# Patient Record
Sex: Male | Born: 1950 | Race: White | Hispanic: No | Marital: Married | Smoking: Never smoker
Health system: Southern US, Community
[De-identification: ages and names within clinical notes are randomized; demographics above are authoritative.]

## PROBLEM LIST (undated history)

## (undated) DIAGNOSIS — B029 Zoster without complications: Secondary | ICD-10-CM

## (undated) DIAGNOSIS — E785 Hyperlipidemia, unspecified: Secondary | ICD-10-CM

## (undated) DIAGNOSIS — Z8709 Personal history of other diseases of the respiratory system: Secondary | ICD-10-CM

## (undated) DIAGNOSIS — Z8619 Personal history of other infectious and parasitic diseases: Secondary | ICD-10-CM

## (undated) DIAGNOSIS — E7800 Pure hypercholesterolemia, unspecified: Secondary | ICD-10-CM

## (undated) DIAGNOSIS — R7303 Prediabetes: Secondary | ICD-10-CM

## (undated) DIAGNOSIS — G8929 Other chronic pain: Secondary | ICD-10-CM

## (undated) DIAGNOSIS — I10 Essential (primary) hypertension: Secondary | ICD-10-CM

## (undated) DIAGNOSIS — M549 Dorsalgia, unspecified: Secondary | ICD-10-CM

## (undated) DIAGNOSIS — M1990 Unspecified osteoarthritis, unspecified site: Secondary | ICD-10-CM

## (undated) DIAGNOSIS — Z87898 Personal history of other specified conditions: Secondary | ICD-10-CM

## (undated) HISTORY — DX: Zoster without complications: B02.9

## (undated) HISTORY — PX: KNEE ARTHROSCOPY: SUR90

## (undated) HISTORY — PX: EPIDURAL BLOCK INJECTION: SHX1516

## (undated) HISTORY — PX: PROSTATE BIOPSY: SHX241

## (undated) HISTORY — PX: ROTATOR CUFF REPAIR: SHX139

## (undated) HISTORY — DX: Personal history of other specified conditions: Z87.898

## (undated) HISTORY — DX: Pure hypercholesterolemia, unspecified: E78.00

## (undated) HISTORY — DX: Prediabetes: R73.03

## (undated) HISTORY — PX: COLONOSCOPY: SHX174

## (undated) HISTORY — DX: Morbid (severe) obesity due to excess calories: E66.01

---

## 1998-09-14 ENCOUNTER — Encounter: Admission: RE | Admit: 1998-09-14 | Discharge: 1998-09-14 | Payer: Self-pay | Admitting: Family Medicine

## 1999-09-27 ENCOUNTER — Encounter: Admission: RE | Admit: 1999-09-27 | Discharge: 1999-09-27 | Payer: Self-pay | Admitting: Family Medicine

## 2000-05-29 ENCOUNTER — Encounter: Admission: RE | Admit: 2000-05-29 | Discharge: 2000-05-29 | Payer: Self-pay | Admitting: Family Medicine

## 2000-05-29 ENCOUNTER — Ambulatory Visit (HOSPITAL_COMMUNITY): Admission: RE | Admit: 2000-05-29 | Discharge: 2000-05-29 | Payer: Self-pay | Admitting: Family Medicine

## 2000-05-31 ENCOUNTER — Ambulatory Visit (HOSPITAL_COMMUNITY): Admission: RE | Admit: 2000-05-31 | Discharge: 2000-05-31 | Payer: Self-pay | Admitting: Family Medicine

## 2000-07-05 ENCOUNTER — Ambulatory Visit (HOSPITAL_BASED_OUTPATIENT_CLINIC_OR_DEPARTMENT_OTHER): Admission: RE | Admit: 2000-07-05 | Discharge: 2000-07-06 | Payer: Self-pay | Admitting: Orthopedic Surgery

## 2002-04-26 ENCOUNTER — Encounter: Admission: RE | Admit: 2002-04-26 | Discharge: 2002-04-26 | Payer: Self-pay | Admitting: Family Medicine

## 2003-05-23 ENCOUNTER — Emergency Department (HOSPITAL_COMMUNITY): Admission: EM | Admit: 2003-05-23 | Discharge: 2003-05-23 | Payer: Self-pay | Admitting: Emergency Medicine

## 2003-05-26 ENCOUNTER — Encounter: Admission: RE | Admit: 2003-05-26 | Discharge: 2003-05-26 | Payer: Self-pay | Admitting: Family Medicine

## 2003-06-02 ENCOUNTER — Encounter: Admission: RE | Admit: 2003-06-02 | Discharge: 2003-06-02 | Payer: Self-pay | Admitting: Family Medicine

## 2003-09-19 ENCOUNTER — Encounter: Admission: RE | Admit: 2003-09-19 | Discharge: 2003-09-19 | Payer: Self-pay | Admitting: Family Medicine

## 2004-05-31 ENCOUNTER — Encounter: Admission: RE | Admit: 2004-05-31 | Discharge: 2004-05-31 | Payer: Self-pay | Admitting: Sports Medicine

## 2004-06-14 ENCOUNTER — Encounter: Admission: RE | Admit: 2004-06-14 | Discharge: 2004-06-14 | Payer: Self-pay | Admitting: Family Medicine

## 2004-06-16 ENCOUNTER — Encounter: Admission: RE | Admit: 2004-06-16 | Discharge: 2004-06-16 | Payer: Self-pay | Admitting: Family Medicine

## 2005-01-14 ENCOUNTER — Ambulatory Visit: Payer: Self-pay | Admitting: Family Medicine

## 2005-07-11 ENCOUNTER — Ambulatory Visit: Payer: Self-pay | Admitting: Family Medicine

## 2007-02-01 DIAGNOSIS — E7800 Pure hypercholesterolemia, unspecified: Secondary | ICD-10-CM | POA: Insufficient documentation

## 2007-02-01 DIAGNOSIS — IMO0002 Reserved for concepts with insufficient information to code with codable children: Secondary | ICD-10-CM | POA: Insufficient documentation

## 2007-02-01 DIAGNOSIS — J309 Allergic rhinitis, unspecified: Secondary | ICD-10-CM | POA: Insufficient documentation

## 2007-02-01 DIAGNOSIS — M1990 Unspecified osteoarthritis, unspecified site: Secondary | ICD-10-CM | POA: Insufficient documentation

## 2007-07-10 ENCOUNTER — Ambulatory Visit (HOSPITAL_COMMUNITY): Admission: RE | Admit: 2007-07-10 | Discharge: 2007-07-11 | Payer: Self-pay | Admitting: Orthopedic Surgery

## 2008-07-15 ENCOUNTER — Encounter: Payer: Self-pay | Admitting: Family Medicine

## 2009-07-20 ENCOUNTER — Telehealth (INDEPENDENT_AMBULATORY_CARE_PROVIDER_SITE_OTHER): Payer: Self-pay | Admitting: *Deleted

## 2011-04-19 NOTE — Op Note (Signed)
NAME:  LAYDEN, CATERINO NO.:  [id number]   MEDICAL RECORD NO.:  [id number]          PATIENT TYPE:  AMB   LOCATION:  SDS                          FACILITY:  [facility]   PHYSICIAN:  Burnard Bunting, M.D.    DATE OF BIRTH:  October 03, 1951   DATE OF PROCEDURE:  07/10/2007  DATE OF DISCHARGE:                               OPERATIVE REPORT   PREOPERATIVE DIAGNOSES:  1. Right shoulder rotator cuff tear.  2. Synovitis in retained biceps stump within the joint.  3. Bursitis.   POSTOPERATIVE DIAGNOSES:  1. Right shoulder degenerative labral tearing with retained biceps      tendon stump.  2. Synovitis.  3. Subacromial bursitis.  4. Tear of the supraspinatus and subscapularis.   PROCEDURE:  Right shoulder diagnostic intraoperative arthroscopy,  extensive debridement of intra-articular space.  Open subscapularis  tendon repair via deltopectoral approach.  Open supraspinatus repair of  the superior deltoid splitting approach.   SURGEON:  Burnard Bunting, M.D.   ASSISTANT:  Lanny Hurst, MD   ANESTHESIA:  General endotracheal.   ESTIMATED BLOOD LOSS:  75.   INDICATIONS:  Michael Hill is a 60 year old patient with right shoulder  supraspinatus and subscapular tear following trauma 3 months ago.  He  also has torn biceps tendon, who presents now for operative management  after explanation of risks and benefits.   OPERATIVE FINDINGS:  1. Examination under anesthesia range of motion external rotation 15      degrees abduction.  His external rotation 90, degrees of adduction      90, internal rotation 90 degrees, adduction is 70. Shoulder      stability is 1+ anterior, posterior with less than 1 cm, sulcus      none.  Operative findings.  2. Diagnostic intraoperative arthroscopy:  a) synovitis without frozen      shoulder appearance, within the intra-articular space with      significant labral fraying and degenerative tearing of the rotator      cuff, supraspinatus and  subscapular,  b)  subacromial bursitis, c)      tear of the subscapula and supraspinatus. The subscapular tear was      involved in the superior 2/3 of the tendon with about a centimeter      to 2 cm of retraction off of the lesser tuberosity, supraspinatus      tear was full thickness with about 2 cm of retraction but the      tendon was mobile.   DESCRIPTION OF PROCEDURE:  The patient was brought to the operating room  where general endotracheal anesthesia was induced.  Preoperative  antibiotics were administered.  The patient was placed in the beach  chair position with the head in neutral position.  The legs were well  padded.  Right shoulder, arm were prepped in DuraPrep solution, draped  in a sterile manner.  The operative anatomy was covered with Ioban.  Operative anatomy of the shoulder was then clamped using the  posterolateral nature margins of the acromion as well as coracoid  process.  Shoulder joint was insufflated with 20  mL of saline.  Subacromial space was insufflated with saline with epinephrine.  The  diagnostic arthroscope was performed through a posterior portal.  Extensive labral fraying, rotator cuff fraying and synovitis were noted.  These were debrided using a large radial shaver including biceps stump  in the frayed labrum and the frayed rotator cuff tear.  Articular  surfaces were intact. Subacromial space showed bursitis.  At this time a  deltopectoral approach was made following identification of the  subscapularis tear.  Skin and subcutaneous tissue were sharply divided.  Fat stripe was identified.  Deltopectoral interval was entered, cephalic  vein was mobilized laterally.  Self-retaining retractor was placed.  Care was taken to avoid retractor placement too distal within the  conjoined tendon. The rotator interval was opened.  The tranverse  humeral ligament was opened. Subscapularis was noted to be detached  along the superior 2/3 surface.  This was  repaired back to repaired bony  bed using two high 5 Corkscrew suture anchors.  Secure repair was  achieved.  Free edges were then secured into the bicipital groove with a  third cork screw anchor.  At this time the incision was thoroughly  irrigated.  Good rotational stability of the rotator cuff tear was  achieved using all 4 strands of both cork screw suture anchor sutures.  The deltopectoral interval was then closed using #1 Vicryl suture after  thorough irrigation.  The incision was closed using interrupted 2-0  Vicryl suture and a running 3-0 pull out  Prolene.  The supraspinatus  tear was next treated.  It was not accessible through the deltopectoral  approach nor was the subscapularis tear full accessible through a  superior approach.  Superior approach was then utilized.  Skin and  subcutaneous tissue were sharply divided.  Deltoid was split a measured  distance of  3.5 cm from the anterolateral margins of the acromion.  Stay suture was placed.  Self-retaining retractor was placed. The  supraspinatus tear was a tear within the tendon intrasubstance itself  with healthy portion of the tendon remaining attached to the greater  tuberosity foot print.  Two suture anchors were placed at the junction  of this tear and the tear was repaired in a water tight fashion using 4  suture strands. The knots were then tapped down using two 3.5 push offs  into the greater tuberosity.  The water tight repair was achieved and  the arm was taken through a range of motion and the tear was found to be  intact.  At this time the incision was thoroughly irrigated.  Deltoid  muscle was closed using #1 Vicryl suture.  The skin was closed using  interrupted inverted 2-0 Vicryl suture and running 3-0 pull out Prolene.  The posterior portal was closed using 3-0 nylon suture.  All incisions  were thoroughly irrigated.  Prior to closure, appropriate stressing was  placed.  Shoulder immobilizer was placed.  The  patient was extubated and  taken to the recovery room.  He tolerated the procedure well without  immediate complications.  It should be noted Dr. Zoila Shutter assistance  was required at all times during this case as a medical necessity  because of the complexity of the case with both rotator cuff tendon  tears as well as the necessity for retraction of important neurovascular  structures.      Burnard Bunting, M.D.  Electronically Signed     GSD/MEDQ  D:  07/10/2007  T:  07/11/2007  Job:  [id number] 

## 2011-04-22 NOTE — Op Note (Signed)
[facility]  Patient:    Michael Hill, Michael Hill                      MRN: [id number] Proc. Date: 07/05/00 Adm. Date:  [id number] Disc. Date: [id number] Attending:  Colbert Ewing                           Operative Report  PREOPERATIVE DIAGNOSIS: 1. Left knee degenerative medial meniscus tear with degenerative joint    disease. 2. Right knee degenerative joint disease with lateral meniscus tear.  POSTOPERATIVE DIAGNOSIS: 1. 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  Discoid lateral meniscus. 2. Right knee degenerative tearing, medial meniscus and lateral meniscus.    Grade 4 degenerative joint disease, lateral compartment.  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  OPERATION PERFORMED: 1. Left knee examination under anesthesia, arthroscopy, partial medial    meniscectomy with chondroplasty patella and medial femoral condyle. 2. Right knee examination under anesthesia, arthroscopy, chondroplasty patella    with partial medial and lateral meniscectomies.  SURGEON:  Loreta Ave, M.D.  ASSISTANT:  Arlys Deaunte D. Petrarca, P.A.-C.  ANESTHESIA:  General.  ESTIMATED BLOOD LOSS:  Minimal.  SPECIMENS:  None.  CULTURES:  None.  COMPLICATIONS:  None.  DRESSING:  Soft compressive.  DESCRIPTION OF PROCEDURE:  Patient was brought to the operating room and after adequate anesthesia had been obtained, both knees examined.  Full motion. Patellofemoral crepitus both sides.  Stable ligaments.  Tourniquet and leg holders were applied both sides.  Both prepped and draped in the usual sterile fashion.  Attention turned first to the left.  Three portals created, one superolateral, one each medial and lateral parapatellar.  Inflow catheter introduced, knee distended, arthroscope introduced and knee inspected in a serial manner.  Grade 3 chondromalacia patella and grade 3  changes medial femoral condyle debrided with a shaver to a stable surface.  ____________ articular cartilage throughout.  Extensive degenerative tearing posterior half of the medial meniscus saucerized out to the rim, tapered in at its remaining meniscus with baskets and shaver.  Lateral compartment had a discoid lateral meniscus without attrition or tears and it was left intact.  No degenerative changes.  Cruciate ligaments intact.  Patellofemoral tracking good.  Loose bodies of articular cartilage removed.  Instruments and fluid removed. Portals of the knee injected with Marcaine.  Portals closed with 4-0 nylon. When this was completed, attention was turned to the opposite right knee which also had been prepped and draped in the usual sterile fashion.  Three portals were created, one superolateral, one each medial and lateral parapatellar. Inflow catheter introduced.  Knee distended.  Arthroscope introduced and knee inspected.  Degenerative tearing medial meniscus, middle third saucerized out retaining a fair amount of meniscus retaining a fair amount of meniscus behind.  Only mild degenerative changes seen in the compartment but there was significant periarticular spurring.  Cruciate ligaments intact.  Lateral compartment had had extensive previous work with a rim of meniscus all the way around with extensive degenerative tearing throughout that rim.  Taken all the way out to the remaining rim removing all loose fragmented portions.  Grade 4 changes with exposed bone over the majority of the compartment from previous transgression and degenerative joint disease.  Loose fragments removed. Patellofemoral joint had grade 3 changes with chondromalacia and multiple chondral flaps  on the patella and troclea.  All of this tapered down smoothly, removing all chondral flaps, treated with chondroplasty, leaving a reasonable bed of articular cartilage throughout.  Again, cruciate ligaments intact.   All loose fragments removed.  Partially tethered loose bodies anteriorly also removed.  The entire knee examined, no other significant findings appreciated. Instruments and fluid removed.  Portals of the knee injected with Marcaine. Portals closed with 4-0 nylon.  Sterile compressive dressing applied to both legs.  Anesthesia reversed.  Brought to recovery room.  Tolerated surgery well.  No complications. DD:  07/05/00 TD:  07/07/00 Job: [id number]

## 2011-09-19 LAB — BASIC METABOLIC PANEL
CO2: 24
Calcium: 8.9
GFR calc Af Amer: >60
GFR calc non Af Amer: >60
Potassium: 4
Sodium: 138

## 2011-09-19 LAB — CBC
Hemoglobin: 13.9
RBC: 4.64

## 2012-01-23 ENCOUNTER — Other Ambulatory Visit: Payer: Self-pay | Admitting: Family Medicine

## 2012-01-23 DIAGNOSIS — M545 Low back pain, unspecified: Secondary | ICD-10-CM

## 2012-01-30 ENCOUNTER — Ambulatory Visit
Admission: RE | Admit: 2012-01-30 | Discharge: 2012-01-30 | Disposition: A | Discharge status: Home / Self Care | Payer: BC Managed Care – PPO | Source: Ambulatory Visit | Attending: Family Medicine | Admitting: Family Medicine

## 2012-01-30 DIAGNOSIS — M545 Low back pain, unspecified: Secondary | ICD-10-CM

## 2014-06-18 ENCOUNTER — Other Ambulatory Visit: Payer: Self-pay | Admitting: Neurosurgery

## 2014-06-19 ENCOUNTER — Other Ambulatory Visit: Payer: Self-pay

## 2014-06-19 ENCOUNTER — Telehealth: Payer: Self-pay | Admitting: Vascular Surgery

## 2014-06-19 NOTE — Telephone Encounter (Signed)
Message copied by Fredrich BirksMILLIKAN, DANA P on Thu Jun 19, 2014  2:59 PM ------      Message from: Phillips OdorPULLINS, CAROL S      Created: Thu Jun 19, 2014  1:19 PM      Regarding: needs new patient consult/ [facility]       Please schedule this pt. with [facility] for new pt. Consult, prior to ALIF on 08/05/14; please remind him to bring a copy of the LS spine film with him to the appt.  Thanks.  ------

## 2014-06-19 NOTE — Telephone Encounter (Signed)
Lm for pt to cb for appt information, dpm

## 2014-07-22 ENCOUNTER — Encounter: Payer: Self-pay | Admitting: Vascular Surgery

## 2014-07-23 ENCOUNTER — Encounter: Payer: Self-pay | Admitting: Vascular Surgery

## 2014-07-23 ENCOUNTER — Ambulatory Visit (INDEPENDENT_AMBULATORY_CARE_PROVIDER_SITE_OTHER): Payer: BC Managed Care – PPO | Admitting: Vascular Surgery

## 2014-07-23 VITALS — BP 138/86 | HR 69 | Ht 67.0 in | Wt 215.4 lb

## 2014-07-23 DIAGNOSIS — M5136 Other intervertebral disc degeneration, lumbar region: Secondary | ICD-10-CM | POA: Insufficient documentation

## 2014-07-23 DIAGNOSIS — M5137 Other intervertebral disc degeneration, lumbosacral region: Secondary | ICD-10-CM

## 2014-07-23 NOTE — Assessment & Plan Note (Signed)
The patient appears to be a good candidate for anterior retroperitoneal exposure of L5-S1. I have reviewed our role in exposure of the spine in order to allow anterior lumbar interbody fusion at the appropriate levels. We have discussed the potential complications of surgery, including but not limited to, arterial or venous injury, thrombosis, or bleeding. We have also discussed the potential risks of wound healing problems, the development of a hernia, nerve injury, leg swelling, or other unpredictable medical problems. I've also explained that for the L5-S1 level there is a small risk of retrograde ejaculation. All the patient's questions were answered and they are agreeable to proceed. The surgery is scheduled for 08/05/2014.

## 2014-07-23 NOTE — Progress Notes (Signed)
Patient ID: Michael Hill, male   DOB: [birth year], 63 y.o.   MRN: [id number]  Reason for Consult: Evaluate for ALIF. Referred by Dr. Maeola Harman   Referred by No ref. provider found  Subjective:     HPI:  Michael Hill is a 63 y.o. male who has a long history of back pain. He had multiple sports injuries when he was younger. Approximately 2 years ago he injured his back lifting something and has had significant back pain since that time. He has tried physical therapy injection therapy and medications without significant relief. I've been asked to provide anterior retroperitoneal exposure of L5-S1. Dr. Venetia Maxon plans on the dressing multiple levels from a different approach also.  Patient's risk factors for vascular disease and hypertension and hypercholesterolemia. In addition he has a family history of vascular disease. He does not smoke tobacco.  History reviewed. No pertinent past medical history. Family History  Problem Relation Age of Onset  . Heart disease Father     before age 2  . Hyperlipidemia Father   . Hypertension Father   . Hypertension Brother   . Heart disease Brother     before age 63  . Cancer Daughter    History reviewed. No pertinent past surgical history.  Short Social History:  History  Substance Use Topics  . Smoking status: Never Smoker   . Smokeless tobacco: Never Used  . Alcohol Use: No   No Known Allergies  Current Outpatient Prescriptions  Medication Sig Dispense Refill  . amLODipine-benazepril (LOTREL) 5-10 MG per capsule Take 1 capsule by mouth daily.      Marland Kitchen HYDROcodone-acetaminophen (NORCO/VICODIN) 5-325 MG per tablet Take 1 tablet by mouth every 8 (eight) hours.       No current facility-administered medications for this visit.   Review of Systems  Constitutional: Negative for chills and fever.  Eyes: Negative for loss of vision.  Respiratory: Negative for cough and wheezing.  Cardiovascular: Negative for chest pain, chest  tightness, claudication, dyspnea with exertion, orthopnea and palpitations.  GI: Negative for blood in stool and vomiting.  GU: Negative for dysuria and hematuria.  Musculoskeletal: Negative for leg pain, joint pain and myalgias.  Skin: Negative for rash and wound.  Neurological: Negative for dizziness and speech difficulty.  Hematologic: Negative for bruises/bleeds easily. Psychiatric: Negative for depressed mood.       Objective:  Objective  Filed Vitals:   07/23/14 0904  BP: 138/86  Pulse: 69  Height:  (1.702 m)  Weight: 215 lb 6.4 oz (97.705 kg)  SpO2: 97%   Body mass index is 33.73 kg/(m^2).  Physical Exam  Constitutional: He is oriented to person, place, and time. He appears well-developed and well-nourished.  HENT:  Head: Normocephalic and atraumatic.  Neck: Neck supple. No JVD present. No thyromegaly present.  Cardiovascular: Normal rate, regular rhythm and normal heart sounds.  Exam reveals no friction rub.   No murmur heard. Pulses:      Femoral pulses are 2+ on the right side, and 2+ on the left side.      Popliteal pulses are 2+ on the right side, and 2+ on the left side.  Pulmonary/Chest: Breath sounds normal. He has no wheezes. He has no rales.  Abdominal: Soft. Bowel sounds are normal. There is no tenderness.  I do not palpate an aneurysm.  Musculoskeletal: Normal range of motion. He exhibits no edema.  Lymphadenopathy:    He has no cervical adenopathy.  Neurological: He is  alert and oriented to person, place, and time. He has normal strength. No sensory deficit.  Skin: No lesion and no rash noted.  Psychiatric: He has a normal mood and affect.   Data: I reviewed his previous MRI. This was in 2013.     Assessment/Plan:    DDD (degenerative disc disease), lumbar The patient appears to be a good candidate for anterior retroperitoneal exposure of L5-S1. I have reviewed our role in exposure of the spine in order to allow anterior lumbar interbody  fusion at the appropriate levels. We have discussed the potential complications of surgery, including but not limited to, arterial or venous injury, thrombosis, or bleeding. We have also discussed the potential risks of wound healing problems, the development of a hernia, nerve injury, leg swelling, or other unpredictable medical problems. I've also explained that for the L5-S1 level there is a small risk of retrograde ejaculation. All the patient's questions were answered and they are agreeable to proceed. The surgery is scheduled for 08/05/2014.   Chuck Hint MD [facility]

## 2014-07-24 ENCOUNTER — Encounter (HOSPITAL_COMMUNITY): Payer: Self-pay | Admitting: Pharmacy Technician

## 2014-07-28 ENCOUNTER — Encounter (HOSPITAL_COMMUNITY)
Admission: RE | Admit: 2014-07-28 | Discharge: 2014-07-28 | Disposition: A | Discharge status: Home / Self Care | Payer: BC Managed Care – PPO | Source: Ambulatory Visit | Attending: Neurosurgery | Admitting: Neurosurgery

## 2014-07-28 ENCOUNTER — Other Ambulatory Visit (HOSPITAL_COMMUNITY): Payer: BC Managed Care – PPO

## 2014-07-28 ENCOUNTER — Ambulatory Visit (HOSPITAL_COMMUNITY)
Admission: RE | Admit: 2014-07-28 | Discharge: 2014-07-28 | Disposition: A | Discharge status: Home / Self Care | Payer: BC Managed Care – PPO | Source: Ambulatory Visit | Attending: Anesthesiology | Admitting: Anesthesiology

## 2014-07-28 ENCOUNTER — Encounter (HOSPITAL_COMMUNITY): Payer: Self-pay

## 2014-07-28 DIAGNOSIS — I10 Essential (primary) hypertension: Secondary | ICD-10-CM | POA: Insufficient documentation

## 2014-07-28 HISTORY — DX: Personal history of other infectious and parasitic diseases: Z86.19

## 2014-07-28 HISTORY — DX: Personal history of other diseases of the respiratory system: Z87.09

## 2014-07-28 HISTORY — DX: Essential (primary) hypertension: I10

## 2014-07-28 HISTORY — DX: Dorsalgia, unspecified: M54.9

## 2014-07-28 HISTORY — DX: Other chronic pain: G89.29

## 2014-07-28 HISTORY — DX: Hyperlipidemia, unspecified: E78.5

## 2014-07-28 LAB — SURGICAL PCR SCREEN
MRSA, PCR: NEGATIVE
Staphylococcus aureus: POSITIVE — AB

## 2014-07-28 LAB — BASIC METABOLIC PANEL
ANION GAP: 12 (ref 5–15)
BUN: 24 mg/dL — ABNORMAL HIGH (ref 6–23)
CHLORIDE: 106 meq/L (ref 96–112)
CO2: 24 mEq/L (ref 19–32)
Calcium: 9.3 mg/dL (ref 8.4–10.5)
Creatinine, Ser: 0.99 mg/dL (ref 0.50–1.35)
GFR calc Af Amer: >90 mL/min (ref >90)
GFR calc non Af Amer: 86 mL/min — ABNORMAL LOW (ref >90)
GLUCOSE: 113 mg/dL — AB (ref 70–99)
POTASSIUM: 4.7 meq/L (ref 3.7–5.3)
SODIUM: 142 meq/L (ref 137–147)

## 2014-07-28 LAB — CBC
HEMATOCRIT: 42.5 % (ref 39.0–52.0)
HEMOGLOBIN: 14.7 g/dL (ref 13.0–17.0)
MCH: 30.2 pg (ref 26.0–34.0)
MCHC: 34.6 g/dL (ref 30.0–36.0)
MCV: 87.4 fL (ref 78.0–100.0)
Platelets: 200 10*3/uL (ref 150–400)
RBC: 4.86 MIL/uL (ref 4.22–5.81)
RDW: 12.5 % (ref 11.5–15.5)
WBC: 4.8 10*3/uL (ref 4.0–10.5)

## 2014-07-28 LAB — TYPE AND SCREEN
ABO/RH(D): A POS
ANTIBODY SCREEN: NEGATIVE

## 2014-07-28 LAB — ABO/RH: ABO/RH(D): A POS

## 2014-07-28 MED ORDER — CHLORHEXIDINE GLUCONATE 4 % EX LIQD
60.0000 mL | Freq: Once | CUTANEOUS | Status: DC
Start: 2014-07-28 — End: 2014-07-29

## 2014-07-28 MED ORDER — CHLORHEXIDINE GLUCONATE 4 % EX LIQD: 60.0000 mL | Freq: Once | CUTANEOUS | Status: DC

## 2014-07-28 NOTE — Progress Notes (Signed)
Mupirocin ointment Rx called into [facility] in [place] for positive PCR of staph. Left message on pt's voicemail notifying him of positive staph.

## 2014-07-28 NOTE — Pre-Procedure Instructions (Signed)
Michael Hill  07/28/2014   Your procedure is scheduled on:  Tues, Sept 1 @ 7:30 AM  Report to [facility] Entrance A  at 5:30 AM.  Call this number if you have problems the morning of surgery: [phone]   Remember:   Do not eat food or drink liquids after midnight.   Take these medicines the morning of surgery with A SIP OF WATER: Pain Pill(if needed) and Amlodipine-Benazepril(Lotrel)              No Goody's,BC's,Aleve,Aspirin,Ibuprofen,FIsh Oil,or any Herbal Mediations   Do not wear jewelry  Do not wear lotions, powders, or colognes. You may wear deodorant.  Men may shave face and neck.  Do not bring valuables to the hospital.  [facility] is not responsible                  for any belongings or valuables.               Contacts, dentures or bridgework may not be worn into surgery.  Leave suitcase in the car. After surgery it may be brought to your room.  For patients admitted to the hospital, discharge time is determined by your                treatment team.             Special Instructions:  [facility] - Preparing for Surgery  Before surgery, you can play an important role.  Because skin is not sterile, your skin needs to be as free of germs as possible.  You can reduce the number of germs on you skin by washing with CHG (chlorahexidine gluconate) soap before surgery.  CHG is an antiseptic cleaner which kills germs and bonds with the skin to continue killing germs even after washing.  Please DO NOT use if you have an allergy to CHG or antibacterial soaps.  If your skin becomes reddened/irritated stop using the CHG and inform your nurse when you arrive at Short Stay.  Do not shave (including legs and underarms) for at least 48 hours prior to the first CHG shower.  You may shave your face.  Please follow these instructions carefully:   1.  Shower with CHG Soap the night before surgery and the                                morning of Surgery.  2.  If you choose to wash your  hair, wash your hair first as usual with your       normal shampoo.  3.  After you shampoo, rinse your hair and body thoroughly to remove the                      Shampoo.  4.  Use CHG as you would any other liquid soap.  You can apply chg directly       to the skin and wash gently with scrungie or a clean washcloth.  5.  Apply the CHG Soap to your body ONLY FROM THE NECK DOWN.        Do not use on open wounds or open sores.  Avoid contact with your eyes,       ears, mouth and genitals (private parts).  Wash genitals (private parts)       with your normal soap.  6.  Wash thoroughly, paying special attention to  the area where your surgery        will be performed.  7.  Thoroughly rinse your body with warm water from the neck down.  8.  DO NOT shower/wash with your normal soap after using and rinsing off       the CHG Soap.  9.  Pat yourself dry with a clean towel.            10.  Wear clean pajamas.            11.  Place clean sheets on your bed the night of your first shower and do not        sleep with pets.  Day of Surgery  Do not apply any lotions/deoderants the morning of surgery.  Please wear clean clothes to the hospital/surgery center.     Please read over the following fact sheets that you were given: Pain Booklet, Coughing and Deep Breathing, Blood Transfusion Information, MRSA Information and Surgical Site Infection Prevention

## 2014-07-28 NOTE — Progress Notes (Signed)
07/28/14 1433  OBSTRUCTIVE SLEEP APNEA  Have you ever been diagnosed with sleep apnea through a sleep study? No  Do you snore loudly (loud enough to be heard through closed doors)?  0  Do you often feel tired, fatigued, or sleepy during the daytime? 0  Has anyone observed you stop breathing during your sleep? 0  Do you have, or are you being treated for high blood pressure? 1  BMI more than 35 kg/m2? 0  Age over 63 years old? 1  Neck circumference greater than 40 cm/16 inches? 1  Gender: 1  Obstructive Sleep Apnea Score 4  Score 4 or greater  Results sent to PCP

## 2014-07-28 NOTE — Progress Notes (Signed)
Pt doesn't have a cardiologist  Denies ever having an echo/stress test/heart cath  Medical Md is Dr.David Azucena Cecil   Denies EKG or CXR in past yr

## 2014-07-29 NOTE — Progress Notes (Signed)
Anesthesia Chart Review:  Patient is a 63 year old male scheduled for L5-S1 ALIF on 08/05/14 by Dr. Venetia Maxon.  Anterior exposure by Dr. Edilia Bo.  History includes non-smoker, HTN, HLD, chronic back pain. BMI is consistent with obesity. OSA screening score is 4.  PCP is Dr. Tally Joe.   EKG on 07/28/14 showed: SR with PACs, inferior infarct, age undetermined (Q waves in III and aVF).  The interpreting cardiologist did not feel that his EKG was significantly changed when compared to prior preoperative tracing on 07/10/07 (copy printed and placed on chart).  He denied prior echo, stress, or cath.  Preoperative CXR and labs noted.   Patient has no reported MI/CHF, smoking, or DM history. His EKG has been stable since at least 2008.  No CV symptoms documented at his PAT visit.  If no acute changes or new CV symptoms then I would anticipate that he could proceed as planned.  Velna Ochs [facility] Short Stay Center/Anesthesiology Phone [phone] 07/29/2014 1:28 PM

## 2014-08-04 MED ORDER — CEFAZOLIN SODIUM-DEXTROSE 2-3 GM-% IV SOLR
2.0000 g | INTRAVENOUS | Status: AC
  Administered 2014-08-05 (×2): 2 g via INTRAVENOUS
  Filled 2014-08-04: qty 50

## 2014-08-05 ENCOUNTER — Inpatient Hospital Stay (HOSPITAL_COMMUNITY): Payer: BC Managed Care – PPO

## 2014-08-05 ENCOUNTER — Encounter (HOSPITAL_COMMUNITY): Payer: BC Managed Care – PPO | Admitting: Vascular Surgery

## 2014-08-05 ENCOUNTER — Encounter (HOSPITAL_COMMUNITY): Payer: Self-pay | Admitting: *Deleted

## 2014-08-05 ENCOUNTER — Inpatient Hospital Stay (HOSPITAL_COMMUNITY): Payer: BC Managed Care – PPO | Admitting: Anesthesiology

## 2014-08-05 ENCOUNTER — Encounter (HOSPITAL_COMMUNITY)
Admission: RE | Disposition: A | Discharge status: Home Health | Payer: BC Managed Care – PPO | Source: Ambulatory Visit | Attending: Neurosurgery

## 2014-08-05 ENCOUNTER — Inpatient Hospital Stay (HOSPITAL_COMMUNITY)
Admission: RE | Admit: 2014-08-05 | Discharge: 2014-08-11 | DRG: 460 | Disposition: A | Discharge status: Home Health | Payer: BC Managed Care – PPO | Source: Ambulatory Visit | Attending: Neurosurgery | Admitting: Neurosurgery

## 2014-08-05 DIAGNOSIS — M549 Dorsalgia, unspecified: Secondary | ICD-10-CM | POA: Diagnosis present

## 2014-08-05 DIAGNOSIS — M4120 Other idiopathic scoliosis, site unspecified: Secondary | ICD-10-CM | POA: Diagnosis present

## 2014-08-05 DIAGNOSIS — M5136 Other intervertebral disc degeneration, lumbar region: Secondary | ICD-10-CM

## 2014-08-05 DIAGNOSIS — M419 Scoliosis, unspecified: Secondary | ICD-10-CM | POA: Diagnosis present

## 2014-08-05 DIAGNOSIS — E7800 Pure hypercholesterolemia, unspecified: Secondary | ICD-10-CM | POA: Diagnosis present

## 2014-08-05 DIAGNOSIS — R030 Elevated blood-pressure reading, without diagnosis of hypertension: Secondary | ICD-10-CM | POA: Diagnosis present

## 2014-08-05 DIAGNOSIS — M5126 Other intervertebral disc displacement, lumbar region: Secondary | ICD-10-CM | POA: Diagnosis present

## 2014-08-05 DIAGNOSIS — R339 Retention of urine, unspecified: Secondary | ICD-10-CM | POA: Diagnosis present

## 2014-08-05 DIAGNOSIS — M47817 Spondylosis without myelopathy or radiculopathy, lumbosacral region: Principal | ICD-10-CM | POA: Diagnosis present

## 2014-08-05 DIAGNOSIS — M48061 Spinal stenosis, lumbar region without neurogenic claudication: Secondary | ICD-10-CM | POA: Diagnosis not present

## 2014-08-05 HISTORY — PX: ANTERIOR LAT LUMBAR FUSION: SHX1168

## 2014-08-05 HISTORY — PX: ABDOMINAL EXPOSURE: SHX5708

## 2014-08-05 HISTORY — PX: LUMBAR FUSION: SHX111

## 2014-08-05 HISTORY — DX: Unspecified osteoarthritis, unspecified site: M19.90

## 2014-08-05 HISTORY — PX: ANTERIOR LUMBAR FUSION: SHX1170

## 2014-08-05 SURGERY — ANTERIOR LUMBAR FUSION 1 LEVEL
Anesthesia: General | Site: Spine Lumbar | Laterality: Right

## 2014-08-05 MED ORDER — CEFAZOLIN SODIUM 1-5 GM-% IV SOLN
1.0000 g | Freq: Three times a day (TID) | INTRAVENOUS | Status: AC
  Administered 2014-08-05 – 2014-08-06 (×2): 1 g via INTRAVENOUS
  Filled 2014-08-05 (×2): qty 50

## 2014-08-05 MED ORDER — SODIUM CHLORIDE 0.9 % IV SOLN: 250.0000 mL | INTRAVENOUS | Status: DC

## 2014-08-05 MED ORDER — DIPHENHYDRAMINE HCL 50 MG/ML IJ SOLN: 12.5000 mg | Freq: Four times a day (QID) | INTRAMUSCULAR | Status: DC | PRN

## 2014-08-05 MED ORDER — SUFENTANIL CITRATE 50 MCG/ML IV SOLN
INTRAVENOUS | Status: AC
  Filled 2014-08-05: qty 1

## 2014-08-05 MED ORDER — ONDANSETRON HCL 4 MG/2ML IJ SOLN
INTRAMUSCULAR | Status: AC
  Filled 2014-08-05: qty 2

## 2014-08-05 MED ORDER — OXYCODONE-ACETAMINOPHEN 5-325 MG PO TABS
1.0000 | ORAL_TABLET | ORAL | Status: DC | PRN
  Administered 2014-08-07 – 2014-08-08 (×5): 2 via ORAL
  Administered 2014-08-08: 1 via ORAL
  Administered 2014-08-08: 2 via ORAL
  Administered 2014-08-08: 1 via ORAL
  Administered 2014-08-09 – 2014-08-11 (×11): 2 via ORAL
  Filled 2014-08-05 (×3): qty 2
  Filled 2014-08-05: qty 1
  Filled 2014-08-05 (×12): qty 2
  Filled 2014-08-05: qty 1
  Filled 2014-08-05 (×3): qty 2

## 2014-08-05 MED ORDER — ALUM & MAG HYDROXIDE-SIMETH 200-200-20 MG/5ML PO SUSP: 30.0000 mL | Freq: Four times a day (QID) | ORAL | Status: DC | PRN

## 2014-08-05 MED ORDER — LACTATED RINGERS IV SOLN
INTRAVENOUS | Status: DC | PRN
  Administered 2014-08-05 (×3): via INTRAVENOUS

## 2014-08-05 MED ORDER — ROCURONIUM BROMIDE 100 MG/10ML IV SOLN
INTRAVENOUS | Status: DC | PRN
  Administered 2014-08-05: 20 mg via INTRAVENOUS
  Administered 2014-08-05: 10 mg via INTRAVENOUS
  Administered 2014-08-05 (×2): 20 mg via INTRAVENOUS
  Administered 2014-08-05: 60 mg via INTRAVENOUS

## 2014-08-05 MED ORDER — LIDOCAINE HCL (CARDIAC) 20 MG/ML IV SOLN
INTRAVENOUS | Status: AC
  Filled 2014-08-05: qty 5

## 2014-08-05 MED ORDER — PHENYLEPHRINE HCL 10 MG/ML IJ SOLN
10.0000 mg | INTRAVENOUS | Status: DC | PRN
  Administered 2014-08-05: 20 ug/min via INTRAVENOUS

## 2014-08-05 MED ORDER — METHOCARBAMOL 500 MG PO TABS
500.0000 mg | ORAL_TABLET | Freq: Four times a day (QID) | ORAL | Status: DC | PRN
  Administered 2014-08-10 – 2014-08-11 (×2): 500 mg via ORAL
  Filled 2014-08-05 (×2): qty 1

## 2014-08-05 MED ORDER — ONDANSETRON HCL 4 MG/2ML IJ SOLN
4.0000 mg | INTRAMUSCULAR | Status: DC | PRN
  Administered 2014-08-05: 4 mg via INTRAVENOUS
  Filled 2014-08-05: qty 2

## 2014-08-05 MED ORDER — ONDANSETRON HCL 4 MG/2ML IJ SOLN: 4.0000 mg | Freq: Four times a day (QID) | INTRAMUSCULAR | Status: DC | PRN

## 2014-08-05 MED ORDER — THROMBIN 5000 UNITS EX SOLR
CUTANEOUS | Status: DC | PRN
  Administered 2014-08-05 (×2): 5000 [IU] via TOPICAL

## 2014-08-05 MED ORDER — NALOXONE HCL 0.4 MG/ML IJ SOLN: 0.4000 mg | INTRAMUSCULAR | Status: DC | PRN

## 2014-08-05 MED ORDER — ROCURONIUM BROMIDE 50 MG/5ML IV SOLN
INTRAVENOUS | Status: AC
  Filled 2014-08-05: qty 2

## 2014-08-05 MED ORDER — NEOSTIGMINE METHYLSULFATE 10 MG/10ML IV SOLN
INTRAVENOUS | Status: AC
  Filled 2014-08-05: qty 1

## 2014-08-05 MED ORDER — HYDROMORPHONE HCL PF 1 MG/ML IJ SOLN
0.2500 mg | INTRAMUSCULAR | Status: DC | PRN
  Administered 2014-08-05 (×4): 0.5 mg via INTRAVENOUS

## 2014-08-05 MED ORDER — HYDROMORPHONE 0.3 MG/ML IV SOLN
INTRAVENOUS | Status: DC
  Administered 2014-08-05: 21:00:00 via INTRAVENOUS
  Administered 2014-08-06 (×2): 1.8 mg via INTRAVENOUS
  Administered 2014-08-06: 1.5 mg via INTRAVENOUS
  Administered 2014-08-06: 3 mg via INTRAVENOUS
  Administered 2014-08-06: 0.3 mg via INTRAVENOUS
  Administered 2014-08-06: 2.4 mg via INTRAVENOUS
  Administered 2014-08-07: 3.3 mg via INTRAVENOUS
  Filled 2014-08-05 (×3): qty 25

## 2014-08-05 MED ORDER — DOCUSATE SODIUM 100 MG PO CAPS
100.0000 mg | ORAL_CAPSULE | Freq: Two times a day (BID) | ORAL | Status: DC
  Administered 2014-08-05 – 2014-08-11 (×11): 100 mg via ORAL
  Filled 2014-08-05 (×11): qty 1

## 2014-08-05 MED ORDER — PHENOL 1.4 % MT LIQD: 1.0000 | OROMUCOSAL | Status: DC | PRN

## 2014-08-05 MED ORDER — ROCURONIUM BROMIDE 50 MG/5ML IV SOLN
INTRAVENOUS | Status: AC
  Filled 2014-08-05: qty 1

## 2014-08-05 MED ORDER — PROMETHAZINE HCL 25 MG/ML IJ SOLN: 6.2500 mg | INTRAMUSCULAR | Status: DC | PRN

## 2014-08-05 MED ORDER — DIPHENHYDRAMINE HCL 12.5 MG/5ML PO ELIX: 12.5000 mg | ORAL_SOLUTION | Freq: Four times a day (QID) | ORAL | Status: DC | PRN

## 2014-08-05 MED ORDER — GLYCOPYRROLATE 0.2 MG/ML IJ SOLN
INTRAMUSCULAR | Status: DC | PRN
  Administered 2014-08-05: 0.4 mg via INTRAVENOUS
  Administered 2014-08-05: 0.2 mg via INTRAVENOUS

## 2014-08-05 MED ORDER — ONDANSETRON HCL 4 MG/2ML IJ SOLN
INTRAMUSCULAR | Status: DC | PRN
  Administered 2014-08-05: 4 mg via INTRAVENOUS

## 2014-08-05 MED ORDER — PANTOPRAZOLE SODIUM 40 MG IV SOLR
40.0000 mg | Freq: Every day | INTRAVENOUS | Status: DC
  Administered 2014-08-05 – 2014-08-07 (×3): 40 mg via INTRAVENOUS
  Filled 2014-08-05 (×3): qty 40

## 2014-08-05 MED ORDER — KCL IN DEXTROSE-NACL 20-5-0.45 MEQ/L-%-% IV SOLN
INTRAVENOUS | Status: DC
  Administered 2014-08-06: 1000 mL via INTRAVENOUS
  Administered 2014-08-06: 10:00:00 via INTRAVENOUS
  Filled 2014-08-05 (×3): qty 1000

## 2014-08-05 MED ORDER — SENNA 8.6 MG PO TABS
1.0000 | ORAL_TABLET | Freq: Two times a day (BID) | ORAL | Status: DC
  Administered 2014-08-05 – 2014-08-11 (×11): 8.6 mg via ORAL
  Filled 2014-08-05 (×11): qty 1

## 2014-08-05 MED ORDER — HEMOSTATIC AGENTS (NO CHARGE) OPTIME
TOPICAL | Status: DC | PRN
  Administered 2014-08-05: 1 via TOPICAL

## 2014-08-05 MED ORDER — PROPOFOL 10 MG/ML IV BOLUS
INTRAVENOUS | Status: AC
  Filled 2014-08-05: qty 20

## 2014-08-05 MED ORDER — SODIUM CHLORIDE 0.9 % IJ SOLN: 3.0000 mL | INTRAMUSCULAR | Status: DC | PRN

## 2014-08-05 MED ORDER — LIDOCAINE HCL (CARDIAC) 20 MG/ML IV SOLN
INTRAVENOUS | Status: DC | PRN
  Administered 2014-08-05: 100 mg via INTRAVENOUS

## 2014-08-05 MED ORDER — LACTATED RINGERS IV SOLN
INTRAVENOUS | Status: DC | PRN
  Administered 2014-08-05: 07:00:00 via INTRAVENOUS

## 2014-08-05 MED ORDER — EPHEDRINE SULFATE 50 MG/ML IJ SOLN
INTRAMUSCULAR | Status: DC | PRN
  Administered 2014-08-05: 10 mg via INTRAVENOUS
  Administered 2014-08-05: 5 mg via INTRAVENOUS
  Administered 2014-08-05: 10 mg via INTRAVENOUS

## 2014-08-05 MED ORDER — FENTANYL CITRATE 0.05 MG/ML IJ SOLN
INTRAMUSCULAR | Status: AC
  Filled 2014-08-05: qty 5

## 2014-08-05 MED ORDER — MIDAZOLAM HCL 2 MG/2ML IJ SOLN
INTRAMUSCULAR | Status: AC
  Filled 2014-08-05: qty 2

## 2014-08-05 MED ORDER — PROPOFOL INFUSION 10 MG/ML OPTIME
INTRAVENOUS | Status: DC | PRN
  Administered 2014-08-05: 50 ug/kg/min via INTRAVENOUS

## 2014-08-05 MED ORDER — ATORVASTATIN CALCIUM 10 MG PO TABS
10.0000 mg | ORAL_TABLET | Freq: Every day | ORAL | Status: DC
  Administered 2014-08-06 – 2014-08-10 (×5): 10 mg via ORAL
  Filled 2014-08-05 (×5): qty 1

## 2014-08-05 MED ORDER — MENTHOL 3 MG MT LOZG: 1.0000 | LOZENGE | OROMUCOSAL | Status: DC | PRN

## 2014-08-05 MED ORDER — SODIUM CHLORIDE 0.9 % IJ SOLN
3.0000 mL | Freq: Two times a day (BID) | INTRAMUSCULAR | Status: DC
  Administered 2014-08-06 – 2014-08-10 (×7): 3 mL via INTRAVENOUS

## 2014-08-05 MED ORDER — HYDROCODONE-ACETAMINOPHEN 5-325 MG PO TABS
1.0000 | ORAL_TABLET | Freq: Three times a day (TID) | ORAL | Status: DC
Start: 2014-08-05 — End: 2014-08-08
  Administered 2014-08-05 – 2014-08-08 (×9): 1 via ORAL
  Filled 2014-08-05 (×8): qty 1

## 2014-08-05 MED ORDER — MIDAZOLAM HCL 5 MG/5ML IJ SOLN
INTRAMUSCULAR | Status: DC | PRN
  Administered 2014-08-05: 2 mg via INTRAVENOUS

## 2014-08-05 MED ORDER — ARTIFICIAL TEARS OP OINT
TOPICAL_OINTMENT | OPHTHALMIC | Status: AC
  Filled 2014-08-05: qty 3.5

## 2014-08-05 MED ORDER — NEOSTIGMINE METHYLSULFATE 10 MG/10ML IV SOLN
INTRAVENOUS | Status: DC | PRN
  Administered 2014-08-05: 3 mg via INTRAVENOUS

## 2014-08-05 MED ORDER — POLYETHYLENE GLYCOL 3350 17 G PO PACK
17.0000 g | PACK | Freq: Every day | ORAL | Status: DC | PRN
  Administered 2014-08-10: 17 g via ORAL
  Filled 2014-08-05: qty 1

## 2014-08-05 MED ORDER — HYDROCODONE-ACETAMINOPHEN 5-325 MG PO TABS
1.0000 | ORAL_TABLET | ORAL | Status: DC | PRN
Start: 2014-08-05 — End: 2014-08-11
  Administered 2014-08-09 – 2014-08-10 (×2): 2 via ORAL
  Filled 2014-08-05 (×3): qty 2

## 2014-08-05 MED ORDER — FLEET ENEMA 7-19 GM/118ML RE ENEM: 1.0000 | ENEMA | Freq: Once | RECTAL | Status: AC | PRN

## 2014-08-05 MED ORDER — ACETAMINOPHEN 650 MG RE SUPP: 650.0000 mg | RECTAL | Status: DC | PRN

## 2014-08-05 MED ORDER — HYDROCODONE-ACETAMINOPHEN 5-325 MG PO TABS
ORAL_TABLET | ORAL | Status: AC
  Filled 2014-08-05: qty 1

## 2014-08-05 MED ORDER — GLYCOPYRROLATE 0.2 MG/ML IJ SOLN
INTRAMUSCULAR | Status: AC
  Filled 2014-08-05: qty 2

## 2014-08-05 MED ORDER — HYDROMORPHONE HCL PF 1 MG/ML IJ SOLN
INTRAMUSCULAR | Status: AC
  Filled 2014-08-05: qty 2

## 2014-08-05 MED ORDER — BENAZEPRIL HCL 10 MG PO TABS
10.0000 mg | ORAL_TABLET | Freq: Every day | ORAL | Status: DC
  Administered 2014-08-08 – 2014-08-11 (×2): 10 mg via ORAL
  Filled 2014-08-05 (×5): qty 1

## 2014-08-05 MED ORDER — OXYCODONE HCL 5 MG/5ML PO SOLN: 5.0000 mg | Freq: Once | ORAL | Status: DC | PRN

## 2014-08-05 MED ORDER — EPHEDRINE SULFATE 50 MG/ML IJ SOLN
INTRAMUSCULAR | Status: AC
  Filled 2014-08-05: qty 1

## 2014-08-05 MED ORDER — OXYCODONE HCL 5 MG PO TABS: 5.0000 mg | ORAL_TABLET | Freq: Once | ORAL | Status: DC | PRN

## 2014-08-05 MED ORDER — PROPOFOL 10 MG/ML IV BOLUS
INTRAVENOUS | Status: DC | PRN
  Administered 2014-08-05: 190 mg via INTRAVENOUS

## 2014-08-05 MED ORDER — MORPHINE SULFATE 2 MG/ML IJ SOLN
1.0000 mg | INTRAMUSCULAR | Status: DC | PRN
  Administered 2014-08-05 (×2): 2 mg via INTRAVENOUS
  Filled 2014-08-05 (×2): qty 1

## 2014-08-05 MED ORDER — ACETAMINOPHEN 325 MG PO TABS: 650.0000 mg | ORAL_TABLET | ORAL | Status: DC | PRN

## 2014-08-05 MED ORDER — BISACODYL 10 MG RE SUPP: 10.0000 mg | Freq: Every day | RECTAL | Status: DC | PRN

## 2014-08-05 MED ORDER — SODIUM CHLORIDE 0.9 % IJ SOLN
9.0000 mL | INTRAMUSCULAR | Status: DC | PRN
Start: 2014-08-05 — End: 2014-08-08

## 2014-08-05 MED ORDER — GLYCOPYRROLATE 0.2 MG/ML IJ SOLN
INTRAMUSCULAR | Status: AC
  Filled 2014-08-05: qty 1

## 2014-08-05 MED ORDER — AMLODIPINE BESYLATE 5 MG PO TABS
5.0000 mg | ORAL_TABLET | Freq: Every day | ORAL | Status: DC
  Administered 2014-08-05 – 2014-08-11 (×4): 5 mg via ORAL
  Filled 2014-08-05 (×5): qty 1

## 2014-08-05 MED ORDER — 0.9 % SODIUM CHLORIDE (POUR BTL) OPTIME
TOPICAL | Status: DC | PRN
  Administered 2014-08-05: 1000 mL

## 2014-08-05 MED ORDER — SUFENTANIL CITRATE 50 MCG/ML IV SOLN
INTRAVENOUS | Status: DC | PRN
  Administered 2014-08-05: 25 ug via INTRAVENOUS
  Administered 2014-08-05 (×3): 10 ug via INTRAVENOUS
  Administered 2014-08-05: 15 ug via INTRAVENOUS
  Administered 2014-08-05 (×4): 10 ug via INTRAVENOUS

## 2014-08-05 SURGICAL SUPPLY — 117 items
ADH SKN CLS APL DERMABOND .7 (GAUZE/BANDAGES/DRESSINGS)
ADH SKN CLS LQ APL DERMABOND (GAUZE/BANDAGES/DRESSINGS) ×8
APPLIER CLIP 11 MED OPEN (CLIP) ×3
APR CLP MED 11 20 MLT OPN (CLIP) ×2
BAG DECANTER FOR FLEXI CONT (MISCELLANEOUS) IMPLANT
BLADE SURG ROTATE 9660 (MISCELLANEOUS) ×3 IMPLANT
BONE MATRIX OSTEOCEL PRO MED (Bone Implant) ×12 IMPLANT
BUR BARREL STRAIGHT FLUTE 4.0 (BURR) IMPLANT
CAGE BRIGADE 12X38X28-12 (Cage) ×3 IMPLANT
CANISTER SUCT 3000ML (MISCELLANEOUS) ×6 IMPLANT
CLIP APPLIE 11 MED OPEN (CLIP) ×2 IMPLANT
CONT SPEC 4OZ CLIKSEAL STRL BL (MISCELLANEOUS) ×3 IMPLANT
CORENT WIDE 10X22X55 (Orthopedic Implant) ×3 IMPLANT
COROENT WIDE 10X22X55 (Orthopedic Implant) ×2 IMPLANT
COROENT XL-W 10X22X50 (Orthopedic Implant) ×6 IMPLANT
COVER BACK TABLE 24X17X13 BIG (DRAPES) IMPLANT
COVER TABLE BACK 60X90 (DRAPES) ×6 IMPLANT
DERMABOND ADHESIVE PROPEN (GAUZE/BANDAGES/DRESSINGS) ×4
DERMABOND ADVANCED (GAUZE/BANDAGES/DRESSINGS)
DERMABOND ADVANCED .7 DNX12 (GAUZE/BANDAGES/DRESSINGS) IMPLANT
DERMABOND ADVANCED .7 DNX6 (GAUZE/BANDAGES/DRESSINGS) ×8 IMPLANT
DRAPE C-ARM 42X72 X-RAY (DRAPES) ×9 IMPLANT
DRAPE C-ARMOR (DRAPES) ×3 IMPLANT
DRAPE INCISE IOBAN 66X45 STRL (DRAPES) IMPLANT
DRAPE LAPAROTOMY 100X72X124 (DRAPES) ×6 IMPLANT
DRAPE POUCH INSTRU U-SHP 10X18 (DRAPES) ×6 IMPLANT
DRSG OPSITE POSTOP 4X6 (GAUZE/BANDAGES/DRESSINGS) ×9 IMPLANT
DRSG TELFA 3X8 NADH (GAUZE/BANDAGES/DRESSINGS) IMPLANT
DURAPREP 26ML APPLICATOR (WOUND CARE) ×6 IMPLANT
ELECT BLADE 4.0 EZ CLEAN MEGAD (MISCELLANEOUS) ×3
ELECT REM PT RETURN 9FT ADLT (ELECTROSURGICAL) ×6
ELECTRODE BLDE 4.0 EZ CLN MEGD (MISCELLANEOUS) ×2 IMPLANT
ELECTRODE REM PT RTRN 9FT ADLT (ELECTROSURGICAL) ×4 IMPLANT
FELT TEFLON 6X6 (MISCELLANEOUS) IMPLANT
GAUZE SPONGE 4X4 12PLY STRL (GAUZE/BANDAGES/DRESSINGS) IMPLANT
GAUZE SPONGE 4X4 16PLY XRAY LF (GAUZE/BANDAGES/DRESSINGS) IMPLANT
GLOVE BIO SURGEON STRL SZ 6.5 (GLOVE) ×3 IMPLANT
GLOVE BIO SURGEON STRL SZ7 (GLOVE) ×3 IMPLANT
GLOVE BIO SURGEON STRL SZ8 (GLOVE) ×9 IMPLANT
GLOVE BIOGEL PI IND STRL 7.0 (GLOVE) ×8 IMPLANT
GLOVE BIOGEL PI IND STRL 7.5 (GLOVE) ×2 IMPLANT
GLOVE BIOGEL PI IND STRL 8 (GLOVE) ×6 IMPLANT
GLOVE BIOGEL PI IND STRL 8.5 (GLOVE) ×8 IMPLANT
GLOVE BIOGEL PI INDICATOR 7.0 (GLOVE) ×4
GLOVE BIOGEL PI INDICATOR 7.5 (GLOVE) ×1
GLOVE BIOGEL PI INDICATOR 8 (GLOVE) ×3
GLOVE BIOGEL PI INDICATOR 8.5 (GLOVE) ×4
GLOVE ECLIPSE 7.5 STRL STRAW (GLOVE) IMPLANT
GLOVE ECLIPSE 8.0 STRL XLNG CF (GLOVE) ×9 IMPLANT
GLOVE ECLIPSE 8.5 STRL (GLOVE) ×6 IMPLANT
GLOVE EXAM NITRILE LRG STRL (GLOVE) IMPLANT
GLOVE EXAM NITRILE MD LF STRL (GLOVE) IMPLANT
GLOVE EXAM NITRILE XL STR (GLOVE) IMPLANT
GLOVE EXAM NITRILE XS STR PU (GLOVE) IMPLANT
GLOVE SURG SS PI 7.0 STRL IVOR (GLOVE) ×9 IMPLANT
GOWN STRL NON-REIN LRG LVL3 (GOWN DISPOSABLE) IMPLANT
GOWN STRL REUS W/ TWL LRG LVL3 (GOWN DISPOSABLE) ×4 IMPLANT
GOWN STRL REUS W/ TWL XL LVL3 (GOWN DISPOSABLE) ×6 IMPLANT
GOWN STRL REUS W/TWL 2XL LVL3 (GOWN DISPOSABLE) ×12 IMPLANT
GOWN STRL REUS W/TWL LRG LVL3 (GOWN DISPOSABLE) ×6
GOWN STRL REUS W/TWL XL LVL3 (GOWN DISPOSABLE) ×9
INSERT FOGARTY 61MM (MISCELLANEOUS) IMPLANT
INSERT FOGARTY SM (MISCELLANEOUS) IMPLANT
KIT BASIN OR (CUSTOM PROCEDURE TRAY) ×3 IMPLANT
KIT DILATOR XLIF 5 (KITS) ×2 IMPLANT
KIT MAXCESS (KITS) ×3 IMPLANT
KIT NEEDLE NVM5 EMG ELECT (KITS) ×2 IMPLANT
KIT NEEDLE NVM5 EMG ELECTRODE (KITS) ×1
KIT ROOM TURNOVER OR (KITS) ×3 IMPLANT
KIT XLIF (KITS) ×1
LOOP VESSEL MAXI BLUE (MISCELLANEOUS) IMPLANT
LOOP VESSEL MINI RED (MISCELLANEOUS) IMPLANT
NEEDLE HYPO 25X1 1.5 SAFETY (NEEDLE) ×3 IMPLANT
NEEDLE SPNL 18GX3.5 QUINCKE PK (NEEDLE) ×3 IMPLANT
NS IRRIG 1000ML POUR BTL (IV SOLUTION) ×3 IMPLANT
PACK LAMINECTOMY NEURO (CUSTOM PROCEDURE TRAY) ×6 IMPLANT
PAD ARMBOARD 7.5X6 YLW CONV (MISCELLANEOUS) ×18 IMPLANT
SCREW BONE 4.5X22MM (Screw) ×9 IMPLANT
SPONGE INTESTINAL PEANUT (DISPOSABLE) ×9 IMPLANT
SPONGE LAP 18X18 X RAY DECT (DISPOSABLE) ×6 IMPLANT
SPONGE LAP 4X18 X RAY DECT (DISPOSABLE) IMPLANT
SPONGE SURGIFOAM ABS GEL SZ50 (HEMOSTASIS) ×3 IMPLANT
STAPLER SKIN PROX WIDE 3.9 (STAPLE) ×3 IMPLANT
STAPLER VISISTAT 35W (STAPLE) IMPLANT
SUT PROLENE 4 0 RB 1 (SUTURE)
SUT PROLENE 4-0 RB1 .5 CRCL 36 (SUTURE) IMPLANT
SUT PROLENE 5 0 CC1 (SUTURE) IMPLANT
SUT PROLENE 6 0 C 1 30 (SUTURE) IMPLANT
SUT PROLENE 6 0 CC (SUTURE) IMPLANT
SUT SILK 0 TIES 10X30 (SUTURE) ×3 IMPLANT
SUT SILK 2 0 TIES 10X30 (SUTURE) IMPLANT
SUT SILK 2 0 TIES 17X18 (SUTURE)
SUT SILK 2 0SH CR/8 30 (SUTURE) IMPLANT
SUT SILK 2-0 18XBRD TIE BLK (SUTURE) IMPLANT
SUT SILK 3 0 TIES 10X30 (SUTURE) IMPLANT
SUT SILK 3 0SH CR/8 30 (SUTURE) IMPLANT
SUT VIC AB 0 CT1 27 (SUTURE) ×6
SUT VIC AB 0 CT1 27XBRD ANBCTR (SUTURE) ×4 IMPLANT
SUT VIC AB 1 CT1 18XBRD ANBCTR (SUTURE) ×2 IMPLANT
SUT VIC AB 1 CT1 8-18 (SUTURE) ×3
SUT VIC AB 2-0 CT1 18 (SUTURE) ×6 IMPLANT
SUT VIC AB 2-0 CT1 27 (SUTURE) ×3
SUT VIC AB 2-0 CT1 27XBRD (SUTURE) IMPLANT
SUT VIC AB 2-0 CT1 TAPERPNT 27 (SUTURE) ×2 IMPLANT
SUT VIC AB 2-0 CTB1 (SUTURE) IMPLANT
SUT VIC AB 3-0 SH 27 (SUTURE)
SUT VIC AB 3-0 SH 27X BRD (SUTURE) IMPLANT
SUT VIC AB 3-0 SH 8-18 (SUTURE) ×12 IMPLANT
SUT VICRYL 4-0 PS2 18IN ABS (SUTURE) IMPLANT
SYR 20ML ECCENTRIC (SYRINGE) ×3 IMPLANT
TAPE CLOTH 3X10 TAN LF (GAUZE/BANDAGES/DRESSINGS) ×9 IMPLANT
TOWEL OR 17X24 6PK STRL BLUE (TOWEL DISPOSABLE) ×3 IMPLANT
TOWEL OR 17X26 10 PK STRL BLUE (TOWEL DISPOSABLE) ×6 IMPLANT
TRAP SPECIMEN MUCOUS 40CC (MISCELLANEOUS) IMPLANT
TRAY FOLEY CATH 14FRSI W/METER (CATHETERS) IMPLANT
TRAY FOLEY CATH 16FRSI W/METER (SET/KITS/TRAYS/PACK) ×3 IMPLANT
WATER STERILE IRR 1000ML POUR (IV SOLUTION) ×3 IMPLANT

## 2014-08-05 NOTE — Interval H&P Note (Signed)
History and Physical Interval Note:  08/05/2014 7:24 AM  Michael Hill  has presented today for surgery, with the diagnosis of Scoliosis, Stenosis, Spondylosis, Lumbago  The various methods of treatment have been discussed with the patient and family. After consideration of risks, benefits and other options for treatment, the patient has consented to  Procedure(s) with comments: L5-S1 Anterior lumbar interbody fusion with Dr. Edilia Bo for approach; Right L2-3 L3-4 L4-5 Anterior lateral lumbar interbody fusion (N/A) - L5-S1 Anterior lumbar interbody fusion with Dr. Edilia Bo for approach Right L2-3 L3-4 L4-5 Anterior lateral lumbar interbody fusion (Right) - Right L2-3 L3-4 L4-5 Anterior lateral lumbar interbody fusion ABDOMINAL EXPOSURE (N/A) as a surgical intervention .  The patient's history has been reviewed, patient examined, no change in status, stable for surgery.  I have reviewed the patient's chart and labs.  Questions were answered to the patient's satisfaction.     [name]

## 2014-08-05 NOTE — H&P (Signed)
> [address] [place], [place] [address] Phone: [phone]   Patient ID:   [id number] Patient: Michael Hill  Date of Birth: 23-Nov-1951 Visit Type: Office Visit   Date: 06/16/2014 11:30 AM Provider: Danae Orleans. Venetia Maxon MD  This 63 year old  patient was referred by Lavada Mesi.  This 63 year old male presents for Pain.  History of Present Illness: 1.  Pain  Michael Hill visits to discuss planned surgery & be fitted for LSO      Medical/Surgical/Interim History Reviewed, no change.  Last detailed document date:04/22/2014.   Family History: Reviewed, no changes.  Last detailed document: 04/22/2014.   Social History: Reviewed, no changes. Last detailed document date: 04/22/2014.      MEDICATIONS(added, continued or stopped this visit):   Started Medication Directions Instruction Stopped   amlodipine 10 mg tablet take 1 tablet by oral route  every day    05/28/2014 hydrocodone 5 mg-acetaminophen 325 mg tablet take 1 tablet by oral route  every 8 hours as needed for pain    05/05/2014 Valium 10 mg tablet take 1 tablet by oral route  1 hour before MRI      ALLERGIES:  Ingredient Reaction Medication Name Comment  NO KNOWN ALLERGIES     No known allergies.   Vitals Date Temp F BP Pulse Ht In Wt Lb BMI BSA Pain Score  06/16/2014  132/80 74       06/16/2014  130/82 74       06/16/2014  160/84 80 67 218.6 34.24  3/10        IMPRESSION Michael Hill and his wife visit for perioperative teaching and brace fitting. Both anxiously comment on his elevated blood pressure reading by automated device.  They are relieved to find acceptable readings by manual evaluation both arms.  Completed Orders (this encounter) Order Details Reason Side Interpretation Result Initial Treatment Date Region  Dietary management education, guidance, and counseling Eat a well rounded diet high in fruit and vegetables        Hypertension education Follow-up with primary  physician         Assessment/Plan # Detail Type Description   1. Assessment BMI 34.0-34.9,ADULT (V85.34).   Plan Orders Today's instructions / counseling include(s) Dietary management education, guidance, and counseling.       2. Assessment Hypertension, Unspecified (401.9).       3. Assessment Lumbar spinal stenosis (724.02).         Pain Assessment/Treatment Pain Scale: 3/10. Method: Numeric Pain Intensity Scale. Location: back. Onset: 12/05/2012. Duration: varies. Quality: dull, aching, sharp. Pain Assessment/Treatment follow-up plan of care: Patient is to use pain medication as directed.  Fall Risk Plan The patient has not fallen in the last year.  Using models and radiographic studies, the planned procedure was discussed with pt & his wife.  The possibility of a staged procedure was addressed, with ALIF L5-S1 and Right side access Antero-Lateral (XLIF) interbody fusion planned for September 1st.  If appropriate based on duration of those procedures, posterior pedicle screws and rods may be performed under same anesthesia; or may be performed September 3rd in a separate surgery. The ALIF portion will be performed with vascular surgery's assistance.  (Their availability may require adjustment of planned Sept 1st surgery date.) XL LSO fitted & given to pt to bring on surgery date. Pt & wife express disappointment with Sept OR date, but understand scheduling situation & the need to coordinate services. Both verbalize understanding of the  planned procedure, pre-&post-op expectations, and discharge instructions.   Orders: Instruction(s)/Education: Assessment Instruction  401.9 Hypertension education  V85.34 Dietary management education, guidance, and counseling             Provider:  Danae Orleans. Venetia Maxon MD  06/17/2014 11:48 AM Dictation edited by: Oris Drone. Poteat    CC Providers: [facility] [address] [place],  [place]   [address]-   [facility] [address] [place], [place] [address]- ----------------------------------------------------------------------------------------------------------------------------------------------------------------------         Electronically signed by Danae Orleans Venetia Maxon MD on 06/24/2014 04:31 PM  > [address] [place], [place] [address] Phone: [phone]   Patient ID:   [id number] Patient: Michael Hill  Date of Birth: June 24, 1951 Visit Type: Office Visit   Date: 05/28/2014 08:30 AM Provider: Danae Orleans. Venetia Maxon MD   This 63 year old male presents for Follow Up of back pain.  History of Present Illness: 1.  Follow Up of back pain  Patient returns with his wife to review his imaging studies.  These show significant medial convex lumbar scoliosis L2-3, L3 L4, L4-L5, L5-S1 levels.  The patient remains flexed forward.  I have not had the EMG to calculate sagittal balance but I believe that he is out of balance.  We discussed specifics of scoliosis surgery and I went over models with the patient.  I reviewed his MRI and plain radiographs.  I believe he will need to undergo excellent approach from the right at the L2-3, L3 L4, L4-L5 levels with possibly a lift at L5-S1 and posterior pedicle screw fixation for management of his principally lumbar scoliosis.  I told the patient I would make further conditions after studying his films making calculations.  Assuming this is a case we will talk about staging the surgery and we'll plan on doing so in the relatively near future.  The patient will be fitted for LSO brace and return to see [facility] for teaching prior to surgery.  She is miserable and is done everything he can from a nonsurgical perspective.  I gave him a prescription for 90 hydrocodone.      Medical/Surgical/Interim History Reviewed, no change.  Last detailed document date:04/22/2014.   PAST MEDICAL HISTORY, SURGICAL  HISTORY, FAMILY HISTORY, SOCIAL HISTORY AND REVIEW OF SYSTEMS I have reviewed the patient's past medical, surgical, family and social history as well as the comprehensive review of systems as included on the [facility] history form dated 04/22/2014, which I have signed.  Family History: Reviewed, no changes.  Last detailed document: 04/22/2014.  Patient reports there is no relevant family history.   Social History: Tobacco use reviewed. Reviewed, no changes. Last detailed document date: 04/22/2014.      MEDICATIONS(added, continued or stopped this visit):   Started Medication Directions Instruction Stopped   amlodipine 10 mg tablet take 1 tablet by oral route  every day     hydrocodone 5 mg-acetaminophen 325 mg tablet take 1 tablet by oral route  every 6 hours as needed for pain  05/28/2014  05/28/2014 hydrocodone 5 mg-acetaminophen 325 mg tablet take 1 tablet by oral route  every 8 hours as needed for pain    05/05/2014 Valium 10 mg tablet take 1 tablet by oral route  1 hour before MRI      ALLERGIES:  Ingredient Reaction Medication Name Comment  NO KNOWN ALLERGIES     No known allergies.  Vitals Date Temp F BP Pulse Ht In Wt Lb BMI BSA Pain Score  05/28/2014  168/80 72 67 218 34.14  2/10      DIAGNOSTIC RESULTS Diagnostic report text  CLINICAL DATA: Back injury. Back and bilateral leg pain.  EXAM: MRI LUMBAR SPINE WITHOUT CONTRAST  TECHNIQUE: Multiplanar, multisequence Michael imaging of the lumbar spine was performed. No intravenous contrast was administered.  COMPARISON: Lumbar spine MRI 01/30/2012  FINDINGS: Degenerative lumbar spondylosis with multilevel disc disease and facet disease. Findings are progressive when compared to the prior MRI. The alignment is stable. Stable scoliosis. The vertebral bodies demonstrate endplate reactive changes at multiple levels. No acute fracture.  Advanced multilevel facet disease but no  definite pars defects. The conus medullaris terminates at the top of L1.  No significant paraspinal or retroperitoneal findings.  T12-L1: Advanced degenerative disc disease with endplate reactive changes. There is a bulging degenerated annulus and osteophytic ridging with mild mass effect on the ventral thecal sac. No significant foraminal stenosis.  L1-2: Mild bulging annulus and osteophytic ridging with mild bilateral lateral recess stenosis, right greater than left. Mild foraminal encroachment bilaterally also, left greater than right.  L2-3: Mild annular bulge and moderate facet disease but no significant spinal stenosis. There is mild bilateral lateral recess encroachment, right greater than left. No significant foraminal stenosis.  L3-4: Advanced degenerative disc disease with a bulging degenerated annulus, osteophytic ridging and facet disease contributing to mild spinal and moderate bilateral lateral recess stenosis. There is also moderate right foraminal stenosis.  L4-5: Advanced degenerative disc disease and facet disease. There is a small central disc protrusion and diffuse bulging annulus. No significant spinal stenosis. There is mild to moderate bilateral lateral recess stenosis and left greater than right foraminal stenosis.  L5-S1: Focal central disc protrusion and bulging degenerated annulus. There is also moderate facet disease. No significant spinal stenosis. Mild foraminal stenosis bilaterally.  IMPRESSION: Degenerative lumbar spondylosis with multilevel disc disease and facet disease. Findings are slightly progressive when compared to the prior examination. There is mild multilevel spinal, lateral recess and foraminal stenosis as discussed above at the individual levels.   Electronically Signed By: Loralie Champagne M.D. On: 05/08/2014 16:07    IMPRESSION Lumbar scoliosis with intractable low back pain and bilateral lower extremity radicular  pain  Completed Orders (this encounter) Order Details Reason Side Interpretation Result Initial Treatment Date Region  Lifestyle education regarding diet Encouraged to eat a well balanced diet and follow up with primary care physician.        Hypertension education Continue to monitor blood pressure. If remains elevated, contact primary care physician.         Assessment/Plan # Detail Type Description   1. Assessment Lumbar scoliosis (737.30).       2. Assessment Lumbar spinal stenosis (724.02).       3. Assessment Lumbar spondylosis (721.3).       4. Assessment Lumbago (724.2).       5. Assessment BMI 34.0-34.9,ADULT (V85.34).   Plan Orders Today's instructions / counseling include(s) Lifestyle education regarding diet.       6. Assessment Abnormal findings, elevated BP w/o HTN (796.2).         Pain Assessment/Treatment Pain Scale: 2/10. Method: Numeric Pain Intensity Scale. Location: back. Onset: 12/05/2012. Duration: varies. Quality: burning, aching, sharp. Pain Assessment/Treatment follow-up plan of care: Patient currently taking pain medication as prescribed..  Reconstructive surgery in the near future  Orders: Diagnostic Procedures: Assessment Procedure  737.30 Return to Clinic  with PA  737.30 XLIF - L2-L3 - L3-L4 - L4-L5 - right, ALIF l 5 S 1  Instruction(s)/Education: Assessment Instruction  796.2 Hypertension education  V85.34 Lifestyle education regarding diet    MEDICATIONS PRESCRIBED TODAY    Rx Quantity Refills  HYDROCODONE-ACETAMINOPHEN 5 mg-325 mg  90 0            Provider:  Danae Orleans. Venetia Maxon MD  05/28/2014 10:30 AM Dictation edited by: Danae Orleans. Venetia Maxon    CC Providers: [facility] [address] [place],  [place]  [address]-   [facility] [address] [place], [place]  [address]- ----------------------------------------------------------------------------------------------------------------------------------------------------------------------         Electronically signed by Danae Orleans Venetia Maxon MD on 05/28/2014 10:31 AM

## 2014-08-05 NOTE — Anesthesia Preprocedure Evaluation (Signed)
Anesthesia Evaluation  Patient identified by MRN, date of birth, ID band Patient awake    Reviewed: Allergy & Precautions, H&P , NPO status , Patient's Chart, lab work & pertinent test results  History of Anesthesia Complications Negative for: history of anesthetic complications  Airway       Dental   Pulmonary neg pulmonary ROS,          Cardiovascular hypertension,     Neuro/Psych negative neurological ROS     GI/Hepatic negative GI ROS, Neg liver ROS,   Endo/Other  negative endocrine ROS  Renal/GU negative Renal ROS     Musculoskeletal  (+) Arthritis -,   Abdominal   Peds  Hematology   Anesthesia Other Findings   Reproductive/Obstetrics                           Anesthesia Physical Anesthesia Plan  ASA: II  Anesthesia Plan: General   Post-op Pain Management:    Induction: Intravenous  Airway Management Planned: Oral ETT  Additional Equipment: Arterial line  Intra-op Plan:   Post-operative Plan: Extubation in OR  Informed Consent: I have reviewed the patients History and Physical, chart, labs and discussed the procedure including the risks, benefits and alternatives for the proposed anesthesia with the patient or authorized representative who has indicated his/her understanding and acceptance.   Dental advisory given  Plan Discussed with: CRNA and Surgeon  Anesthesia Plan Comments:         Anesthesia Quick Evaluation

## 2014-08-05 NOTE — Op Note (Signed)
    NAME: Michael Hill   MRN: [id number] DOB: 05/01/1951    DATE OF OPERATION: 08/05/2014  PREOP DIAGNOSIS: Degenerative disc disease  POSTOP DIAGNOSIS: Same  PROCEDURE: Anterior retroperitoneal exposure of L5-S1  EXPOSURE SURGEON: Di Kindle. Edilia Bo, MD  SPINE SURGEON: Maeola Harman M.D.  ASSIST: Glori Bickers, NP  ANESTHESIA: general   EBL: minimal  INDICATIONS: GAVYN YBARRA is a 63 y.o. male with significant degenerative disc disease. I was asked to provide anterior retroperitoneal exposure of L5-S1  FINDINGS: degenerative disc disease L5-S1  TECHNIQUE: The patient was taken to the operating room and received a general anesthetic. Pulse oximeter was placed on the left foot. The abdomen was prepped and draped in the usual sterile fashion. I had marked the level of the L5-S1 disc under fluoroscopy. A transverse incision was made to the left of the midline at the marked level. The dissection was carried down to the anterior rectus fascia. This was opened transversely. A the anterior rectus fascia was mobilized superiorly and inferiorly. The rectus abdominis muscle was then mobilized circumferentially. The muscle was retracted to the patient's right. The retroperitoneal space was entered and dissection carried down to the psoas muscle. Dissection was then carried medial to the common iliac artery and the sacral promontory was identified. The middle sacral vessels were cauterized and using blunt dissection the L5-S1 disc space was exposed. The Thompson retractor was placed. Using the reverse lip retractors the L5-S1 disc space was marked and then fluoroscopy was used to confirm the correct position. The remainder of the procedure is as dictated by Dr. Venetia Maxon.   Waverly Ferrari, MD, FACS [facility]  DATE OF DICTATION:   08/05/2014

## 2014-08-05 NOTE — Progress Notes (Signed)
Awake, alert, conversant.  MAEW with good power.  Doing well.  

## 2014-08-05 NOTE — Interval H&P Note (Signed)
History and Physical Interval Note:  08/05/2014 7:19 AM  Michael Hill  has presented today for surgery, with the diagnosis of Scoliosis, Stenosis, Spondylosis, Lumbago  The various methods of treatment have been discussed with the patient and family. After consideration of risks, benefits and other options for treatment, the patient has consented to  Procedure(s) with comments: L5-S1 Anterior lumbar interbody fusion with Dr. Edilia Bo for approach; Right L2-3 L3-4 L4-5 Anterior lateral lumbar interbody fusion (N/A) - L5-S1 Anterior lumbar interbody fusion with Dr. Edilia Bo for approach Right L2-3 L3-4 L4-5 Anterior lateral lumbar interbody fusion (Right) - Right L2-3 L3-4 L4-5 Anterior lateral lumbar interbody fusion ABDOMINAL EXPOSURE (N/A) as a surgical intervention .  The patient's history has been reviewed, patient examined, no change in status, stable for surgery.  I have reviewed the patient's chart and labs.  Questions were answered to the patient's satisfaction.     [name]

## 2014-08-05 NOTE — Progress Notes (Signed)
Pt still continued to have severe pain from back and surgical site. Gave max dose of MSO4 IV with no relief and pt showing restlessness. Called on call MD- Dr Danielle Dess, and new orders received for pain management. Spouse and patient aware of med changes. Spouse at bedside and supportive.

## 2014-08-05 NOTE — Op Note (Signed)
08/05/2014  12:54 PM  PATIENT:  Michael Hill  63 y.o. male  PRE-OPERATIVE DIAGNOSIS:  Scoliosis, Stenosis, Spondylosis, Lumbago L 23, L 34, L 45, L 5 S1 levels  POST-OPERATIVE DIAGNOSIS:  Scoliosis, Stenosis, Spondylosis, Lumbago L 23, L 34, L 45, L 5 S1 levels  PROCEDURE:  Procedure(s) with comments: Lumbar five-Sacral-One Anterior lumbar interbody fusion with Dr. Edilia Bo for approach; Right Lumbar two-three,Lumbar three-four,Lumbar four-five Anterior lateral lumbar interbody fusion (N/A) Right Lumbar two-three,Lumbar three-four,Lumbar four-five Anterior lateral lumbar interbody fusion (Right) - right  ABDOMINAL EXPOSURE (N/A)  SURGEON:  Surgeon(s) and Role: Panel 1:    * Maeola Harman, MD - Primary    * Barnett Abu, MD - Assisting  Panel 2:    * Chuck Hint, MD - Primary  PHYSICIAN ASSISTANT:   ASSISTANTS: Poteat, RN  ANESTHESIA:   general  EBL:  Total I/O In: 2000 [I.V.:2000] Out: 565 [Urine:415; Blood:150]  BLOOD ADMINISTERED:none  DRAINS: none   LOCAL MEDICATIONS USED:  LIDOCAINE   SPECIMEN:  No Specimen  DISPOSITION OF SPECIMEN:  N/A  COUNTS:  YES  TOURNIQUET:  * No tourniquets in log *  DICTATION: INDICATIONS:  Pateint is 63 year old male with chronic and intractable back and bilateral lower extremity pain with scoliosis, stenosis, disc degeneration and spondylosis at the L 23, L 34, L 45, L 5 S 1 level.  It was elected to take him to surgery for anterior lumbar decompression and fusion at the L 5 S1 level, anterolateral decompression and fusion L 23, L 34, L 45 levels as Stage I and planned L 2 - S 1 percutaneous pedicle screw fixation to be performed as a planned second stage.  PROCEDURE:  Doctor Durwin Nora performed exposure and his portion of the procedure will be dictated separately.  Upon exposing the L 5 S1 level, a localizing X ray was obtained with the C arm.  I then incised the anterior annulus and performed a thorough discectomy.  The  endplates were cleared of disc and cartilagenous material and a thorough discectomy was performed with decompression of the ventral annulus and disc material.  After trial, a 12 degree lordotic x 38 x 34 mm Nuvasive PEEK spacer was selected, packed with Osteocel plus.  The implant was tamped into position and positioning was confirmed with C arm.  22.5 x 6 mm screws were then placed through the implant into the L 5 and S 1 vertebrae.  Locking mechanisms were engaged, soft tissues were inspected and found to be in good repair.  Retractors were removed.  Fascia was closed with 0 vicryl running stitch, skin edges closed with 2-0 and 3-0 vicryl sutures.  Wound was dressed with a sterile occlusive dressing.  Patient was then  placed in a left lateral decubitus position on the operative table and using orthogonally projected C-arm fluoroscopy the patient was placed so that the L2-3 L3-4 and L4-5 levels were visualized in AP and lateral plane. The patient was then taped into position. The table was flexed so as to expose the L4-5 level as the patient has a high iliac crest. Skin was marked along with a posterior finger dissection incision. His flank was then prepped and draped in usual sterile fashion and incisions were made sequentially at L4-5 L3-4 and L2-3 levels. Posterior finger dissection was made to enter the retroperitoneal space and then subsequently the probe was inserted into the psoas muscle from the left side initially at the L4-5 level. After mapping the neural elements  were able to dock the probe per the midpoint of this vertebral level and without indications electrically of too close proximity to the neural tissues. Subsequently the self-retaining tractor was.after sequential dilators were utilized the shim was employed and the interspace was cleared of psoas muscle and then incised. A thorough discectomy was performed. Angled instruments were used to clear the interspace of disc material. After thorough  discectomy was performed and this was performed using AP and lateral fluoroscopy a 10 lordotic by 55 x 22 mm implant was packed with Osteocel plus. This was tamped into position using the slides and its position was confirmed on AP and lateral fluoroscopy. Subsequently exposure was performed at the L3-4 level and similar dissection was performed with locking of the self-retaining retractor. At this level were able to place a 10 lordotic by 22 x 50 mm implant packed in a similar fashion. At the L2-3 level were able to place an 10 lordotic by 50 x 22 mm implant packed in a similar fashion. Hemostasis was assured the wounds were irrigated interrupted Vicryl sutures.  Sterile occlusive dressing was placed with Dermabond. The patient was then extubated in the operating room and taken to recovery in stable and satisfactory condition having tolerated his operation well. Counts were correct at the end of the case.   PLAN OF CARE: Admit to inpatient   PATIENT DISPOSITION:  PACU - hemodynamically stable.   Delay start of Pharmacological VTE agent (>24hrs) due to surgical blood loss or risk of bleeding: yes

## 2014-08-05 NOTE — Transfer of Care (Signed)
Immediate Anesthesia Transfer of Care Note  Patient: Michael Hill  Procedure(s) Performed: Procedure(s) with comments: Lumbar five-Sacral-One Anterior lumbar interbody fusion with Dr. Edilia Bo for approach; Right Lumbar two-three,Lumbar three-four,Lumbar four-five Anterior lateral lumbar interbody fusion (N/A) Right Lumbar two-three,Lumbar three-four,Lumbar four-five Anterior lateral lumbar interbody fusion (Right) - right  ABDOMINAL EXPOSURE (N/A)  Patient Location: PACU  Anesthesia Type:General  Level of Consciousness: awake, patient cooperative and confused  Airway & Oxygen Therapy: Patient Spontanous Breathing and Patient connected to face mask oxygen  Post-op Assessment: Report given to PACU RN, Post -op Vital signs reviewed and stable and Patient moving all extremities  Post vital signs: Reviewed and stable  Complications: No apparent anesthesia complications

## 2014-08-05 NOTE — Anesthesia Procedure Notes (Signed)
Procedure Name: Intubation Date/Time: 08/05/2014 7:39 AM Performed by: Coralee Rud Pre-anesthesia Checklist: Patient identified, Emergency Drugs available, Suction available and Patient being monitored Patient Re-evaluated:Patient Re-evaluated prior to inductionOxygen Delivery Method: Circle system utilized Preoxygenation: Pre-oxygenation with 100% oxygen Intubation Type: IV induction Ventilation: Mask ventilation without difficulty Laryngoscope Size: Miller and 3 Grade View: Grade I Tube type: Oral Tube size: 8.0 mm Number of attempts: 1 Placement Confirmation: ETT inserted through vocal cords under direct vision,  positive ETCO2 and breath sounds checked- equal and bilateral Secured at: 22 cm Tube secured with: Tape Dental Injury: Teeth and Oropharynx as per pre-operative assessment

## 2014-08-05 NOTE — Brief Op Note (Signed)
08/05/2014  12:54 PM  PATIENT:  Michael Hill  63 y.o. male  PRE-OPERATIVE DIAGNOSIS:  Scoliosis, Stenosis, Spondylosis, Lumbago L 23, L 34, L 45, L 5 S1 levels  POST-OPERATIVE DIAGNOSIS:  Scoliosis, Stenosis, Spondylosis, Lumbago L 23, L 34, L 45, L 5 S1 levels  PROCEDURE:  Procedure(s) with comments: Lumbar five-Sacral-One Anterior lumbar interbody fusion with Dr. Edilia Bo for approach; Right Lumbar two-three,Lumbar three-four,Lumbar four-five Anterior lateral lumbar interbody fusion (N/A) Right Lumbar two-three,Lumbar three-four,Lumbar four-five Anterior lateral lumbar interbody fusion (Right) - right  ABDOMINAL EXPOSURE (N/A)  SURGEON:  Surgeon(s) and Role: Panel 1:    * Maeola Harman, MD - Primary    * Barnett Abu, MD - Assisting  Panel 2:    * Chuck Hint, MD - Primary  PHYSICIAN ASSISTANT:   ASSISTANTS: Poteat, RN  ANESTHESIA:   general  EBL:  Total I/O In: 2000 [I.V.:2000] Out: 565 [Urine:415; Blood:150]  BLOOD ADMINISTERED:none  DRAINS: none   LOCAL MEDICATIONS USED:  LIDOCAINE   SPECIMEN:  No Specimen  DISPOSITION OF SPECIMEN:  N/A  COUNTS:  YES  TOURNIQUET:  * No tourniquets in log *  DICTATION: INDICATIONS:  Pateint is 63 year old male with chronic and intractable back and bilateral lower extremity pain with scoliosis, stenosis, disc degeneration and spondylosis at the L 23, L 34, L 45, L 5 S 1 level.  It was elected to take him to surgery for anterior lumbar decompression and fusion at the L 5 S1 level, anterolateral decompression and fusion L 23, L 34, L 45 levels as Stage I and planned L 2 - S 1 percutaneous pedicle screw fixation to be performed as a planned second stage.  PROCEDURE:  Doctor Durwin Nora performed exposure and his portion of the procedure will be dictated separately.  Upon exposing the L 5 S1 level, a localizing X ray was obtained with the C arm.  I then incised the anterior annulus and performed a thorough discectomy.  The  endplates were cleared of disc and cartilagenous material and a thorough discectomy was performed with decompression of the ventral annulus and disc material.  After trial, a 12 degree lordotic x 38 x 34 mm Nuvasive PEEK spacer was selected, packed with Osteocel plus.  The implant was tamped into position and positioning was confirmed with C arm.  22.5 x 6 mm screws were then placed through the implant into the L 5 and S 1 vertebrae.  Locking mechanisms were engaged, soft tissues were inspected and found to be in good repair.  Retractors were removed.  Fascia was closed with 0 vicryl running stitch, skin edges closed with 2-0 and 3-0 vicryl sutures.  Wound was dressed with a sterile occlusive dressing.  Patient was then  placed in a left lateral decubitus position on the operative table and using orthogonally projected C-arm fluoroscopy the patient was placed so that the L2-3 L3-4 and L4-5 levels were visualized in AP and lateral plane. The patient was then taped into position. The table was flexed so as to expose the L4-5 level as the patient has a high iliac crest. Skin was marked along with a posterior finger dissection incision. His flank was then prepped and draped in usual sterile fashion and incisions were made sequentially at L4-5 L3-4 and L2-3 levels. Posterior finger dissection was made to enter the retroperitoneal space and then subsequently the probe was inserted into the psoas muscle from the left side initially at the L4-5 level. After mapping the neural elements  were able to dock the probe per the midpoint of this vertebral level and without indications electrically of too close proximity to the neural tissues. Subsequently the self-retaining tractor was.after sequential dilators were utilized the shim was employed and the interspace was cleared of psoas muscle and then incised. A thorough discectomy was performed. Angled instruments were used to clear the interspace of disc material. After thorough  discectomy was performed and this was performed using AP and lateral fluoroscopy a 10 lordotic by 55 x 22 mm implant was packed with Osteocel plus. This was tamped into position using the slides and its position was confirmed on AP and lateral fluoroscopy. Subsequently exposure was performed at the L3-4 level and similar dissection was performed with locking of the self-retaining retractor. At this level were able to place a 10 lordotic by 22 x 50 mm implant packed in a similar fashion. At the L2-3 level were able to place an 10 lordotic by 50 x 22 mm implant packed in a similar fashion. Hemostasis was assured the wounds were irrigated interrupted Vicryl sutures.  Sterile occlusive dressing was placed with Dermabond. The patient was then extubated in the operating room and taken to recovery in stable and satisfactory condition having tolerated his operation well. Counts were correct at the end of the case.   PLAN OF CARE: Admit to inpatient   PATIENT DISPOSITION:  PACU - hemodynamically stable.   Delay start of Pharmacological VTE agent (>24hrs) due to surgical blood loss or risk of bleeding: yes  

## 2014-08-05 NOTE — H&P (View-Only) (Signed)
Patient ID: Michael Hill, male   DOB: [birth year], 63 y.o.   MRN: [id number]  Reason for Consult: Evaluate for ALIF. Referred by Dr. Maeola Harman   Referred by No ref. provider found  Subjective:     HPI:  Michael Hill is a 63 y.o. male who has a long history of back pain. He had multiple sports injuries when he was younger. Approximately 2 years ago he injured his back lifting something and has had significant back pain since that time. He has tried physical therapy injection therapy and medications without significant relief. I've been asked to provide anterior retroperitoneal exposure of L5-S1. Dr. Venetia Maxon plans on the dressing multiple levels from a different approach also.  Patient's risk factors for vascular disease and hypertension and hypercholesterolemia. In addition he has a family history of vascular disease. He does not smoke tobacco.  History reviewed. No pertinent past medical history. Family History  Problem Relation Age of Onset  . Heart disease Father     before age 2  . Hyperlipidemia Father   . Hypertension Father   . Hypertension Brother   . Heart disease Brother     before age 63  . Cancer Daughter    History reviewed. No pertinent past surgical history.  Short Social History:  History  Substance Use Topics  . Smoking status: Never Smoker   . Smokeless tobacco: Never Used  . Alcohol Use: No   No Known Allergies  Current Outpatient Prescriptions  Medication Sig Dispense Refill  . amLODipine-benazepril (LOTREL) 5-10 MG per capsule Take 1 capsule by mouth daily.      Marland Kitchen HYDROcodone-acetaminophen (NORCO/VICODIN) 5-325 MG per tablet Take 1 tablet by mouth every 8 (eight) hours.       No current facility-administered medications for this visit.   Review of Systems  Constitutional: Negative for chills and fever.  Eyes: Negative for loss of vision.  Respiratory: Negative for cough and wheezing.  Cardiovascular: Negative for chest pain, chest  tightness, claudication, dyspnea with exertion, orthopnea and palpitations.  GI: Negative for blood in stool and vomiting.  GU: Negative for dysuria and hematuria.  Musculoskeletal: Negative for leg pain, joint pain and myalgias.  Skin: Negative for rash and wound.  Neurological: Negative for dizziness and speech difficulty.  Hematologic: Negative for bruises/bleeds easily. Psychiatric: Negative for depressed mood.       Objective:  Objective  Filed Vitals:   07/23/14 0904  BP: 138/86  Pulse: 69  Height:  (1.702 m)  Weight: 215 lb 6.4 oz (97.705 kg)  SpO2: 97%   Body mass index is 33.73 kg/(m^2).  Physical Exam  Constitutional: He is oriented to person, place, and time. He appears well-developed and well-nourished.  HENT:  Head: Normocephalic and atraumatic.  Neck: Neck supple. No JVD present. No thyromegaly present.  Cardiovascular: Normal rate, regular rhythm and normal heart sounds.  Exam reveals no friction rub.   No murmur heard. Pulses:      Femoral pulses are 2+ on the right side, and 2+ on the left side.      Popliteal pulses are 2+ on the right side, and 2+ on the left side.  Pulmonary/Chest: Breath sounds normal. He has no wheezes. He has no rales.  Abdominal: Soft. Bowel sounds are normal. There is no tenderness.  I do not palpate an aneurysm.  Musculoskeletal: Normal range of motion. He exhibits no edema.  Lymphadenopathy:    He has no cervical adenopathy.  Neurological: He is  alert and oriented to person, place, and time. He has normal strength. No sensory deficit.  Skin: No lesion and no rash noted.  Psychiatric: He has a normal mood and affect.   Data: I reviewed his previous MRI. This was in 2013.     Assessment/Plan:    DDD (degenerative disc disease), lumbar The patient appears to be a good candidate for anterior retroperitoneal exposure of L5-S1. I have reviewed our role in exposure of the spine in order to allow anterior lumbar interbody  fusion at the appropriate levels. We have discussed the potential complications of surgery, including but not limited to, arterial or venous injury, thrombosis, or bleeding. We have also discussed the potential risks of wound healing problems, the development of a hernia, nerve injury, leg swelling, or other unpredictable medical problems. I've also explained that for the L5-S1 level there is a small risk of retrograde ejaculation. All the patient's questions were answered and they are agreeable to proceed. The surgery is scheduled for 08/05/2014.   Chuck Hint MD [facility]

## 2014-08-05 NOTE — Anesthesia Postprocedure Evaluation (Signed)
  Anesthesia Post-op Note  Patient: Michael Hill  Procedure(s) Performed: Procedure(s) with comments: Lumbar five-Sacral-One Anterior lumbar interbody fusion with Dr. Edilia Bo for approach; Right Lumbar two-three,Lumbar three-four,Lumbar four-five Anterior lateral lumbar interbody fusion (N/A) Right Lumbar two-three,Lumbar three-four,Lumbar four-five Anterior lateral lumbar interbody fusion (Right) - right  ABDOMINAL EXPOSURE (N/A)  Patient Location: PACU  Anesthesia Type:General  Level of Consciousness: awake and alert   Airway and Oxygen Therapy: Patient Spontanous Breathing  Post-op Pain: mild  Post-op Assessment: Post-op Vital signs reviewed  Post-op Vital Signs: stable  Last Vitals:  Filed Vitals:   08/05/14 1400  BP: 183/87  Pulse: 96  Temp:   Resp: 14    Complications: No apparent anesthesia complications

## 2014-08-06 ENCOUNTER — Encounter (HOSPITAL_COMMUNITY): Payer: Self-pay | Admitting: General Practice

## 2014-08-06 ENCOUNTER — Inpatient Hospital Stay (HOSPITAL_COMMUNITY): Payer: BC Managed Care – PPO

## 2014-08-06 MED ORDER — CEFAZOLIN SODIUM-DEXTROSE 2-3 GM-% IV SOLR
2.0000 g | INTRAVENOUS | Status: AC
  Administered 2014-08-07: 2 g via INTRAVENOUS
  Filled 2014-08-06: qty 50

## 2014-08-06 MED FILL — Sodium Chloride IV Soln 0.9%: INTRAVENOUS | Qty: 1000 | Status: AC

## 2014-08-06 MED FILL — Heparin Sodium (Porcine) Inj 1000 Unit/ML: INTRAMUSCULAR | Qty: 30 | Status: AC

## 2014-08-06 NOTE — Evaluation (Signed)
Occupational Therapy Evaluation Patient Details Name: Michael Hill MRN: [id number] DOB: 31-Jan-1951 Today's Date: 08/06/2014    History of Present Illness 63 y.o. male s/p Dr Rubye Oaks anterior PLIF L5-S1. Dr Venetia Maxon PLIF L2-3 L3-4 L4-5. Planned second stage 08/07/14. LSO brace provided to patient pta. PMH: HTN , obese, HLD, chronic back pain   Clinical Impression   Patient is s/p PLIF L5-2 and PLIF L2-5 by Dr Rush Farmer surgery resulting in functional limitations due to the deficits listed below (see OT problem list). Pt with second part of surgery scheduled for 08/07/14. PTA pt was independent with no DME. Patient will benefit from skilled OT acutely to increase independence and safety with ADLS to allow discharge home without OT needs. Ot to follow acutely for LB adls and bed mobility after second surgery.     Follow Up Recommendations  No OT follow up    Equipment Recommendations  Other (comment) (TBA after second surg 9/3)    Recommendations for Other Services       Precautions / Restrictions Precautions Precautions: Back Precaution Comments: provided back precautions handout Required Braces or Orthoses: Spinal Brace Spinal Brace: Lumbar corset;Applied in sitting position      Mobility Bed Mobility Overal bed mobility: Needs Assistance Bed Mobility: Rolling;Sidelying to Sit;Supine to Sit Rolling: Min guard Sidelying to sit: Min assist Supine to sit: Min assist     General bed mobility comments: HOb 6 degrees, Educated on back precautions with bed mobiility  Transfers Overall transfer level: Needs assistance Equipment used: Rolling walker (2 wheeled) Transfers: Sit to/from Stand Sit to Stand: Min guard         General transfer comment: educated on proper use of RW with hand placement, educated on backing up to chair and sliding forward in chair to complete transfer    Balance Overall balance assessment: Needs assistance Sitting-balance support: Feet supported;No  upper extremity supported Sitting balance-Leahy Scale: Good                                      ADL Overall ADL's : Needs assistance/impaired Eating/Feeding: Set up;Sitting   Grooming: Oral care;Min guard;Standing Grooming Details (indicate cue type and reason): educated on back precautions with adls         Upper Body Dressing : Min guard;Sitting Upper Body Dressing Details (indicate cue type and reason): don doff brace   Lower Body Dressing Details (indicate cue type and reason): to be addressed after second surg Toilet Transfer: Min guard;Ambulation;Regular Toilet;Grab bars;RW Statistician Details (indicate cue type and reason): requires use of grab bar          Functional mobility during ADLs: Min guard;Rolling walker General ADL Comments: Pt educated on back precautions and handout provided. Pt s wife with extensive questinos and wanting information repeated twice during session. wife very anxious with patient mobilizing and following patient with all progression in session. Pt reports fatigue after PT and sink level OT task. pt positioned in chair to eat jello. OT called back to room to return to bed after 10 minutes in chair.      Vision                     Perception     Praxis      Pertinent Vitals/Pain Pain Assessment: 0-10 Pain Score: 3  Pain Location: back Pain Descriptors / Indicators: Constant Pain Intervention(s): Repositioned;PCA encouraged  Hand Dominance Right   Extremity/Trunk Assessment Upper Extremity Assessment Upper Extremity Assessment: Overall WFL for tasks assessed   Lower Extremity Assessment Lower Extremity Assessment: Defer to PT evaluation   Cervical / Trunk Assessment Cervical / Trunk Assessment: Normal   Communication Communication Communication: No difficulties   Cognition Arousal/Alertness: Awake/alert Behavior During Therapy: WFL for tasks assessed/performed Overall Cognitive Status: Within  Functional Limits for tasks assessed                     General Comments       Exercises       Shoulder Instructions      Home Living Family/patient expects to be discharged to:: Private residence Living Arrangements: Spouse/significant other Available Help at Discharge: Family;Available 24 hours/day Type of Home: House Home Access: Stairs to enter [employer] of Steps: 5 Entrance Stairs-Rails: Can reach both Home Layout: One level     Bathroom Shower/Tub: Tub/shower unit (do have a walk in shower)   Bathroom Toilet: Standard     Home Equipment: Environmental consultant - 2 wheels;Grab bars - tub/shower   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      Prior Functioning/Environment Level of Independence: Independent             OT Diagnosis: Generalized weakness;Acute pain   OT Problem List: Decreased strength;Impaired balance (sitting and/or standing);Decreased activity tolerance;Decreased safety awareness;Decreased knowledge of use of DME or AE;Decreased knowledge of precautions;Pain   OT Treatment/Interventions: Self-care/ADL training;Therapeutic exercise;DME and/or AE instruction;Therapeutic activities;Patient/family education;Balance training    OT Goals(Current goals can be found in the care plan section) Acute Rehab OT Goals Patient Stated Goal: to be independent OT Goal Formulation: With patient/family Time For Goal Achievement: 08/20/14 Potential to Achieve Goals: Good  OT Frequency: Min 2X/week   Barriers to D/C:            Co-evaluation              End of Session Equipment Utilized During Treatment: Gait belt;Rolling walker;Back brace Nurse Communication: Mobility status  Activity Tolerance: Patient tolerated treatment well Patient left: with call bell/phone within reach;in bed;with family/visitor present   Time: 1140-1203 (10-19-1129 first visit) OT Time Calculation (min): 23 min Charges:  OT General Charges $OT  Visit: 1 Procedure OT Evaluation $Initial OT Evaluation Tier I: 1 Procedure OT Treatments $Self Care/Home Management : 23-37 mins G-Codes:    Harolyn Rutherford 08/30/14, 2:35 PM Pager: [phone]

## 2014-08-06 NOTE — Progress Notes (Signed)
UR complete.  Saket Hellstrom RN, MSN 

## 2014-08-06 NOTE — Evaluation (Signed)
Physical Therapy Evaluation Patient Details Name: Michael Hill MRN: [id number] DOB: 1950/12/06 Today's Date: 08/06/2014   History of Present Illness  63 y.o. male s/p Dr Rubye Oaks anterior PLIF L5-S1. Dr Venetia Maxon PLIF L2-3 L3-4 L4-5. Planned second stage 08/07/14. LSO brace provided to patient pta. PMH: HTN , obese, HLD, chronic back pain  Clinical Impression  Pt admitted with/for lumbar fusion surgery.  Pt currently limited functionally due to the problems listed below.  (see problems list.)  Pt will benefit from PT to maximize function and safety to be able to get home safely with available assist of family.     Follow Up Recommendations No PT follow up;Supervision for mobility/OOB    Equipment Recommendations  3in1 (PT)    Recommendations for Other Services       Precautions / Restrictions Precautions Precautions: Back Precaution Comments: provided back precautions handout Required Braces or Orthoses: Spinal Brace Spinal Brace: Lumbar corset;Applied in sitting position      Mobility  Bed Mobility Overal bed mobility: Needs Assistance Bed Mobility: Rolling;Sidelying to Sit;Supine to Sit Rolling: Min guard Sidelying to sit: Min assist Supine to sit: Min assist     General bed mobility comments: HOb 6 degrees, Educated on back precautions with bed mobiility  Transfers Overall transfer level: Needs assistance Equipment used: Rolling walker (2 wheeled) Transfers: Sit to/from Stand Sit to Stand: Min guard         General transfer comment: educated on proper use of RW with hand placement  Ambulation/Gait Ambulation/Gait assistance: Min guard Ambulation Distance (Feet): 200 Feet Assistive device: Rolling walker (2 wheeled) Gait Pattern/deviations: Step-through pattern Gait velocity: slower   General Gait Details: steady, but slow and guarded  Stairs            Wheelchair Mobility    Modified Rankin (Stroke Patients Only)       Balance Overall balance  assessment: No apparent balance deficits (not formally assessed) Sitting-balance support: Feet supported;No upper extremity supported Sitting balance-Leahy Scale: Good                                       Pertinent Vitals/Pain Pain Assessment: 0-10 Pain Score: 3  Pain Location: back Pain Descriptors / Indicators: Constant Pain Intervention(s): Repositioned;PCA encouraged    Home Living Family/patient expects to be discharged to:: Private residence Living Arrangements: Spouse/significant other Available Help at Discharge: Family;Available 24 hours/day Type of Home: House Home Access: Stairs to enter Entrance Stairs-Rails: Can reach both Entrance Stairs-Number of Steps: 5 Home Layout: One level Home Equipment: Environmental consultant - 2 wheels;Grab bars - tub/shower 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): educated pt and wife on back  care/prec., logroll, bracing issues, lifting restrictions, and progression of activity.  Fielded pt/wife's questions.    Exercises        Assessment/Plan    PT Assessment Patient needs continued PT services  PT Diagnosis Difficulty walking;Acute pain   PT Problem List Decreased activity tolerance;Decreased mobility;Decreased knowledge of use of DME;Decreased knowledge of precautions;Pain  PT Treatment  Interventions Gait training;DME instruction;Stair training;Functional mobility training;Therapeutic activities;Patient/family education   PT Goals (Current goals can be found in the Care Plan section) Acute Rehab PT Goals Patient Stated Goal: independent PT Goal Formulation: With patient Time For Goal Achievement: 08/13/14 Potential to Achieve Goals: Good    Frequency Min 5X/week   Barriers to discharge        Co-evaluation               End of Session Equipment Utilized During Treatment: Back brace Activity Tolerance: Patient tolerated treatment well Patient left: in chair;with call bell/phone within reach;with family/visitor present Nurse Communication: Mobility status         Time: 1110-1145 PT Time Calculation (min): 35 min   Charges:   PT Evaluation $Initial PT Evaluation Tier I: 1 Procedure PT Treatments $Gait Training: 8-22 mins   PT G Codes:          Tailor Westfall, Eliseo Gum 08/06/2014, 12:56 PM 08/06/2014  [place] Bing, PT [phone]  (pager)

## 2014-08-06 NOTE — Progress Notes (Signed)
OT Cancellation Note  Patient Details Name: Michael Hill MRN: [id number] DOB: [birth year]   Cancelled Treatment:    Reason Eval/Treat Not Completed: Patient at procedure or test/ unavailable (exiting room for xray) Ot spoke with patient and let patient know therapy will return later today  Harolyn Rutherford Pager: [phone]  08/06/2014, 9:13 AM

## 2014-08-06 NOTE — Progress Notes (Signed)
   VASCULAR SURGERY ASSESSMENT & PLAN:  * 1 Day Post-Op s/p: Anterior RP exposure of L5-S1.  *  Doing well. Vascular will be available as needed.   SUBJECTIVE: Pain under good control.   PHYSICAL EXAM: Filed Vitals:   08/06/14 0131 08/06/14 0333 08/06/14 0334 08/06/14 0519  BP: 110/56   119/72  Pulse: 92   87  Temp: 97.9 F (36.6 C)   98 F (36.7 C)  TempSrc: Oral   Oral  Resp: Weight:      SpO2: 99% 97% 96% 97%   Palpable left PT pulse. No L LE swelling.   LABS: Lab Results  Component Value Date   WBC 4.8 07/28/2014   HGB 14.7 07/28/2014   HCT 42.5 07/28/2014   MCV 87.4 07/28/2014   PLT 200 07/28/2014   Lab Results  Component Value Date   CREATININE 0.99 07/28/2014   Active Problems:   Lumbar spine scoliosis  Cari Caraway Beeper: [phone] 08/06/2014

## 2014-08-06 NOTE — Progress Notes (Signed)
Subjective: Patient reports incisional pain overnight, now doing better  Objective: Vital signs in last 24 hours: Temp:  [97.5 F (36.4 C)-98 F (36.7 C)] 98 F (36.7 C) (09/02 0519) Pulse Rate:  [86-106] 87 (09/02 0519) Resp:  [7-20] 14 (09/02 0519) BP: (102-187)/(56-89) 119/72 mmHg (09/02 0519) SpO2:  [92 %-100 %] 97 % (09/02 0519) FiO2 (%):  [96 %-98 %] 96 % (09/02 0334)  Intake/Output from previous day: 09/01 0701 - 09/02 0700 In: 3000 [I.V.:3000] Out: 965 [Urine:815; Blood:150] Intake/Output this shift:    Physical Exam: Strength full, belly soft, dressings CDI.  Lab Results: No results found for this basename: WBC, HGB, HCT, PLT,  in the last 72 hours BMET No results found for this basename: NA, K, CL, CO2, GLUCOSE, BUN, CREATININE, CALCIUM,  in the last 72 hours  Studies/Results: Dg Lumbar Spine 2-3 Views  08/05/2014   CLINICAL DATA:  Multi-level anterior lumbar interbody fusion.  EXAM: LUMBAR SPINE - 2-3 VIEW; DG C-ARM 61-120 MIN  COMPARISON:  MRI lumbar spine 07/08/2014  FINDINGS: Multiple fluoroscopic images of the lower lumbar spine demonstrate intervertebral spacers. The visualized vertebral bodies are grossly unremarkable.  IMPRESSION: Postoperative change anterior lumbar interbody fusion.   Electronically Signed   By: Annia Belt M.D.   On: 08/05/2014 12:50   Dg C-arm 61-120 Min  08/05/2014   CLINICAL DATA:  Multi-level anterior lumbar interbody fusion.  EXAM: LUMBAR SPINE - 2-3 VIEW; DG C-ARM 61-120 MIN  COMPARISON:  MRI lumbar spine 07/08/2014  FINDINGS: Multiple fluoroscopic images of the lower lumbar spine demonstrate intervertebral spacers. The visualized vertebral bodies are grossly unremarkable.  IMPRESSION: Postoperative change anterior lumbar interbody fusion.   Electronically Signed   By: Annia Belt M.D.   On: 08/05/2014 12:50   Dg Or Local Abdomen  08/05/2014   CLINICAL DATA:  L5-S1 a left.  For final instrument count.  EXAM: OR LOCAL ABDOMEN  COMPARISON:   None.  FINDINGS: Frontal radiograph of the lower lumbar spine and pelvis shows a cage and associated screws for anterior lumbar interbody fusion. No unexpected foreign body. Positioning of the graft is unremarkable in the frontal projection.  There is mild lumbar levoscoliosis with accelerated degenerative disc disease, as delineated on lumbar spine MRI 05/08/2014.  These results were called by telephone at the time of interpretation on 08/05/2014 at 10:05 am to OR staff, who verbally acknowledged these results.  IMPRESSION: Hardware related to L5-S1 ALIF.  No unexpected foreign body.   Electronically Signed   By: Tiburcio Pea M.D.   On: 08/05/2014 10:06    Assessment/Plan: Mobilize in brace today.  Upright weight-bearing X rays.  Second stage tomorrow.    LOS: 1 day    Raj Landress D, MD 08/06/2014, 8:00 AM

## 2014-08-07 ENCOUNTER — Encounter (HOSPITAL_COMMUNITY): Payer: Self-pay | Admitting: Certified Registered Nurse Anesthetist

## 2014-08-07 ENCOUNTER — Encounter (HOSPITAL_COMMUNITY)
Admission: RE | Disposition: A | Discharge status: Home Health | Payer: Self-pay | Source: Ambulatory Visit | Attending: Neurosurgery

## 2014-08-07 ENCOUNTER — Inpatient Hospital Stay (HOSPITAL_COMMUNITY): Admission: RE | Admit: 2014-08-07 | Payer: BC Managed Care – PPO | Source: Ambulatory Visit | Admitting: Neurosurgery

## 2014-08-07 ENCOUNTER — Inpatient Hospital Stay (HOSPITAL_COMMUNITY): Payer: BC Managed Care – PPO

## 2014-08-07 ENCOUNTER — Inpatient Hospital Stay (HOSPITAL_COMMUNITY): Payer: BC Managed Care – PPO | Admitting: Certified Registered Nurse Anesthetist

## 2014-08-07 ENCOUNTER — Encounter (HOSPITAL_COMMUNITY): Payer: BC Managed Care – PPO | Admitting: Certified Registered Nurse Anesthetist

## 2014-08-07 HISTORY — PX: LUMBAR PERCUTANEOUS PEDICLE SCREW 3 LEVEL: SHX5562

## 2014-08-07 SURGERY — LUMBAR PERCUTANEOUS PEDICLE SCREW 3 LEVEL
Anesthesia: General | Site: Back

## 2014-08-07 MED ORDER — NEOSTIGMINE METHYLSULFATE 10 MG/10ML IV SOLN
INTRAVENOUS | Status: DC | PRN
  Administered 2014-08-07: 4 mg via INTRAVENOUS

## 2014-08-07 MED ORDER — PHENOL 1.4 % MT LIQD: 1.0000 | OROMUCOSAL | Status: DC | PRN

## 2014-08-07 MED ORDER — LIDOCAINE HCL (CARDIAC) 20 MG/ML IV SOLN
INTRAVENOUS | Status: AC
  Filled 2014-08-07: qty 5

## 2014-08-07 MED ORDER — SUCCINYLCHOLINE CHLORIDE 20 MG/ML IJ SOLN
INTRAMUSCULAR | Status: AC
  Filled 2014-08-07: qty 1

## 2014-08-07 MED ORDER — ACETAMINOPHEN 325 MG PO TABS: 650.0000 mg | ORAL_TABLET | ORAL | Status: DC | PRN

## 2014-08-07 MED ORDER — GLYCOPYRROLATE 0.2 MG/ML IJ SOLN
INTRAMUSCULAR | Status: AC
  Filled 2014-08-07: qty 3

## 2014-08-07 MED ORDER — NALOXONE HCL 0.4 MG/ML IJ SOLN: 0.4000 mg | INTRAMUSCULAR | Status: DC | PRN

## 2014-08-07 MED ORDER — OXYCODONE-ACETAMINOPHEN 5-325 MG PO TABS: 1.0000 | ORAL_TABLET | ORAL | Status: DC | PRN

## 2014-08-07 MED ORDER — FENTANYL CITRATE 0.05 MG/ML IJ SOLN
INTRAMUSCULAR | Status: AC
  Filled 2014-08-07: qty 5

## 2014-08-07 MED ORDER — CEFAZOLIN SODIUM 1-5 GM-% IV SOLN
1.0000 g | Freq: Three times a day (TID) | INTRAVENOUS | Status: AC
  Administered 2014-08-07 (×2): 1 g via INTRAVENOUS
  Filled 2014-08-07 (×2): qty 50

## 2014-08-07 MED ORDER — ROCURONIUM BROMIDE 100 MG/10ML IV SOLN
INTRAVENOUS | Status: DC | PRN
  Administered 2014-08-07: 50 mg via INTRAVENOUS

## 2014-08-07 MED ORDER — MORPHINE SULFATE (PF) 1 MG/ML IV SOLN: INTRAVENOUS | Status: DC

## 2014-08-07 MED ORDER — ROCURONIUM BROMIDE 50 MG/5ML IV SOLN
INTRAVENOUS | Status: AC
  Filled 2014-08-07: qty 1

## 2014-08-07 MED ORDER — LIDOCAINE HCL (CARDIAC) 20 MG/ML IV SOLN
INTRAVENOUS | Status: DC | PRN
  Administered 2014-08-07: 80 mg via INTRAVENOUS

## 2014-08-07 MED ORDER — SODIUM CHLORIDE 0.9 % IV SOLN
250.0000 mL | INTRAVENOUS | Status: DC
  Administered 2014-08-07: 250 mL via INTRAVENOUS

## 2014-08-07 MED ORDER — DOCUSATE SODIUM 100 MG PO CAPS: 100.0000 mg | ORAL_CAPSULE | Freq: Two times a day (BID) | ORAL | Status: DC

## 2014-08-07 MED ORDER — HYDROMORPHONE HCL PF 1 MG/ML IJ SOLN
INTRAMUSCULAR | Status: AC
  Filled 2014-08-07: qty 1

## 2014-08-07 MED ORDER — MORPHINE SULFATE 2 MG/ML IJ SOLN
INTRAMUSCULAR | Status: AC
  Filled 2014-08-07: qty 1

## 2014-08-07 MED ORDER — EPHEDRINE SULFATE 50 MG/ML IJ SOLN
INTRAMUSCULAR | Status: AC
  Filled 2014-08-07: qty 1

## 2014-08-07 MED ORDER — FLEET ENEMA 7-19 GM/118ML RE ENEM: 1.0000 | ENEMA | Freq: Once | RECTAL | Status: AC | PRN

## 2014-08-07 MED ORDER — EPHEDRINE SULFATE 50 MG/ML IJ SOLN
INTRAMUSCULAR | Status: DC | PRN
  Administered 2014-08-07 (×2): 10 mg via INTRAVENOUS
  Administered 2014-08-07: 5 mg via INTRAVENOUS

## 2014-08-07 MED ORDER — ONDANSETRON HCL 4 MG/2ML IJ SOLN
INTRAMUSCULAR | Status: DC | PRN
  Administered 2014-08-07: 4 mg via INTRAVENOUS

## 2014-08-07 MED ORDER — BISACODYL 10 MG RE SUPP: 10.0000 mg | Freq: Every day | RECTAL | Status: DC | PRN

## 2014-08-07 MED ORDER — ONDANSETRON HCL 4 MG/2ML IJ SOLN: 4.0000 mg | Freq: Four times a day (QID) | INTRAMUSCULAR | Status: DC | PRN

## 2014-08-07 MED ORDER — CEFAZOLIN SODIUM-DEXTROSE 2-3 GM-% IV SOLR
INTRAVENOUS | Status: DC | PRN
  Administered 2014-08-07: 2 g via INTRAVENOUS

## 2014-08-07 MED ORDER — DIPHENHYDRAMINE HCL 50 MG/ML IJ SOLN: 12.5000 mg | Freq: Four times a day (QID) | INTRAMUSCULAR | Status: DC | PRN

## 2014-08-07 MED ORDER — DIAZEPAM 5 MG PO TABS
ORAL_TABLET | ORAL | Status: AC
  Filled 2014-08-07: qty 1

## 2014-08-07 MED ORDER — FENTANYL CITRATE 0.05 MG/ML IJ SOLN
INTRAMUSCULAR | Status: DC | PRN
  Administered 2014-08-07: 150 ug via INTRAVENOUS

## 2014-08-07 MED ORDER — SENNA 8.6 MG PO TABS: 1.0000 | ORAL_TABLET | Freq: Two times a day (BID) | ORAL | Status: DC

## 2014-08-07 MED ORDER — MIDAZOLAM HCL 2 MG/2ML IJ SOLN
INTRAMUSCULAR | Status: AC
  Filled 2014-08-07: qty 2

## 2014-08-07 MED ORDER — ARTIFICIAL TEARS OP OINT
TOPICAL_OINTMENT | OPHTHALMIC | Status: AC
  Filled 2014-08-07: qty 3.5

## 2014-08-07 MED ORDER — DIPHENHYDRAMINE HCL 12.5 MG/5ML PO ELIX: 12.5000 mg | ORAL_SOLUTION | Freq: Four times a day (QID) | ORAL | Status: DC | PRN

## 2014-08-07 MED ORDER — MENTHOL 3 MG MT LOZG
1.0000 | LOZENGE | OROMUCOSAL | Status: DC | PRN
Start: 2014-08-07 — End: 2014-08-07

## 2014-08-07 MED ORDER — 0.9 % SODIUM CHLORIDE (POUR BTL) OPTIME
TOPICAL | Status: DC | PRN
  Administered 2014-08-07: 1000 mL

## 2014-08-07 MED ORDER — MIDAZOLAM HCL 5 MG/5ML IJ SOLN
INTRAMUSCULAR | Status: DC | PRN
  Administered 2014-08-07: 2 mg via INTRAVENOUS

## 2014-08-07 MED ORDER — KCL IN DEXTROSE-NACL 20-5-0.45 MEQ/L-%-% IV SOLN
INTRAVENOUS | Status: DC
  Administered 2014-08-07: 15:00:00 via INTRAVENOUS
  Filled 2014-08-07: qty 1000

## 2014-08-07 MED ORDER — BUPIVACAINE LIPOSOME 1.3 % IJ SUSP
INTRAMUSCULAR | Status: DC | PRN
  Administered 2014-08-07: 20 mL

## 2014-08-07 MED ORDER — GLYCOPYRROLATE 0.2 MG/ML IJ SOLN
INTRAMUSCULAR | Status: DC | PRN
  Administered 2014-08-07: 0.6 mg via INTRAVENOUS

## 2014-08-07 MED ORDER — POLYETHYLENE GLYCOL 3350 17 G PO PACK: 17.0000 g | PACK | Freq: Every day | ORAL | Status: DC | PRN

## 2014-08-07 MED ORDER — DEXAMETHASONE SODIUM PHOSPHATE 10 MG/ML IJ SOLN
INTRAMUSCULAR | Status: AC
  Filled 2014-08-07: qty 1

## 2014-08-07 MED ORDER — ONDANSETRON HCL 4 MG/2ML IJ SOLN
INTRAMUSCULAR | Status: AC
  Filled 2014-08-07: qty 2

## 2014-08-07 MED ORDER — SODIUM CHLORIDE 0.9 % IJ SOLN
INTRAMUSCULAR | Status: AC
  Filled 2014-08-07: qty 10

## 2014-08-07 MED ORDER — ACETAMINOPHEN 650 MG RE SUPP: 650.0000 mg | RECTAL | Status: DC | PRN

## 2014-08-07 MED ORDER — ARTIFICIAL TEARS OP OINT
TOPICAL_OINTMENT | OPHTHALMIC | Status: DC | PRN
  Administered 2014-08-07: 1 via OPHTHALMIC

## 2014-08-07 MED ORDER — ZOLPIDEM TARTRATE 5 MG PO TABS: 5.0000 mg | ORAL_TABLET | Freq: Every evening | ORAL | Status: DC | PRN

## 2014-08-07 MED ORDER — PROPOFOL 10 MG/ML IV BOLUS
INTRAVENOUS | Status: AC
  Filled 2014-08-07: qty 20

## 2014-08-07 MED ORDER — SODIUM CHLORIDE 0.9 % IJ SOLN
3.0000 mL | Freq: Two times a day (BID) | INTRAMUSCULAR | Status: DC
  Administered 2014-08-08 – 2014-08-10 (×5): 3 mL via INTRAVENOUS

## 2014-08-07 MED ORDER — DEXAMETHASONE SODIUM PHOSPHATE 10 MG/ML IJ SOLN
INTRAMUSCULAR | Status: DC | PRN
  Administered 2014-08-07: 10 mg via INTRAVENOUS

## 2014-08-07 MED ORDER — PROPOFOL 10 MG/ML IV BOLUS
INTRAVENOUS | Status: DC | PRN
  Administered 2014-08-07: 180 mg via INTRAVENOUS

## 2014-08-07 MED ORDER — BUPIVACAINE LIPOSOME 1.3 % IJ SUSP
20.0000 mL | INTRAMUSCULAR | Status: AC
  Filled 2014-08-07: qty 20

## 2014-08-07 MED ORDER — SODIUM CHLORIDE 0.9 % IJ SOLN: 9.0000 mL | INTRAMUSCULAR | Status: DC | PRN

## 2014-08-07 MED ORDER — NEOSTIGMINE METHYLSULFATE 10 MG/10ML IV SOLN
INTRAVENOUS | Status: AC
  Filled 2014-08-07: qty 1

## 2014-08-07 MED ORDER — SODIUM CHLORIDE 0.9 % IJ SOLN
3.0000 mL | INTRAMUSCULAR | Status: DC | PRN
  Administered 2014-08-08: 3 mL via INTRAVENOUS

## 2014-08-07 MED ORDER — PHENYLEPHRINE HCL 10 MG/ML IJ SOLN
INTRAMUSCULAR | Status: DC | PRN
  Administered 2014-08-07 (×3): 80 ug via INTRAVENOUS
  Administered 2014-08-07: 40 ug via INTRAVENOUS
  Administered 2014-08-07: 120 ug via INTRAVENOUS

## 2014-08-07 MED ORDER — MORPHINE SULFATE 2 MG/ML IJ SOLN
1.0000 mg | INTRAMUSCULAR | Status: DC | PRN
  Administered 2014-08-07: 4 mg via INTRAVENOUS
  Administered 2014-08-07 – 2014-08-08 (×3): 2 mg via INTRAVENOUS
  Filled 2014-08-07: qty 2
  Filled 2014-08-07 (×2): qty 1

## 2014-08-07 MED ORDER — ALBUMIN HUMAN 5 % IV SOLN
INTRAVENOUS | Status: DC | PRN
  Administered 2014-08-07: 08:00:00 via INTRAVENOUS

## 2014-08-07 MED ORDER — BUPIVACAINE HCL (PF) 0.5 % IJ SOLN
INTRAMUSCULAR | Status: DC | PRN
  Administered 2014-08-07: 11 mL

## 2014-08-07 MED ORDER — LACTATED RINGERS IV SOLN
INTRAVENOUS | Status: DC | PRN
  Administered 2014-08-07 (×3): via INTRAVENOUS

## 2014-08-07 MED ORDER — ONDANSETRON HCL 4 MG/2ML IJ SOLN: 4.0000 mg | INTRAMUSCULAR | Status: DC | PRN

## 2014-08-07 MED ORDER — LIDOCAINE-EPINEPHRINE 1 %-1:100000 IJ SOLN
INTRAMUSCULAR | Status: DC | PRN
  Administered 2014-08-07: 11 mL

## 2014-08-07 MED ORDER — OXYCODONE-ACETAMINOPHEN 5-325 MG PO TABS
ORAL_TABLET | ORAL | Status: AC
  Filled 2014-08-07: qty 2

## 2014-08-07 MED ORDER — HYDROCODONE-ACETAMINOPHEN 5-325 MG PO TABS: 1.0000 | ORAL_TABLET | ORAL | Status: DC | PRN

## 2014-08-07 MED ORDER — ALUM & MAG HYDROXIDE-SIMETH 200-200-20 MG/5ML PO SUSP: 30.0000 mL | Freq: Four times a day (QID) | ORAL | Status: DC | PRN

## 2014-08-07 MED ORDER — DIAZEPAM 5 MG PO TABS
5.0000 mg | ORAL_TABLET | Freq: Four times a day (QID) | ORAL | Status: DC | PRN
  Administered 2014-08-07 – 2014-08-10 (×9): 5 mg via ORAL
  Filled 2014-08-07 (×9): qty 1

## 2014-08-07 MED ORDER — CEFAZOLIN SODIUM-DEXTROSE 2-3 GM-% IV SOLR
INTRAVENOUS | Status: AC
  Filled 2014-08-07: qty 50

## 2014-08-07 MED ORDER — HYDROMORPHONE HCL PF 1 MG/ML IJ SOLN
0.2500 mg | INTRAMUSCULAR | Status: DC | PRN
  Administered 2014-08-07 (×4): 0.5 mg via INTRAVENOUS

## 2014-08-07 SURGICAL SUPPLY — 71 items
APL SKNCLS STERI-STRIP NONHPOA (GAUZE/BANDAGES/DRESSINGS) ×1
BAG DECANTER FOR FLEXI CONT (MISCELLANEOUS) ×2 IMPLANT
BENZOIN TINCTURE PRP APPL 2/3 (GAUZE/BANDAGES/DRESSINGS) ×2 IMPLANT
CONT SPEC 4OZ CLIKSEAL STRL BL (MISCELLANEOUS) ×4 IMPLANT
COVER BACK TABLE 24X17X13 BIG (DRAPES) IMPLANT
COVER TABLE BACK 60X90 (DRAPES) ×2 IMPLANT
DERMABOND ADHESIVE PROPEN (GAUZE/BANDAGES/DRESSINGS) ×1
DERMABOND ADVANCED .7 DNX6 (GAUZE/BANDAGES/DRESSINGS) ×1 IMPLANT
DIGITIZER BENDINI (MISCELLANEOUS) ×2 IMPLANT
DRAPE C-ARM 42X72 X-RAY (DRAPES) ×2 IMPLANT
DRAPE C-ARMOR (DRAPES) ×2 IMPLANT
DRAPE LAPAROTOMY 100X72X124 (DRAPES) ×2 IMPLANT
DRAPE POUCH INSTRU U-SHP 10X18 (DRAPES) ×2 IMPLANT
DRAPE SURG 17X23 STRL (DRAPES) ×2 IMPLANT
DRSG OPSITE POSTOP 4X10 (GAUZE/BANDAGES/DRESSINGS) ×4 IMPLANT
DRSG TELFA 3X8 NADH (GAUZE/BANDAGES/DRESSINGS) ×2 IMPLANT
DURAPREP 26ML APPLICATOR (WOUND CARE) ×2 IMPLANT
ELECT REM PT RETURN 9FT ADLT (ELECTROSURGICAL) ×2
ELECTRODE REM PT RTRN 9FT ADLT (ELECTROSURGICAL) ×1 IMPLANT
GAUZE SPONGE 4X4 12PLY STRL (GAUZE/BANDAGES/DRESSINGS) ×2 IMPLANT
GAUZE SPONGE 4X4 16PLY XRAY LF (GAUZE/BANDAGES/DRESSINGS) IMPLANT
GLOVE BIO SURGEON STRL SZ8 (GLOVE) ×4 IMPLANT
GLOVE BIOGEL PI IND STRL 7.0 (GLOVE) ×1 IMPLANT
GLOVE BIOGEL PI IND STRL 8 (GLOVE) ×1 IMPLANT
GLOVE BIOGEL PI IND STRL 8.5 (GLOVE) ×2 IMPLANT
GLOVE BIOGEL PI INDICATOR 7.0 (GLOVE) ×1
GLOVE BIOGEL PI INDICATOR 8 (GLOVE) ×1
GLOVE BIOGEL PI INDICATOR 8.5 (GLOVE) ×2
GLOVE ECLIPSE 8.0 STRL XLNG CF (GLOVE) ×2 IMPLANT
GLOVE EXAM NITRILE LRG STRL (GLOVE) IMPLANT
GLOVE EXAM NITRILE MD LF STRL (GLOVE) IMPLANT
GLOVE EXAM NITRILE XL STR (GLOVE) IMPLANT
GLOVE EXAM NITRILE XS STR PU (GLOVE) IMPLANT
GLOVE OPTIFIT SS 6.5 STRL BRWN (GLOVE) ×6 IMPLANT
GOWN STRL REUS W/ TWL LRG LVL3 (GOWN DISPOSABLE) ×1 IMPLANT
GOWN STRL REUS W/ TWL XL LVL3 (GOWN DISPOSABLE) ×1 IMPLANT
GOWN STRL REUS W/TWL 2XL LVL3 (GOWN DISPOSABLE) ×2 IMPLANT
GOWN STRL REUS W/TWL LRG LVL3 (GOWN DISPOSABLE) ×2
GOWN STRL REUS W/TWL XL LVL3 (GOWN DISPOSABLE) ×2
GUIDEWIRE NITINOL BEVEL TIP (WIRE) ×20 IMPLANT
KIT BASIN OR (CUSTOM PROCEDURE TRAY) ×2 IMPLANT
KIT POSITION SURG JACKSON T1 (MISCELLANEOUS) ×2 IMPLANT
KIT ROOM TURNOVER OR (KITS) ×2 IMPLANT
MARKER SKIN DUAL TIP RULER LAB (MISCELLANEOUS) ×2 IMPLANT
NEEDLE HYPO 25X1 1.5 SAFETY (NEEDLE) ×2 IMPLANT
NEEDLE I PASS (NEEDLE) ×4 IMPLANT
NS IRRIG 1000ML POUR BTL (IV SOLUTION) ×2 IMPLANT
PACK LAMINECTOMY NEURO (CUSTOM PROCEDURE TRAY) ×2 IMPLANT
PAD ARMBOARD 7.5X6 YLW CONV (MISCELLANEOUS) ×6 IMPLANT
PATTIES SURGICAL .5 X.5 (GAUZE/BANDAGES/DRESSINGS) IMPLANT
PATTIES SURGICAL .5 X1 (DISPOSABLE) IMPLANT
PATTIES SURGICAL 1X1 (DISPOSABLE) IMPLANT
ROD RELINE LORDOTIC 5.5X140 (Rod) ×2 IMPLANT
ROD RELINE MAS ST 5.5X300MM (Rod) ×2 IMPLANT
SCREW LOCK RELINE 5.5 TULIP (Screw) ×20 IMPLANT
SCREW MAS RELINE 6.5X45 POLY (Screw) ×14 IMPLANT
SCREW MAS RELINE 6.5X50 POLY (Screw) ×4 IMPLANT
SCREW MAS RELINE POLY 6.5X40 (Screw) ×2 IMPLANT
SPONGE LAP 4X18 X RAY DECT (DISPOSABLE) IMPLANT
STAPLER SKIN PROX WIDE 3.9 (STAPLE) IMPLANT
STRIP CLOSURE SKIN 1/2X4 (GAUZE/BANDAGES/DRESSINGS) ×2 IMPLANT
SUT VIC AB 1 CT1 18XBRD ANBCTR (SUTURE) ×2 IMPLANT
SUT VIC AB 1 CT1 8-18 (SUTURE) ×4
SUT VIC AB 2-0 CT1 18 (SUTURE) ×4 IMPLANT
SUT VIC AB 3-0 SH 8-18 (SUTURE) ×6 IMPLANT
SYR 20ML ECCENTRIC (SYRINGE) ×2 IMPLANT
SYR INSULIN 1ML 31GX6 SAFETY (SYRINGE) IMPLANT
TOWEL OR 17X24 6PK STRL BLUE (TOWEL DISPOSABLE) ×2 IMPLANT
TOWEL OR 17X26 10 PK STRL BLUE (TOWEL DISPOSABLE) ×2 IMPLANT
TRAY FOLEY CATH 14FRSI W/METER (CATHETERS) ×2 IMPLANT
WATER STERILE IRR 1000ML POUR (IV SOLUTION) ×2 IMPLANT

## 2014-08-07 NOTE — Anesthesia Preprocedure Evaluation (Addendum)
Anesthesia Evaluation  Patient identified by MRN, date of birth, ID band Patient awake    Reviewed: Allergy & Precautions, H&P , NPO status , Patient's Chart, lab work & pertinent test results  History of Anesthesia Complications Negative for: history of anesthetic complications  Airway Mallampati: II TM Distance: >3 FB Neck ROM: Full    Dental  (+) Teeth Intact, Dental Advisory Given   Pulmonary neg pulmonary ROS,    Pulmonary exam normal + decreased breath sounds      Cardiovascular hypertension, Rhythm:Regular Rate:Normal     Neuro/Psych negative neurological ROS     GI/Hepatic negative GI ROS, Neg liver ROS,   Endo/Other  negative endocrine ROS  Renal/GU negative Renal ROS     Musculoskeletal  (+) Arthritis -,   Abdominal   Peds  Hematology   Anesthesia Other Findings   Reproductive/Obstetrics                          Anesthesia Physical Anesthesia Plan  ASA: III  Anesthesia Plan: General   Post-op Pain Management:    Induction: Intravenous  Airway Management Planned: Oral ETT  Additional Equipment:   Intra-op Plan:   Post-operative Plan: Possible Post-op intubation/ventilation  Informed Consent: I have reviewed the patients History and Physical, chart, labs and discussed the procedure including the risks, benefits and alternatives for the proposed anesthesia with the patient or authorized representative who has indicated his/her understanding and acceptance.   Dental advisory given  Plan Discussed with: CRNA and Anesthesiologist  Anesthesia Plan Comments:         Anesthesia Quick Evaluation

## 2014-08-07 NOTE — Op Note (Signed)
08/05/2014 - 08/07/2014  10:35 AM  PATIENT:  Michael Hill  63 y.o. male  PRE-OPERATIVE DIAGNOSIS:  Scoliosis, Stenosis, Spondylosis, s/p stage I scoliosis surgery  POST-OPERATIVE DIAGNOSIS:   Scoliosis, Stenosis, Spondylosis, s/p stage I scoliosis surgery  PROCEDURE:  Procedure(s) with comments: Stage II Lumbar percutaneous pedicle screw placement L2-S1 (N/A) - Stage II Lumbar percutaneous pedicle screw placement L2-S1  SURGEON:  Surgeon(s) and Role:    * Maeola Harman, MD - Primary    * Tia Alert, MD - Assisting  PHYSICIAN ASSISTANT:   ASSISTANTS: Poteat, RN   ANESTHESIA:   general  EBL:  Total I/O In: 2250 [I.V.:2000; IV Piggyback:250] Out: 250 [Urine:250]  BLOOD ADMINISTERED:none  DRAINS: none   LOCAL MEDICATIONS USED:  MARCAINE     SPECIMEN:  No Specimen  DISPOSITION OF SPECIMEN:  N/A  COUNTS:  YES  TOURNIQUET:  * No tourniquets in log *  DICTATION: After the smooth and uncomplicated induction of anesthesia, the patient was then turned into a prone position on the [place] table on chest rolls and using AP and lateral fluoroscopy throughout this portion of the procedure, pedicle screws were placed using Nuvasive cannulated percutaneous screws. After placing guide wires at each level, Reline towers were docked on L 2, L 3, L 4, L 5, S 1 levels bilaterally. 2 screws were placed at L 2 (6.5 x 45), 2 at L 3 6.5 x 45), 2 at L 4 (6.5 x 50), 2 at L 5 (6.5 x 45) and 2 at S1 (6.5 x 40 right and 6.5 x 45 left).140 mm rods were then bent using Bendini rod contouring system and affixed to the screw heads through a separate stab incision and locked down on the screws. All connections were then torqued and the Towers were disassembled. The wounds were irrigated and then closed with 1, 2-0 and 3-0 Vicryl stitches. Sterile occlusive dressing was placed with Dermabond. The patient was then extubated in the operating room and taken to recovery in stable and satisfactory condition having  tolerated his operation well. Counts were correct at the end of the case.  PLAN OF CARE: Admit to inpatient   PATIENT DISPOSITION:  PACU - hemodynamically stable.   Delay start of Pharmacological VTE agent (>24hrs) due to surgical blood loss or risk of bleeding: yes

## 2014-08-07 NOTE — Anesthesia Postprocedure Evaluation (Signed)
  Anesthesia Post-op Note  Patient: Michael Hill  Procedure(s) Performed: Procedure(s) with comments: Stage II Lumbar percutaneous pedicle screw placement L2-S1 (N/A) - Stage II Lumbar percutaneous pedicle screw placement L2-S1  Patient Location: PACU  Anesthesia Type:General  Level of Consciousness: awake  Airway and Oxygen Therapy: Patient Spontanous Breathing  Post-op Pain: mild  Post-op Assessment: Post-op Vital signs reviewed  Post-op Vital Signs: Reviewed  Last Vitals:  Filed Vitals:   08/07/14 1314  BP: 134/77  Pulse: 92  Temp: 37 C  Resp: 16    Complications: No apparent anesthesia complications

## 2014-08-07 NOTE — Progress Notes (Signed)
Awake, alert, conversant.  Good strength both legs.  Doing well.

## 2014-08-07 NOTE — Transfer of Care (Signed)
Immediate Anesthesia Transfer of Care Note  Patient: Michael Hill  Procedure(s) Performed: Procedure(s) with comments: Stage II Lumbar percutaneous pedicle screw placement L2-S1 (N/A) - Stage II Lumbar percutaneous pedicle screw placement L2-S1  Patient Location: PACU  Anesthesia Type:General  Level of Consciousness: awake, oriented, patient cooperative and lethargic  Airway & Oxygen Therapy: Patient Spontanous Breathing and Patient connected to nasal cannula oxygen  Post-op Assessment: Report given to PACU RN, Post -op Vital signs reviewed and stable and Patient moving all extremities X 4  Post vital signs: Reviewed and stable  Complications: No apparent anesthesia complications

## 2014-08-07 NOTE — H&P (View-Only) (Signed)
Subjective: Patient reports incisional pain overnight, now doing better  Objective: Vital signs in last 24 hours: Temp:  [97.5 F (36.4 C)-98 F (36.7 C)] 98 F (36.7 C) (09/02 0519) Pulse Rate:  [86-106] 87 (09/02 0519) Resp:  [7-20] 14 (09/02 0519) BP: (102-187)/(56-89) 119/72 mmHg (09/02 0519) SpO2:  [92 %-100 %] 97 % (09/02 0519) FiO2 (%):  [96 %-98 %] 96 % (09/02 0334)  Intake/Output from previous day: 09/01 0701 - 09/02 0700 In: 3000 [I.V.:3000] Out: 965 [Urine:815; Blood:150] Intake/Output this shift:    Physical Exam: Strength full, belly soft, dressings CDI.  Lab Results: No results found for this basename: WBC, HGB, HCT, PLT,  in the last 72 hours BMET No results found for this basename: NA, K, CL, CO2, GLUCOSE, BUN, CREATININE, CALCIUM,  in the last 72 hours  Studies/Results: Dg Lumbar Spine 2-3 Views  08/05/2014   CLINICAL DATA:  Multi-level anterior lumbar interbody fusion.  EXAM: LUMBAR SPINE - 2-3 VIEW; DG C-ARM 61-120 MIN  COMPARISON:  MRI lumbar spine 07/08/2014  FINDINGS: Multiple fluoroscopic images of the lower lumbar spine demonstrate intervertebral spacers. The visualized vertebral bodies are grossly unremarkable.  IMPRESSION: Postoperative change anterior lumbar interbody fusion.   Electronically Signed   By: Drew  Davis M.D.   On: 08/05/2014 12:50   Dg C-arm 61-120 Min  08/05/2014   CLINICAL DATA:  Multi-level anterior lumbar interbody fusion.  EXAM: LUMBAR SPINE - 2-3 VIEW; DG C-ARM 61-120 MIN  COMPARISON:  MRI lumbar spine 07/08/2014  FINDINGS: Multiple fluoroscopic images of the lower lumbar spine demonstrate intervertebral spacers. The visualized vertebral bodies are grossly unremarkable.  IMPRESSION: Postoperative change anterior lumbar interbody fusion.   Electronically Signed   By: Drew  Davis M.D.   On: 08/05/2014 12:50   Dg Or Local Abdomen  08/05/2014   CLINICAL DATA:  L5-S1 a left.  For final instrument count.  EXAM: OR LOCAL ABDOMEN  COMPARISON:   None.  FINDINGS: Frontal radiograph of the lower lumbar spine and pelvis shows a cage and associated screws for anterior lumbar interbody fusion. No unexpected foreign body. Positioning of the graft is unremarkable in the frontal projection.  There is mild lumbar levoscoliosis with accelerated degenerative disc disease, as delineated on lumbar spine MRI 05/08/2014.  These results were called by telephone at the time of interpretation on 08/05/2014 at 10:05 am to OR staff, who verbally acknowledged these results.  IMPRESSION: Hardware related to L5-S1 ALIF.  No unexpected foreign body.   Electronically Signed   By: Jonathan  Watts M.D.   On: 08/05/2014 10:06    Assessment/Plan: Mobilize in brace today.  Upright weight-bearing X rays.  Second stage tomorrow.    LOS: 1 day    Naina Sleeper D, MD 08/06/2014, 8:00 AM 

## 2014-08-07 NOTE — Brief Op Note (Signed)
08/05/2014 - 08/07/2014  10:35 AM  PATIENT:  Michael Hill  62 y.o. male  PRE-OPERATIVE DIAGNOSIS:  Scoliosis, Stenosis, Spondylosis, s/p stage I scoliosis surgery  POST-OPERATIVE DIAGNOSIS:   Scoliosis, Stenosis, Spondylosis, s/p stage I scoliosis surgery  PROCEDURE:  Procedure(s) with comments: Stage II Lumbar percutaneous pedicle screw placement L2-S1 (N/A) - Stage II Lumbar percutaneous pedicle screw placement L2-S1  SURGEON:  Surgeon(s) and Role:    * Batool Majid, MD - Primary    * David S Jones, MD - Assisting  PHYSICIAN ASSISTANT:   ASSISTANTS: Poteat, RN   ANESTHESIA:   general  EBL:  Total I/O In: 2250 [I.V.:2000; IV Piggyback:250] Out: 250 [Urine:250]  BLOOD ADMINISTERED:none  DRAINS: none   LOCAL MEDICATIONS USED:  MARCAINE     SPECIMEN:  No Specimen  DISPOSITION OF SPECIMEN:  N/A  COUNTS:  YES  TOURNIQUET:  * No tourniquets in log *  DICTATION: After the smooth and uncomplicated induction of anesthesia, the patient was then turned into a prone position on the [place] table on chest rolls and using AP and lateral fluoroscopy throughout this portion of the procedure, pedicle screws were placed using Nuvasive cannulated percutaneous screws. After placing guide wires at each level, Reline towers were docked on L 2, L 3, L 4, L 5, S 1 levels bilaterally. 2 screws were placed at L 2 (6.5 x 45), 2 at L 3 6.5 x 45), 2 at L 4 (6.5 x 50), 2 at L 5 (6.5 x 45) and 2 at S1 (6.5 x 40 right and 6.5 x 45 left).140 mm rods were then bent using Bendini rod contouring system and affixed to the screw heads through a separate stab incision and locked down on the screws. All connections were then torqued and the Towers were disassembled. The wounds were irrigated and then closed with 1, 2-0 and 3-0 Vicryl stitches. Sterile occlusive dressing was placed with Dermabond. The patient was then extubated in the operating room and taken to recovery in stable and satisfactory condition having  tolerated his operation well. Counts were correct at the end of the case.  PLAN OF CARE: Admit to inpatient   PATIENT DISPOSITION:  PACU - hemodynamically stable.   Delay start of Pharmacological VTE agent (>24hrs) due to surgical blood loss or risk of bleeding: yes  

## 2014-08-07 NOTE — Progress Notes (Signed)
Michael Hill Cancellation Note  Patient Details Name: Michael Hill MRN: [id number] DOB: Apr 07, 1951   Cancelled Treatment:    Reason Eval/Treat Not Completed: Medical issues which prohibited therapy.  Recently back from OR s/p more back surgery.  Will see 9/4.   08/07/2014  Michael Hill, Michael Hill [phone]  (pager) Nakai Pollio, Eliseo Gum 08/07/2014, 2:42 PM

## 2014-08-07 NOTE — Anesthesia Procedure Notes (Signed)
Procedure Name: Intubation Date/Time: 08/07/2014 7:42 AM Performed by: Angelica Pou Pre-anesthesia Checklist: Patient identified, Timeout performed, Emergency Drugs available, Suction available and Patient being monitored Patient Re-evaluated:Patient Re-evaluated prior to inductionOxygen Delivery Method: Circle system utilized Preoxygenation: Pre-oxygenation with 100% oxygen Intubation Type: IV induction Ventilation: Mask ventilation without difficulty and Oral airway inserted - appropriate to patient size Laryngoscope Size: Mac and 4 Grade View: Grade I Tube type: Oral Tube size: 7.5 mm Number of attempts: 1 Airway Equipment and Method: Stylet and Oral airway Placement Confirmation: ETT inserted through vocal cords under direct vision,  breath sounds checked- equal and bilateral and positive ETCO2 Secured at: 23 cm Tube secured with: Tape Dental Injury: Teeth and Oropharynx as per pre-operative assessment

## 2014-08-07 NOTE — Progress Notes (Signed)
Pt arrived to room from OR with two separate PCA orders.  Per Dr Venetia Maxon do not administer either PCA continue to use PRN pain medication in [facility].  Will continue to monitor. Sondra Come, RN 08/07/2014 1:00 PM

## 2014-08-07 NOTE — Interval H&P Note (Signed)
History and Physical Interval Note:  08/07/2014 7:22 AM  Michael Hill  has presented today for surgery, with the diagnosis of Scoliosis, Stenosis, Spondylosis  The various methods of treatment have been discussed with the patient and family. After consideration of risks, benefits and other options for treatment, the patient has consented to  Procedure(s) with comments: Stage II Lumbar percutaneous pedicle screw placement L2-L5 (N/A) - Stage II Lumbar percutaneous pedicle screw placement L2-L5 as a surgical intervention .  The patient's history has been reviewed, patient examined, no change in status, stable for surgery.  I have reviewed the patient's chart and labs.  Questions were answered to the patient's satisfaction.     Teka Chanda D

## 2014-08-08 MED ORDER — PANTOPRAZOLE SODIUM 40 MG PO TBEC
40.0000 mg | DELAYED_RELEASE_TABLET | Freq: Every day | ORAL | Status: DC
  Administered 2014-08-08 – 2014-08-11 (×4): 40 mg via ORAL
  Filled 2014-08-08 (×4): qty 1

## 2014-08-08 NOTE — Progress Notes (Signed)
Occupational Therapy Treatment Patient Details Name: Michael Hill MRN: [id number] DOB: Sep 25, 1951 Today's Date: 08/08/2014    History of present illness 63 y.o. male s/p Dr Rubye Oaks anterior PLIF L5-S1. Dr Venetia Maxon PLIF L2-3 L3-4 L4-5. Planned second stage 08/07/14. LSO brace provided to patient pta. PMH: HTN , obese, HLD, chronic back pain.  Now s/p second stage  Lumbar percutaneous pedicle screw placement L2-S1.   OT comments  Pt progressing toward goals slowly. Pt fatigues quickly with activity and requires frequent rest breaks. Ot to continue to follow acutely.   Follow Up Recommendations  No OT follow up    Equipment Recommendations  Other (comment)    Recommendations for Other Services      Precautions / Restrictions Precautions Precautions: Back Required Braces or Orthoses: Spinal Brace Spinal Brace: Applied in sitting position;Lumbar corset       Mobility Bed Mobility   Bed Mobility: Sit to Supine Rolling: Supervision Sidelying to sit: Supervision   Sit to supine: Supervision   General bed mobility comments: pt able to complete task at adequate level for d/c  Transfers   Equipment used: Rolling walker (2 wheeled) Transfers: Sit to/from Stand Sit to Stand: Supervision         General transfer comment: for safety    Balance   Sitting-balance support: No upper extremity supported Sitting balance-Leahy Scale: Good                         High Level Balance Comments: pt able to manage bathroom without deficits with RW   ADL       Grooming: Oral care;Min guard;Standing Grooming Details (indicate cue type and reason): pt reports fatigued after ambulation with PT Cyndi and static standing for adl                             Functional mobility during ADLs: Min guard;Rolling walker General ADL Comments: Pt reports decr pain compared to 08/07/14 and remains at evaluation level of (A). pt s wife educated on shower transfer with 3n1 and  sitting for initial showers. Pt requesting return to supine after sink level task due to fatigue. Pt encouraged to sit up for meal and declined and reports "ill get up when it is here". Pt doff brace MOD I      Vision                     Perception     Praxis      Cognition   Behavior During Therapy: WFL for tasks assessed/performed Overall Cognitive Status: Within Functional Limits for tasks assessed                       Extremity/Trunk Assessment               Exercises     Shoulder Instructions       General Comments      Pertinent Vitals/ Pain       Pain Score: 4  Pain Intervention(s): Monitored during session  Home Living                                          Prior Functioning/Environment              Frequency Min 2X/week  Progress Toward Goals  OT Goals(current goals can now be found in the care plan section)  Progress towards OT goals: Progressing toward goals  Acute Rehab OT Goals Patient Stated Goal: to be independent OT Goal Formulation: With patient/family Time For Goal Achievement: 08/20/14 Potential to Achieve Goals: Good ADL Goals Pt Will Perform Lower Body Bathing: with supervision;with adaptive equipment;sit to/from stand Pt Will Perform Lower Body Dressing: with supervision;with adaptive equipment;sit to/from stand Pt Will Perform Tub/Shower Transfer: with supervision;Tub transfer;ambulating;rolling walker Additional ADL Goal #1: Pt will complete bed mobility supervision level without HOB elevated and no bed rails  Plan Discharge plan remains appropriate    Co-evaluation                 End of Session Equipment Utilized During Treatment: Gait belt;Rolling walker;Back brace   Activity Tolerance Patient limited by fatigue   Patient Left in bed;with call bell/phone within reach;with family/visitor present   Nurse Communication Mobility status;Precautions        Time:  1610-9604 OT Time Calculation (min): 10 min  Charges: OT General Charges $OT Visit: 1 Procedure OT Treatments $Self Care/Home Management : 8-22 mins  Boone Master B 08/08/2014, 1:19 PM Pager: [phone]

## 2014-08-08 NOTE — Progress Notes (Signed)
Physical Therapy Treatment Patient Details Name: Michael Hill MRN: [id number] DOB: 1951/03/09 Today's Date: 08/08/2014    History of Present Illness 63 y.o. male s/p Dr Rubye Oaks anterior PLIF L5-S1. Dr Venetia Maxon PLIF L2-3 L3-4 L4-5. Planned second stage 08/07/14. LSO brace provided to patient pta. PMH: HTN , obese, HLD, chronic back pain.  Now s/p second stage  Lumbar percutaneous pedicle screw placement L2-S1.    PT Comments    Patient progressing with mobility practicing stairs this session.  Agree with no follow up PT at this time.  May need outpatient for strengthening when cleared by MD.  Follow Up Recommendations  No PT follow up;Supervision for mobility/OOB     Equipment Recommendations  3in1 (PT)    Recommendations for Other Services       Precautions / Restrictions Precautions Precautions: Back Required Braces or Orthoses: Spinal Brace Spinal Brace: Applied in sitting position;Lumbar corset    Mobility  Bed Mobility     Rolling: Supervision Sidelying to sit: Supervision          Transfers   Equipment used: Rolling walker (2 wheeled) Transfers: Sit to/from Stand Sit to Stand: Supervision         General transfer comment: for safety  Ambulation/Gait Ambulation/Gait assistance: Min guard;Supervision Ambulation Distance (Feet): 200 Feet Assistive device: Rolling walker (2 wheeled) Gait Pattern/deviations: Step-through pattern     General Gait Details: cues for posture   Stairs Stairs: Yes Stairs assistance: Supervision Stair Management: Step to pattern;Sideways;Forwards;One rail Left;Two rails Number of Stairs: 2 (practiced x 4 trials) General stair comments: cues for sequence, pt practiced leading with different feet during trials to see which worked best.  Educated on sideways technique due to needing to use one rail at home  Wheelchair Mobility    Modified Rankin (Stroke Patients Only)       Balance   Sitting-balance support: No upper  extremity supported Sitting balance-Leahy Scale: Good                              Cognition Arousal/Alertness: Awake/alert Behavior During Therapy: WFL for tasks assessed/performed Overall Cognitive Status: Within Functional Limits for tasks assessed                      Exercises      General Comments General comments (skin integrity, edema, etc.): reviewed back precautions, discussed with wife car transfers and not to get onto floor until cleared by MD.      Pertinent Vitals/Pain Pain Score: 4  Pain Intervention(s): Monitored during session    Home Living                      Prior Function            PT Goals (current goals can now be found in the care plan section) Progress towards PT goals: Progressing toward goals    Frequency  Min 5X/week    PT Plan Current plan remains appropriate    Co-evaluation             End of Session Equipment Utilized During Treatment: Back brace Activity Tolerance: Patient tolerated treatment well Patient left: with call bell/phone within reach;in bed     Time: 1115-1140 PT Time Calculation (min): 25 min  Charges:  $Gait Training: 8-22 mins $Self Care/Home Management: 8-22  G Codes:      Michael Hill,Michael Hill 08/08/2014, 12:05 PM Sheran Lawless, PT [phone] 08/08/2014

## 2014-08-08 NOTE — Progress Notes (Signed)
Subjective: Patient reports "I've had a lot of pain..It's better this morning"  Objective: Vital signs in last 24 hours: Temp:  [98 F (36.7 C)-99.4 F (37.4 C)] 98.7 F (37.1 C) (09/04 0525) Pulse Rate:  [81-103] 81 (09/04 0525) Resp:  [13-23] 14 (09/04 0525) BP: (107-160)/(54-85) 113/54 mmHg (09/04 0525) SpO2:  [93 %-99 %] 96 % (09/04 0525) Weight:  [101.6 kg (223 lb 15.8 oz)] 101.6 kg (223 lb 15.8 oz) (09/04 0405)  Intake/Output from previous day: 09/03 0701 - 09/04 0700 In: 2453 [I.V.:2203; IV Piggyback:250] Out: 1625 [Urine:1525; Blood:100] Intake/Output this shift:    Ambulating from bathroom. Incisional pain this morning;  Sacral/buttock pain with sitting >91minutes. No leg pain reported. Good strength BLE. Incisions with Dermabond & honeycomb drsgs. No erythema, swelling, or drainage. Diet advancing today. No BM yet, but denies discomfort.  Lab Results: No results found for this basename: WBC, HGB, HCT, PLT,  in the last 72 hours BMET No results found for this basename: NA, K, CL, CO2, GLUCOSE, BUN, CREATININE, CALCIUM,  in the last 72 hours  Studies/Results: Dg Thoracolumbar Erect  08/06/2014   CLINICAL DATA:  Scoliosis correction.  EXAM: THORACOLUMBAR SCOLIOSIS STUDY - STANDING VIEWS  COMPARISON:  04/22/2014 and 08/05/2014  FINDINGS: Exam demonstrates very subtle curvature of the lumbar spine convex to the left unchanged. There is mild to moderate spondylosis throughout the spine. There is no compression fracture or subluxation. Fusion hardware is present over the anterior aspect of the L5-S1 level with 2 screws over the L5 vertebral body and a single left-sided screw over the S1 body. Intervertebral cages are present from the L2-3 level to the L5-S1 level. There is mild disc space narrowing at T12-L1 level and L1-2 levels. Facet arthropathy is present.  IMPRESSION: Mild to moderate spondylosis to include moderate facet arthropathy. Anterior fusion hardware intact at the L5-S1  level. Intervertebral cages from the L2-3 level to the L5-S1 level.  Subtle curvature convex left unchanged.   Electronically Signed   By: Elberta Fortis M.D.   On: 08/06/2014 09:58   Dg Lumbar Spine Complete  08/07/2014   CLINICAL DATA:  Postop pedicle screw placement  EXAM: DG C-ARM 61-120 MIN; LUMBAR SPINE - COMPLETE 4+ VIEW  TECHNIQUE: Four intraoperative views of the lumbar spine submitted.  CONTRAST:  None  FLUOROSCOPY TIME:  2 min 30 seconds  COMPARISON:  09/01/ 15  FINDINGS: Four views of the lumbar spine submitted. Postsurgical changes are noted with posterior metallic fusion with rods and screws from L2-S1 level. There is anatomic alignment. Interbody fusion also noted.  IMPRESSION: Status post posterior metallic fusion L2-S1 level with metallic rods and screws. There is anatomic alignment.   Electronically Signed   By: Natasha Mead M.D.   On: 08/07/2014 10:36   Dg C-arm 61-120 Min  08/07/2014   CLINICAL DATA:  Postop pedicle screw placement  EXAM: DG C-ARM 61-120 MIN; LUMBAR SPINE - COMPLETE 4+ VIEW  TECHNIQUE: Four intraoperative views of the lumbar spine submitted.  CONTRAST:  None  FLUOROSCOPY TIME:  2 min 30 seconds  COMPARISON:  09/01/ 15  FINDINGS: Four views of the lumbar spine submitted. Postsurgical changes are noted with posterior metallic fusion with rods and screws from L2-S1 level. There is anatomic alignment. Interbody fusion also noted.  IMPRESSION: Status post posterior metallic fusion L2-S1 level with metallic rods and screws. There is anatomic alignment.   Electronically Signed   By: Natasha Mead M.D.   On: 08/07/2014 10:36  Assessment/Plan: Improving   LOS: 3 days  Mobilize in LSO with PT. Advancing diet, monitoring bowels. PCA orders & TID Norco orders d/c'ed per DrStern's order. Pt will ask for Percocet &/or muscle relaxer PRN.    Georgiann Cocker 08/08/2014, 7:57 AM

## 2014-08-08 NOTE — Progress Notes (Signed)
Patient foley removed at 6:45 am. Patient unable to void x2 sufficiently only able to dribble a little bit at a time.  In and out cath performed X2 with sterile technique. At 1900 patients wife called RN because patient felt urge to void but unable to. Foley catheter placed by Shanda Bumps, RN per protocol and dark yellow urine with a few tiny clots was retrieved from bladder. Patient expresses instant relief. Will pass on for night shift to monitor.  Michael Hill, Michael Hill 08/08/2014 8:16 PM

## 2014-08-09 MED ORDER — TAMSULOSIN HCL 0.4 MG PO CAPS
0.4000 mg | ORAL_CAPSULE | Freq: Every day | ORAL | Status: DC
  Administered 2014-08-09 – 2014-08-11 (×3): 0.4 mg via ORAL
  Filled 2014-08-09 (×3): qty 1

## 2014-08-09 NOTE — Progress Notes (Signed)
Subjective: Patient reports still quite sore and uncomfortable due to urinary retention.  Objective: Vital signs in last 24 hours: Temp:  [98.8 F (37.1 C)-100.1 F (37.8 C)] 99.5 F (37.5 C) (09/05 0549) Pulse Rate:  [78-88] 85 (09/05 0549) Resp:  [18-20] 18 (09/05 0549) BP: (97-126)/(44-65) 118/64 mmHg (09/05 0549) SpO2:  [95 %-98 %] 98 % (09/05 0549)  Intake/Output from previous day: 09/04 0701 - 09/05 0700 In: -  Out: 2950 [Urine:2950] Intake/Output this shift: Total I/O In: -  Out: 1325 [Urine:1325]  Physical Exam: Strength full.   Dressings CDI.  Lab Results: No results found for this basename: WBC, HGB, HCT, PLT,  in the last 72 hours BMET No results found for this basename: NA, K, CL, CO2, GLUCOSE, BUN, CREATININE, CALCIUM,  in the last 72 hours  Studies/Results: Dg Lumbar Spine Complete  08/07/2014   CLINICAL DATA:  Postop pedicle screw placement  EXAM: DG C-ARM 61-120 MIN; LUMBAR SPINE - COMPLETE 4+ VIEW  TECHNIQUE: Four intraoperative views of the lumbar spine submitted.  CONTRAST:  None  FLUOROSCOPY TIME:  2 min 30 seconds  COMPARISON:  09/01/ 15  FINDINGS: Four views of the lumbar spine submitted. Postsurgical changes are noted with posterior metallic fusion with rods and screws from L2-S1 level. There is anatomic alignment. Interbody fusion also noted.  IMPRESSION: Status post posterior metallic fusion L2-S1 level with metallic rods and screws. There is anatomic alignment.   Electronically Signed   By: Natasha Mead M.D.   On: 08/07/2014 10:36   Dg C-arm 61-120 Min  08/07/2014   CLINICAL DATA:  Postop pedicle screw placement  EXAM: DG C-ARM 61-120 MIN; LUMBAR SPINE - COMPLETE 4+ VIEW  TECHNIQUE: Four intraoperative views of the lumbar spine submitted.  CONTRAST:  None  FLUOROSCOPY TIME:  2 min 30 seconds  COMPARISON:  09/01/ 15  FINDINGS: Four views of the lumbar spine submitted. Postsurgical changes are noted with posterior metallic fusion with rods and screws from L2-S1  level. There is anatomic alignment. Interbody fusion also noted.  IMPRESSION: Status post posterior metallic fusion L2-S1 level with metallic rods and screws. There is anatomic alignment.   Electronically Signed   By: Natasha Mead M.D.   On: 08/07/2014 10:36    Assessment/Plan: Improving pain control, continue Foley for bladder rest.  Will start Flomax.  Work on mobility today and pain control prior to D/C.    LOS: 4 days    Dorian Heckle, MD 08/09/2014, 6:28 AM

## 2014-08-09 NOTE — Progress Notes (Signed)
Physical Therapy Treatment Patient Details Name: Michael Hill MRN: [id number] DOB: [birth year] Today's Date: 08/09/2014    History of Present Illness 63 y.o. male s/p Dr Rubye Oaks anterior PLIF L5-S1. Dr Venetia Maxon PLIF L2-3 L3-4 L4-5. Planned second stage 08/07/14. LSO brace provided to patient pta. PMH: HTN , obese, HLD, chronic back pain.  Now s/p second stage  Lumbar percutaneous pedicle screw placement L2-S1.    PT Comments    Pt is moving well, reporting normal pain in his back.  He walked the whole unit today and was still able to stand after that for grooming tasks at the sink in the bathroom.  He has limited tolerance of sitting due to pain.  Reinforced back education, walking for exercise, and no sitting for greater than 30-45 mins at a time.  Pt verbalized understanding.  If he continues to do this well, we may start trying shorter distance gait without RW to encourage more upright posture during gait.   Follow Up Recommendations  No PT follow up;Supervision for mobility/OOB     Equipment Recommendations  3in1 (PT)    Recommendations for Other Services   NA     Precautions / Restrictions Precautions Precautions: Back Precaution Booklet Issued: Yes (comment) (given in previous session) Precaution Comments: reviewed back precautions, pt able to report 3/3 Required Braces or Orthoses: Spinal Brace Spinal Brace: Applied in sitting position;Lumbar corset Restrictions Weight Bearing Restrictions: No    Mobility  Bed Mobility Overal bed mobility: Needs Assistance Bed Mobility: Rolling;Sidelying to Sit Rolling: Supervision Sidelying to sit: Supervision       General bed mobility comments: Verbal cues for correct log roll technique, pt using bedrail for leverage.   Transfers Overall transfer level: Needs assistance Equipment used: Rolling walker (2 wheeled) Transfers: Sit to/from Stand Sit to Stand: Supervision         General transfer comment: supervision for safety  due to slow speed of movement and painful expression on his face.   Ambulation/Gait Ambulation/Gait assistance: Min guard;Supervision Ambulation Distance (Feet): 510 Feet Assistive device: Rolling walker (2 wheeled) Gait Pattern/deviations: Step-through pattern;Shuffle;Trunk flexed Gait velocity: decreased   General Gait Details: cues for upright posture.  Started out as min guard assist for safety, but by second half of the walk pt improved to supervision.  Pt had no signs of leg buckling, pain or weakness, only reports of back pain.           Balance Overall balance assessment: Needs assistance Sitting-balance support: Feet supported;No upper extremity supported Sitting balance-Leahy Scale: Good     Standing balance support: Bilateral upper extremity supported;No upper extremity supported Standing balance-Leahy Scale: Fair Standing balance comment: pt able to preform grooming tasks at sink working on abiding by back precautions and keeping his balance without the support of the walker.                     Cognition Arousal/Alertness: Awake/alert Behavior During Therapy: WFL for tasks assessed/performed Overall Cognitive Status: Within Functional Limits for tasks assessed                             Pertinent Vitals/Pain Pain Assessment: 0-10 Pain Score: 5  Pain Location: back, incision Pain Descriptors / Indicators: Aching;Burning Pain Intervention(s): Limited activity within patient's tolerance;Monitored during session;Repositioned;Premedicated before session           PT Goals (current goals can now be found in the care plan section)  Acute Rehab PT Goals Patient Stated Goal: to be independent Progress towards PT goals: Progressing toward goals    Frequency  Min 5X/week    PT Plan Current plan remains appropriate       End of Session Equipment Utilized During Treatment: Gait belt;Back brace Activity Tolerance: Patient limited by  pain Patient left: in chair;with call bell/phone within reach     Time: 0930-0952 PT Time Calculation (min): 22 min  Charges:  $Gait Training: 8-22 mins                      Chanse Kagel B. Jocelin Schuelke, PT, DPT [phone]   08/09/2014, 10:33 AM

## 2014-08-10 MED ORDER — OXYCODONE HCL 5 MG PO TABS
10.0000 mg | ORAL_TABLET | ORAL | Status: DC | PRN
  Administered 2014-08-10: 10 mg via ORAL
  Filled 2014-08-10: qty 2

## 2014-08-10 MED ORDER — METHOCARBAMOL 500 MG PO TABS: 500.0000 mg | ORAL_TABLET | Freq: Four times a day (QID) | ORAL | Status: DC | PRN

## 2014-08-10 MED ORDER — TAMSULOSIN HCL 0.4 MG PO CAPS: 0.4000 mg | ORAL_CAPSULE | Freq: Every day | ORAL | Status: DC

## 2014-08-10 MED ORDER — OXYCODONE-ACETAMINOPHEN 10-325 MG PO TABS: 1.0000 | ORAL_TABLET | ORAL | Status: DC | PRN

## 2014-08-10 NOTE — Progress Notes (Signed)
Subjective: Patient reports sore in back  Objective: Vital signs in last 24 hours: Temp:  [97.3 F (36.3 C)-99.1 F (37.3 C)] 98.6 F (37 C) (09/06 0537) Pulse Rate:  [68-97] 68 (09/06 0537) Resp:  [18-20] 20 (09/06 0537) BP: (100-131)/(49-67) 116/63 mmHg (09/06 0537) SpO2:  [93 %-100 %] 100 % (09/06 0537)  Intake/Output from previous day: 09/05 0701 - 09/06 0700 In: 360 [P.O.:360] Out: 2000 [Urine:2000] Intake/Output this shift: Total I/O In: 300 [P.O.:300] Out: -   Physical Exam: Strength full.  Dressings CDI  Lab Results: No results found for this basename: WBC, HGB, HCT, PLT,  in the last 72 hours BMET No results found for this basename: NA, K, CL, CO2, GLUCOSE, BUN, CREATININE, CALCIUM,  in the last 72 hours  Studies/Results: No results found.  Assessment/Plan: Mobilizing slowly, continue PT and Home Health initiated.  D/C Foley.  Work on bowels.    LOS: 5 days    Dorian Heckle, MD 08/10/2014, 9:46 AM

## 2014-08-10 NOTE — Care Management Note (Signed)
    Page 1 of 1   08/10/2014     1:45:53 PM CARE MANAGEMENT NOTE 08/10/2014  Patient:  EITAN, DOUBLEDAY   Account Number:  [id number]  Date Initiated:  08/06/2014  Documentation initiated by:  Elmer Bales  Subjective/Objective Assessment:   Patient was admitted for ALIF. Lives at home with spouse.     Action/Plan:   Will follow for discharge needs pending PT/OT evals and physician orders.   Anticipated DC Date:     Anticipated DC Plan:  HOME/SELF CARE      DC Planning Services  CM consult      Choice offered to / List presented to:             Status of service:  Completed, signed off Medicare Important Message given?   (If response is "NO", the following Medicare IM given date fields will be blank) Date Medicare IM given:   Medicare IM given by:   Date Additional Medicare IM given:   Additional Medicare IM given by:    Discharge Disposition:  HOME/SELF CARE  Per UR Regulation:  Reviewed for med. necessity/level of care/duration of stay  If discussed at Long Length of Stay Meetings, dates discussed:    Comments:  08/10/14 13:40 CM notes no PT/OT follow up recc; 3n1 recc; DEM rep to deliver to room prior to discharge.  No other CM needs were communicated.  Freddy Jaksch, BSN, CM [phone].

## 2014-08-10 NOTE — Discharge Summary (Addendum)
Physician Discharge Summary  Patient ID: MURLIN SCHRIEBER MRN: [id number] DOB/AGE: [birth year] 63 y.o.  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  Stage I was ALIF L 5 S 1 with XLIF l 23, L 34, L 45 levels; Stage II was percutaneous pedicle screw fixation L 2 - S 1 levels.  Patient had post-operative urinary retention which required Foley and Flomax.  He gradually mobilized with PT and was dischaged home with Home Health services.  Consults: None  Significant Diagnostic Studies: None  Treatments: surgery: two stage decompression and fusion surgery for lumbar scoliosis and spondylosis.  Stage I was ALIF L 5 S 1 with XLIF l 23, L 34, L 45 levels; Stage II was percutaneous pedicle screw fixation L 2 - S 1 levels  Discharge Exam: Blood pressure 124/65, pulse 74, temperature 98.2 F (36.8 C), temperature source Oral, resp. rate 20, height  (1.702 m), weight 101.6 kg (223 lb 15.8 oz), SpO2 96.00%. Neurologic: Alert and oriented X 3, normal strength and tone. Normal symmetric reflexes. Normal coordination and gait Wound:CDI  Disposition: Home  Discharge Instructions   Ambulatory referral to Home Health    Complete by:  As directed   Please evaluate [name] for admission to [facility].  Disciplines requested: Physical Therapy  Services to provide: Strengthening Exercises  Physician to follow patient's care (the person listed here will be responsible for signing ongoing orders): Referring Provider  Requested Start of Care Date: Tomorrow  I certify that this patient is under my care and  that I, or a Nurse Practitioner or Physician's Assistant working with me, had a face-to-face encounter that meets the physician face-to-face requirements with patient on 08/10/14. The encounter with the patient was in whole, or in part for the following medical condition(s) which is the primary reason for home health care (List medical condition). Scoliosis and surgery for spinal deformity  Special Instructions:  None  The encounter with the patient was in whole, or in part, for the following medical condition, which is the primary reason for home health care:  yes  I certify that, based on my findings, the following services are medically necessary home health services:  Physical therapy  My clinical findings support the need for the above services:  Pain interferes with ambulation/mobility  Further, I certify that my clinical findings support that this patient is homebound due to:  Ambulates short distances less than 300 feet  Reason for Medically Necessary Home Health Services:  Therapy- Therapeutic Exercises to Increase Strength and Endurance  Does the patient have Medicare or Medicaid?:  No            Medication List         amLODipine-benazepril 5-10 MG per capsule  Commonly known as:  LOTREL  Take 1 capsule by mouth daily.     atorvastatin 10 MG tablet  Commonly known as:  LIPITOR  Take 10 mg by mouth daily.     HYDROcodone-acetaminophen 5-325 MG per tablet  Commonly known as:  NORCO/VICODIN  Take 1 tablet by mouth every 8 (eight) hours.     methocarbamol 500 MG tablet  Commonly known as:  ROBAXIN  Take 1 tablet (500 mg total) by mouth every 6 (six) hours as needed for muscle spasms.     oxyCODONE-acetaminophen 10-325 MG per tablet  Commonly known as:  PERCOCET  Take 1 tablet by mouth every 3 (three) hours as needed for pain.     tamsulosin 0.4 MG Caps capsule  Commonly known as:  FLOMAX  Take 1 capsule (0.4 mg total) by mouth daily.         Signed: Dorian Heckle,  MD 08/10/2014, 9:48 AM   Patient was unable to void after Foley was removed.  The catheter was replaced.  The patient will be discharged home with indwelling Foley and will follow up with Urologists middle of this week for catheter removal and voiding trial.

## 2014-08-11 NOTE — Progress Notes (Signed)
Discharge orders received.  Discharge instructions and follow-up appointments reviewed with the patient.  IV removed and education complete.  VSS upon discharge.  Transported out via wheelchair, family present. Sondra Come, RN 08/11/2014 11:08 AM

## 2014-08-11 NOTE — Progress Notes (Signed)
Occupational Therapy Treatment Patient Details Name: Michael Hill MRN: [id number] DOB: [birth year] Today's Date: 08/11/2014    History of present illness 63 y.o. male s/p Dr Rubye Oaks anterior PLIF L5-S1. Dr Venetia Maxon PLIF L2-3 L3-4 L4-5. Planned second stage 08/07/14. LSO brace provided to patient pta. PMH: HTN , obese, HLD, chronic back pain.  Now s/p second stage  Lumbar percutaneous pedicle screw placement L2-S1.   OT comments  All education is complete and patient indicates understanding.Pt is at adequate level for d/ chome with good family support. Pt declining to purchase AE for LB and plans to have family (A). Pt with much improved pain management and progressing well.    Follow Up Recommendations  No OT follow up    Equipment Recommendations  Other (comment)    Recommendations for Other Services      Precautions / Restrictions Precautions Precautions: Back Required Braces or Orthoses: Spinal Brace Spinal Brace: Applied in sitting position;Lumbar corset       Mobility Bed Mobility Overal bed mobility: Modified Independent                Transfers Overall transfer level: Needs assistance Equipment used: Rolling walker (2 wheeled) Transfers: Sit to/from Stand Sit to Stand: Supervision              Balance                                   ADL Overall ADL's : Needs assistance/impaired Eating/Feeding: Independent;Sitting   Grooming: Brushing hair;Sitting;Modified independent               Lower Body Dressing: Moderate assistance;Bed level Lower Body Dressing Details (indicate cue type and reason): pt attempting to cross BIL LE sitting and supine. pt educaetd on use of Reacher adn locations available to purchase. pt reports "maybe I shouldnt so I dont even get close to doing what he doesnt want me to do . I have good help at home" Toilet Transfer: Supervision/safety           Functional mobility during ADLs:  Supervision/safety General ADL Comments: Pt ambulated around entire unit once > 100 ft. Pt educated on schedule at home for mobility and sitting 30 minutes at a time with change of position. pt independent with donning brace. Pt agreeable to putting on clothes for home but only has shorts. Pt requesting to wait until wife arrives.       Vision                     Perception     Praxis      Cognition   Behavior During Therapy: WFL for tasks assessed/performed Overall Cognitive Status: Within Functional Limits for tasks assessed                       Extremity/Trunk Assessment               Exercises     Shoulder Instructions       General Comments      Pertinent Vitals/ Pain       Pain Assessment: 0-10 Pain Score: 5  Pain Location: back surgery site Pain Intervention(s): Repositioned;Premedicated before session  Home Living  Prior Functioning/Environment              Frequency Min 2X/week     Progress Toward Goals  OT Goals(current goals can now be found in the care plan section)  Progress towards OT goals: Progressing toward goals  Acute Rehab OT Goals Patient Stated Goal: to be independent OT Goal Formulation: With patient/family Time For Goal Achievement: 08/20/14 Potential to Achieve Goals: Good ADL Goals Pt Will Perform Lower Body Bathing: with supervision;with adaptive equipment;sit to/from stand Pt Will Perform Lower Body Dressing: with supervision;with adaptive equipment;sit to/from stand Pt Will Perform Tub/Shower Transfer: with supervision;Tub transfer;ambulating;rolling walker Additional ADL Goal #1: Pt will complete bed mobility supervision level without HOB elevated and no bed rails  Plan Discharge plan remains appropriate    Co-evaluation                 End of Session     Activity Tolerance Patient tolerated treatment well   Patient Left in  bed;with call bell/phone within reach   Nurse Communication Mobility status;Precautions        Time: 0960-4540 OT Time Calculation (min): 19 min  Charges: OT General Charges $OT Visit: 1 Procedure OT Treatments $Self Care/Home Management : 8-22 mins  Boone Master B 08/11/2014, 10:40 AM  Pager: [phone]

## 2014-08-11 NOTE — Progress Notes (Signed)
Pt voided a total of about  thrice since 1900, bladder scan done at 0035, scanned about retained, #16 fr size catheter inserted at 0050 as ordered, about 550 ml of clear urine drained out,catheter left in place with leg strap,pt reassured, will continue to monitor. Obasogie-Asidi, Trella Thurmond Efe

## 2014-08-11 NOTE — Progress Notes (Signed)
Subjective: Patient reports ready to go home  Objective: Vital signs in last 24 hours: Temp:  [97.6 F (36.4 C)-99.1 F (37.3 C)] 99.1 F (37.3 C) (09/07 0556) Pulse Rate:  [66-85] 78 (09/07 0556) Resp:  [20-22] 20 (09/07 0556) BP: (107-140)/(59-75) 107/61 mmHg (09/07 0556) SpO2:  [96 %-99 %] 97 % (09/07 0556)  Intake/Output from previous day: 09/06 0701 - 09/07 0700 In: 660 [P.O.:660] Out: 3100 [Urine:3100] Intake/Output this shift:    Physical Exam:   Lab Results: No results found for this basename: WBC, HGB, HCT, PLT,  in the last 72 hours BMET No results found for this basename: NA, K, CL, CO2, GLUCOSE, BUN, CREATININE, CALCIUM,  in the last 72 hours  Studies/Results: No results found.  Assessment/Plan: To go home with Foley and bladder rest and follow up with Urologist middle of the week.    LOS: 6 days    Dorian Heckle, MD 08/11/2014, 8:45 AM

## 2014-08-12 ENCOUNTER — Encounter (HOSPITAL_COMMUNITY): Payer: Self-pay | Admitting: Neurosurgery

## 2014-08-22 LAB — POCT I-STAT 4, (NA,K, GLUC, HGB,HCT)
GLUCOSE: 125 mg/dL — AB (ref 70–99)
HCT: 31 % — ABNORMAL LOW (ref 39.0–52.0)
Hemoglobin: 10.5 g/dL — ABNORMAL LOW (ref 13.0–17.0)
Potassium: 5 mEq/L (ref 3.7–5.3)
Sodium: 134 mEq/L — ABNORMAL LOW (ref 137–147)

## 2016-02-02 ENCOUNTER — Other Ambulatory Visit (HOSPITAL_COMMUNITY): Payer: Self-pay | Admitting: Neurosurgery

## 2016-02-02 ENCOUNTER — Other Ambulatory Visit: Payer: Self-pay | Admitting: Neurosurgery

## 2016-02-02 DIAGNOSIS — M48061 Spinal stenosis, lumbar region without neurogenic claudication: Secondary | ICD-10-CM

## 2016-03-01 ENCOUNTER — Ambulatory Visit (HOSPITAL_COMMUNITY)
Admission: RE | Admit: 2016-03-01 | Discharge: 2016-03-01 | Disposition: A | Discharge status: Home / Self Care | Payer: BC Managed Care – PPO | Source: Ambulatory Visit | Attending: Neurosurgery | Admitting: Neurosurgery

## 2016-03-01 ENCOUNTER — Other Ambulatory Visit (HOSPITAL_COMMUNITY): Payer: BC Managed Care – PPO

## 2016-03-01 DIAGNOSIS — Y838 Other surgical procedures as the cause of abnormal reaction of the patient, or of later complication, without mention of misadventure at the time of the procedure: Secondary | ICD-10-CM | POA: Diagnosis not present

## 2016-03-01 DIAGNOSIS — I708 Atherosclerosis of other arteries: Secondary | ICD-10-CM | POA: Diagnosis not present

## 2016-03-01 DIAGNOSIS — E079 Disorder of thyroid, unspecified: Secondary | ICD-10-CM | POA: Insufficient documentation

## 2016-03-01 DIAGNOSIS — M4607 Spinal enthesopathy, lumbosacral region: Secondary | ICD-10-CM | POA: Insufficient documentation

## 2016-03-01 DIAGNOSIS — M47894 Other spondylosis, thoracic region: Secondary | ICD-10-CM | POA: Insufficient documentation

## 2016-03-01 DIAGNOSIS — I700 Atherosclerosis of aorta: Secondary | ICD-10-CM | POA: Insufficient documentation

## 2016-03-01 DIAGNOSIS — I2510 Atherosclerotic heart disease of native coronary artery without angina pectoris: Secondary | ICD-10-CM | POA: Insufficient documentation

## 2016-03-01 DIAGNOSIS — M4806 Spinal stenosis, lumbar region: Secondary | ICD-10-CM | POA: Insufficient documentation

## 2016-03-01 DIAGNOSIS — M47896 Other spondylosis, lumbar region: Secondary | ICD-10-CM | POA: Diagnosis not present

## 2016-03-01 DIAGNOSIS — T84226A Displacement of internal fixation device of vertebrae, initial encounter: Secondary | ICD-10-CM | POA: Diagnosis not present

## 2016-03-01 DIAGNOSIS — M4316 Spondylolisthesis, lumbar region: Secondary | ICD-10-CM | POA: Insufficient documentation

## 2016-03-01 DIAGNOSIS — Z981 Arthrodesis status: Secondary | ICD-10-CM | POA: Diagnosis not present

## 2016-03-01 DIAGNOSIS — M48061 Spinal stenosis, lumbar region without neurogenic claudication: Secondary | ICD-10-CM

## 2016-03-01 DIAGNOSIS — I723 Aneurysm of iliac artery: Secondary | ICD-10-CM | POA: Diagnosis not present

## 2016-03-01 DIAGNOSIS — M5416 Radiculopathy, lumbar region: Secondary | ICD-10-CM | POA: Insufficient documentation

## 2016-03-01 DIAGNOSIS — M4126 Other idiopathic scoliosis, lumbar region: Secondary | ICD-10-CM | POA: Diagnosis not present

## 2016-03-01 DIAGNOSIS — M47895 Other spondylosis, thoracolumbar region: Secondary | ICD-10-CM | POA: Diagnosis not present

## 2016-03-01 MED ORDER — DIAZEPAM 5 MG PO TABS
ORAL_TABLET | ORAL | Status: AC
  Administered 2016-03-01: 10 mg via ORAL
  Filled 2016-03-01: qty 2

## 2016-03-01 MED ORDER — IOHEXOL 300 MG/ML  SOLN
10.0000 mL | Freq: Once | INTRAMUSCULAR | Status: AC | PRN
  Administered 2016-03-01: 10 mL via INTRATHECAL

## 2016-03-01 MED ORDER — LIDOCAINE HCL (PF) 1 % IJ SOLN
INTRAMUSCULAR | Status: AC
  Administered 2016-03-01: 5 mL via INTRAMUSCULAR
  Filled 2016-03-01: qty 5

## 2016-03-01 MED ORDER — ONDANSETRON HCL 4 MG/2ML IJ SOLN: 4.0000 mg | Freq: Four times a day (QID) | INTRAMUSCULAR | Status: DC | PRN

## 2016-03-01 MED ORDER — DIAZEPAM 5 MG PO TABS
10.0000 mg | ORAL_TABLET | Freq: Once | ORAL | Status: AC
Start: 2016-03-01 — End: 2016-03-01
  Administered 2016-03-01: 10 mg via ORAL

## 2016-03-01 NOTE — Discharge Instructions (Signed)
Myelogram and Lumbar Puncture Discharge Instructions ° °1. Go home and rest quietly for the next 24 hours.  It is important to lie flat for the next 24 hours.  Get up only to go to the restroom.  You may lie in the bed or on a couch on your back, your stomach, your left side or your right side.  You may have one pillow under your head.  You may have pillows between your knees while you are on your side or under your knees while you are on your back. ° °2. DO NOT drive today.  Recline the seat as far back as it will go, while still wearing your seat belt, on the way home. ° °3. You may get up to go to the bathroom as needed.  You may sit up for 10 minutes to eat.  You may resume your normal diet and medications unless otherwise indicated. ° °4. The incidence of headache, nausea, or vomiting is about 5% (one in 20 patients).  If you develop a headache, lie flat and drink plenty of fluids until the headache goes away.  Caffeinated beverages may be helpful.  If you develop severe nausea and vomiting or a headache that does not go away with flat bed rest, call Dr Stern ° °5. You may resume normal activities after your 24 hours of bed rest is over; however, do not exert yourself strongly or do any heavy lifting tomorrow. ° °6. Call your physician for a follow-up appointment.  The results of your myelogram will be sent directly to your physician by the following day. ° °7. If you have any questions or if complications develop after you arrive home, please call Dr Stern ° °Discharge instructions have been explained to the patient.  The patient, or the person responsible for the patient, fully understands these instructions. ° ° °

## 2016-03-01 NOTE — Procedures (Signed)
Thoracic and Lumbar myelogram performed with LP at L 34. 6 cc Omnipaque 300 instilled without difficulty or apparent complication.

## 2016-11-18 ENCOUNTER — Other Ambulatory Visit: Payer: Self-pay | Admitting: Anesthesiology

## 2016-11-18 DIAGNOSIS — M5416 Radiculopathy, lumbar region: Secondary | ICD-10-CM

## 2016-12-09 ENCOUNTER — Ambulatory Visit
Admission: RE | Admit: 2016-12-09 | Discharge: 2016-12-09 | Disposition: A | Discharge status: Home / Self Care | Payer: Medicare Other | Source: Ambulatory Visit | Attending: Anesthesiology | Admitting: Anesthesiology

## 2016-12-09 VITALS — BP 128/62 | HR 62

## 2016-12-09 DIAGNOSIS — M5136 Other intervertebral disc degeneration, lumbar region: Secondary | ICD-10-CM

## 2016-12-09 DIAGNOSIS — M5416 Radiculopathy, lumbar region: Secondary | ICD-10-CM

## 2016-12-09 MED ORDER — DIAZEPAM 5 MG PO TABS
5.0000 mg | ORAL_TABLET | Freq: Once | ORAL | Status: AC
  Administered 2016-12-09: 5 mg via ORAL

## 2016-12-09 MED ORDER — IOPAMIDOL (ISOVUE-M 200) INJECTION 41%
15.0000 mL | Freq: Once | INTRAMUSCULAR | Status: AC
  Administered 2016-12-09: 15 mL via INTRATHECAL

## 2016-12-09 NOTE — Discharge Instructions (Signed)

## 2019-01-16 ENCOUNTER — Other Ambulatory Visit: Payer: Self-pay | Admitting: Neurosurgery

## 2019-01-23 ENCOUNTER — Telehealth: Payer: Self-pay | Admitting: Nurse Practitioner

## 2019-01-23 ENCOUNTER — Other Ambulatory Visit: Payer: Self-pay | Admitting: Neurosurgery

## 2019-01-23 DIAGNOSIS — M5416 Radiculopathy, lumbar region: Secondary | ICD-10-CM

## 2019-01-23 NOTE — Telephone Encounter (Signed)
Phone call to patient to verify medication list and allergies for myelogram procedure. Pt aware he will not need to hold any medications for this procedure.  

## 2019-01-31 ENCOUNTER — Ambulatory Visit
Admission: RE | Admit: 2019-01-31 | Discharge: 2019-01-31 | Disposition: A | Discharge status: Home / Self Care | Payer: Medicare Other | Source: Ambulatory Visit | Attending: Neurosurgery | Admitting: Neurosurgery

## 2019-01-31 DIAGNOSIS — M5416 Radiculopathy, lumbar region: Secondary | ICD-10-CM

## 2019-01-31 MED ORDER — DIAZEPAM 5 MG PO TABS
5.0000 mg | ORAL_TABLET | Freq: Once | ORAL | Status: AC
  Administered 2019-01-31: 5 mg via ORAL

## 2019-01-31 MED ORDER — IOPAMIDOL (ISOVUE-M 200) INJECTION 41%
15.0000 mL | Freq: Once | INTRAMUSCULAR | Status: AC
  Administered 2019-01-31: 15 mL via INTRATHECAL

## 2019-01-31 NOTE — Discharge Instructions (Signed)

## 2020-10-01 DIAGNOSIS — Z981 Arthrodesis status: Secondary | ICD-10-CM | POA: Diagnosis not present

## 2020-10-01 DIAGNOSIS — M961 Postlaminectomy syndrome, not elsewhere classified: Secondary | ICD-10-CM | POA: Diagnosis not present

## 2020-12-31 DIAGNOSIS — M961 Postlaminectomy syndrome, not elsewhere classified: Secondary | ICD-10-CM | POA: Diagnosis not present

## 2020-12-31 DIAGNOSIS — Z981 Arthrodesis status: Secondary | ICD-10-CM | POA: Diagnosis not present

## 2021-03-18 DIAGNOSIS — M961 Postlaminectomy syndrome, not elsewhere classified: Secondary | ICD-10-CM | POA: Diagnosis not present

## 2021-03-18 DIAGNOSIS — Z981 Arthrodesis status: Secondary | ICD-10-CM | POA: Diagnosis not present

## 2021-04-28 ENCOUNTER — Other Ambulatory Visit: Payer: Self-pay | Admitting: Neurosurgery

## 2021-04-28 DIAGNOSIS — Z981 Arthrodesis status: Secondary | ICD-10-CM

## 2021-04-29 ENCOUNTER — Telehealth: Payer: Self-pay

## 2021-04-30 NOTE — Progress Notes (Signed)
Phone call to patient to verify medication list and allergies for myelogram procedure. Pt reports he is no longer taking the Tramadol therefore the patient has no medications he should hold for this procedure. Pt also instructed to have a driver the day of the procedure, the procedure would take around 2 hours, and discharge instructions discussed. Pt verbalized understanding.

## 2021-05-10 ENCOUNTER — Ambulatory Visit
Admission: RE | Admit: 2021-05-10 | Discharge: 2021-05-10 | Disposition: A | Discharge status: Home / Self Care | Payer: Medicare PPO | Source: Ambulatory Visit | Attending: Neurosurgery | Admitting: Neurosurgery

## 2021-05-10 ENCOUNTER — Other Ambulatory Visit: Payer: Self-pay

## 2021-05-10 DIAGNOSIS — M4326 Fusion of spine, lumbar region: Secondary | ICD-10-CM | POA: Diagnosis not present

## 2021-05-10 DIAGNOSIS — M5126 Other intervertebral disc displacement, lumbar region: Secondary | ICD-10-CM | POA: Diagnosis not present

## 2021-05-10 DIAGNOSIS — Z981 Arthrodesis status: Secondary | ICD-10-CM

## 2021-05-10 MED ORDER — DIAZEPAM 5 MG PO TABS
5.0000 mg | ORAL_TABLET | Freq: Once | ORAL | Status: AC
  Administered 2021-05-10: 5 mg via ORAL

## 2021-05-10 MED ORDER — IOPAMIDOL (ISOVUE-M 200) INJECTION 41%
1.0000 mL | Freq: Once | INTRAMUSCULAR | Status: AC
  Administered 2021-05-10: 18 mL via INTRATHECAL

## 2021-05-10 NOTE — Discharge Instructions (Signed)
Myelogram Discharge Instructions  1. Go home and rest quietly as needed. You may resume normal activities; however, do not exert yourself strongly or do any heavy lifting today and tomorrow.   2. DO NOT drive today.    3. You may resume your normal diet and medications unless otherwise indicated. Drink lots of extra fluids today and tomorrow.   4. The incidence of headache, nausea, or vomiting is about 5% (one in 20 patients).  If you develop a headache, lie flat for 24 hours and drink plenty of fluids until the headache goes away.  Caffeinated beverages may be helpful. If when you get up you still have a headache when standing, go back to bed and force fluids for another 24 hours.   5. If you develop severe nausea and vomiting or a headache that does not go away with the flat bedrest after 48 hours, please call [phone].   6. Call your physician for a follow-up appointment.  The results of your myelogram will be sent directly to your physician by the following day.  7. If you have any questions or if complications develop after you arrive home, please call [phone].  Discharge instructions have been explained to the patient.  The patient, or the person responsible for the patient, fully understands these instructions.   Thank you for visiting our office today.  

## 2021-05-14 DIAGNOSIS — M5134 Other intervertebral disc degeneration, thoracic region: Secondary | ICD-10-CM | POA: Diagnosis not present

## 2021-05-14 DIAGNOSIS — S32009K Unspecified fracture of unspecified lumbar vertebra, subsequent encounter for fracture with nonunion: Secondary | ICD-10-CM | POA: Diagnosis not present

## 2021-05-14 DIAGNOSIS — Z981 Arthrodesis status: Secondary | ICD-10-CM | POA: Diagnosis not present

## 2021-05-14 DIAGNOSIS — M419 Scoliosis, unspecified: Secondary | ICD-10-CM | POA: Diagnosis not present

## 2021-05-20 DIAGNOSIS — S32009K Unspecified fracture of unspecified lumbar vertebra, subsequent encounter for fracture with nonunion: Secondary | ICD-10-CM | POA: Diagnosis not present

## 2021-05-20 DIAGNOSIS — M419 Scoliosis, unspecified: Secondary | ICD-10-CM | POA: Diagnosis not present

## 2021-05-20 DIAGNOSIS — M5416 Radiculopathy, lumbar region: Secondary | ICD-10-CM | POA: Diagnosis not present

## 2021-05-20 DIAGNOSIS — M545 Low back pain, unspecified: Secondary | ICD-10-CM | POA: Diagnosis not present

## 2021-05-25 ENCOUNTER — Other Ambulatory Visit: Payer: Self-pay | Admitting: Neurosurgery

## 2021-05-25 DIAGNOSIS — M419 Scoliosis, unspecified: Secondary | ICD-10-CM

## 2021-05-31 DIAGNOSIS — S32009K Unspecified fracture of unspecified lumbar vertebra, subsequent encounter for fracture with nonunion: Secondary | ICD-10-CM | POA: Diagnosis not present

## 2021-05-31 DIAGNOSIS — M545 Low back pain, unspecified: Secondary | ICD-10-CM | POA: Diagnosis not present

## 2021-05-31 DIAGNOSIS — I10 Essential (primary) hypertension: Secondary | ICD-10-CM | POA: Diagnosis not present

## 2021-05-31 DIAGNOSIS — M5416 Radiculopathy, lumbar region: Secondary | ICD-10-CM | POA: Diagnosis not present

## 2021-06-03 DIAGNOSIS — M5416 Radiculopathy, lumbar region: Secondary | ICD-10-CM | POA: Diagnosis not present

## 2021-06-10 ENCOUNTER — Ambulatory Visit
Admission: RE | Admit: 2021-06-10 | Discharge: 2021-06-10 | Disposition: A | Discharge status: Home / Self Care | Payer: Medicare PPO | Source: Ambulatory Visit | Attending: Neurosurgery | Admitting: Neurosurgery

## 2021-06-10 ENCOUNTER — Other Ambulatory Visit: Payer: Self-pay

## 2021-06-10 DIAGNOSIS — M47814 Spondylosis without myelopathy or radiculopathy, thoracic region: Secondary | ICD-10-CM | POA: Diagnosis not present

## 2021-06-10 DIAGNOSIS — M4804 Spinal stenosis, thoracic region: Secondary | ICD-10-CM | POA: Diagnosis not present

## 2021-06-10 DIAGNOSIS — Z981 Arthrodesis status: Secondary | ICD-10-CM | POA: Diagnosis not present

## 2021-06-10 DIAGNOSIS — M419 Scoliosis, unspecified: Secondary | ICD-10-CM

## 2021-06-10 DIAGNOSIS — G8929 Other chronic pain: Secondary | ICD-10-CM | POA: Diagnosis not present

## 2021-06-21 DIAGNOSIS — M545 Low back pain, unspecified: Secondary | ICD-10-CM | POA: Diagnosis not present

## 2021-06-21 DIAGNOSIS — S32009K Unspecified fracture of unspecified lumbar vertebra, subsequent encounter for fracture with nonunion: Secondary | ICD-10-CM | POA: Diagnosis not present

## 2021-06-21 DIAGNOSIS — M419 Scoliosis, unspecified: Secondary | ICD-10-CM | POA: Diagnosis not present

## 2021-06-21 DIAGNOSIS — M5416 Radiculopathy, lumbar region: Secondary | ICD-10-CM | POA: Diagnosis not present

## 2021-06-28 ENCOUNTER — Other Ambulatory Visit: Payer: Self-pay | Admitting: Neurosurgery

## 2021-07-20 DIAGNOSIS — Z1389 Encounter for screening for other disorder: Secondary | ICD-10-CM | POA: Diagnosis not present

## 2021-07-20 DIAGNOSIS — I10 Essential (primary) hypertension: Secondary | ICD-10-CM | POA: Diagnosis not present

## 2021-07-20 DIAGNOSIS — Z125 Encounter for screening for malignant neoplasm of prostate: Secondary | ICD-10-CM | POA: Diagnosis not present

## 2021-07-20 DIAGNOSIS — E782 Mixed hyperlipidemia: Secondary | ICD-10-CM | POA: Diagnosis not present

## 2021-07-20 DIAGNOSIS — R7303 Prediabetes: Secondary | ICD-10-CM | POA: Diagnosis not present

## 2021-07-20 DIAGNOSIS — M545 Low back pain, unspecified: Secondary | ICD-10-CM | POA: Diagnosis not present

## 2021-07-20 DIAGNOSIS — Z0000 Encounter for general adult medical examination without abnormal findings: Secondary | ICD-10-CM | POA: Diagnosis not present

## 2021-07-22 DIAGNOSIS — M960 Pseudarthrosis after fusion or arthrodesis: Secondary | ICD-10-CM | POA: Diagnosis not present

## 2021-08-19 NOTE — Pre-Procedure Instructions (Addendum)
Surgical Instructions    Your procedure is scheduled on Tuesday 08/24/21.   Report to [facility] Main Entrance "A" at 06:15 A.M., then check in with the Admitting office.  Call this number if you have problems the morning of surgery:  [phone]   If you have any questions prior to your surgery date call [phone]: Open Monday-Friday 8am-4pm    Remember:  Do not eat after midnight the night before your surgery  You may drink clear liquids until 05:15 A.M. the morning of your surgery.   Clear liquids allowed are: Water, Non-Citrus Juices (without pulp), Carbonated Beverages, Clear Tea, Black Coffee ONLY (NO MILK, CREAM OR POWDERED CREAMER of any kind), and Gatorade    Take these medicines the morning of surgery with A SIP OF WATER   oxyCODONE (OXYCONTIN)     Take these medicines if needed:   oxyCODONE (OXY IR/ROXICODONE)    As of today, STOP taking any Aspirin (unless otherwise instructed by your surgeon) Aleve, Naproxen, Ibuprofen, Motrin, Advil, Goody's, BC's, all herbal medications, fish oil, and all vitamins.          Do not wear jewelry or makeup Do not wear lotions, powders, perfumes/colognes, or deodorant. Do not shave 48 hours prior to surgery.  Men may shave face and neck. Do not bring valuables to the hospital. DO Not wear nail polish, gel polish, artificial nails, or any other type of covering on natural nails including finger and toenails. If patients have artificial nails, gel coating, etc. that need to be removed by a nail salon please have this removed prior to surgery or surgery may need to be canceled/delayed if the surgeon/ anesthesia feels like the patient is unable to be adequately monitored.             [facility] is not responsible for any belongings or valuables.  Do NOT Smoke (Tobacco/Vaping)  24 hours prior to your procedure If you use a CPAP at night, you may bring your mask for your overnight stay.   Contacts, glasses, dentures or bridgework may  not be worn into surgery, please bring cases for these belongings   For patients admitted to the hospital, discharge time will be determined by your treatment team.   Patients discharged the day of surgery will not be allowed to drive home, and someone needs to stay with them for 24 hours.  NO VISITORS WILL BE ALLOWED IN PRE-OP WHERE PATIENTS GET READY FOR SURGERY.  ONLY 1 SUPPORT PERSON MAY BE PRESENT WHILE YOU ARE IN SURGERY.  IF YOU ARE TO BE ADMITTED, ONCE YOU ARE IN YOUR ROOM YOU WILL BE ALLOWED TWO (2) VISITORS.  Minor children may have two parents present. Special consideration for safety and communication needs will be reviewed on a case by case basis.  Special instructions:    Oral Hygiene is also important to reduce your risk of infection.  Remember - BRUSH YOUR TEETH THE MORNING OF SURGERY WITH YOUR REGULAR TOOTHPASTE   [facility]- Preparing For Surgery  Before surgery, you can play an important role. Because skin is not sterile, your skin needs to be as free of germs as possible. You can reduce the number of germs on your skin by washing with CHG (chlorahexidine gluconate) Soap before surgery.  CHG is an antiseptic cleaner which kills germs and bonds with the skin to continue killing germs even after washing.     Please do not use if you have an allergy to CHG or antibacterial soaps.  If your skin becomes reddened/irritated stop using the CHG.  Do not shave (including legs and underarms) for at least 48 hours prior to first CHG shower. It is OK to shave your face.  Please follow these instructions carefully.     Shower the NIGHT BEFORE SURGERY and the MORNING OF SURGERY with CHG Soap.   If you chose to wash your hair, wash your hair first as usual with your normal shampoo. After you shampoo, rinse your hair and body thoroughly to remove the shampoo.  Then [employer] and genitals (private parts) with your normal soap and rinse thoroughly to remove soap.  After that Use CHG Soap  as you would any other liquid soap. You can apply CHG directly to the skin and wash gently with a scrungie or a clean washcloth.   Apply the CHG Soap to your body ONLY FROM THE NECK DOWN.  Do not use on open wounds or open sores. Avoid contact with your eyes, ears, mouth and genitals (private parts). Wash Face and genitals (private parts)  with your normal soap.   Wash thoroughly, paying special attention to the area where your surgery will be performed.  Thoroughly rinse your body with warm water from the neck down.  DO NOT shower/wash with your normal soap after using and rinsing off the CHG Soap.  Pat yourself dry with a CLEAN TOWEL.  Wear CLEAN PAJAMAS to bed the night before surgery  Place CLEAN SHEETS on your bed the night before your surgery  DO NOT SLEEP WITH PETS.   Day of Surgery:  Take a shower with CHG soap. Wear Clean/Comfortable clothing the morning of surgery Do not apply any deodorants/lotions.   Remember to brush your teeth WITH YOUR REGULAR TOOTHPASTE.   Please read over the following fact sheets that you were given.

## 2021-08-20 ENCOUNTER — Encounter (HOSPITAL_COMMUNITY)
Admission: RE | Admit: 2021-08-20 | Discharge: 2021-08-20 | Disposition: A | Discharge status: Home / Self Care | Payer: Medicare PPO | Source: Ambulatory Visit | Attending: Neurosurgery | Admitting: Neurosurgery

## 2021-08-20 ENCOUNTER — Encounter (HOSPITAL_COMMUNITY): Payer: Self-pay

## 2021-08-20 ENCOUNTER — Other Ambulatory Visit: Payer: Self-pay

## 2021-08-20 DIAGNOSIS — Z20822 Contact with and (suspected) exposure to covid-19: Secondary | ICD-10-CM | POA: Insufficient documentation

## 2021-08-20 DIAGNOSIS — Z01818 Encounter for other preprocedural examination: Secondary | ICD-10-CM | POA: Insufficient documentation

## 2021-08-20 LAB — BASIC METABOLIC PANEL
Anion gap: 8 (ref 5–15)
BUN: 22 mg/dL (ref 8–23)
CO2: 27 mmol/L (ref 22–32)
Calcium: 9 mg/dL (ref 8.9–10.3)
Chloride: 102 mmol/L (ref 98–111)
Creatinine, Ser: 0.95 mg/dL (ref 0.61–1.24)
GFR, Estimated: >60 mL/min (ref >60)
Glucose, Bld: 132 mg/dL — ABNORMAL HIGH (ref 70–99)
Potassium: 4.5 mmol/L (ref 3.5–5.1)
Sodium: 137 mmol/L (ref 135–145)

## 2021-08-20 LAB — CBC
HCT: 40.9 % (ref 39.0–52.0)
Hemoglobin: 13.4 g/dL (ref 13.0–17.0)
MCH: 30.2 pg (ref 26.0–34.0)
MCHC: 32.8 g/dL (ref 30.0–36.0)
MCV: 92.3 fL (ref 80.0–100.0)
Platelets: 188 10*3/uL (ref 150–400)
RBC: 4.43 MIL/uL (ref 4.22–5.81)
RDW: 12.5 % (ref 11.5–15.5)
WBC: 6.3 10*3/uL (ref 4.0–10.5)
nRBC: 0 % (ref 0.0–0.2)

## 2021-08-20 LAB — TYPE AND SCREEN
ABO/RH(D): A POS
Antibody Screen: NEGATIVE

## 2021-08-20 LAB — SURGICAL PCR SCREEN
MRSA, PCR: NEGATIVE
Staphylococcus aureus: POSITIVE — AB

## 2021-08-20 LAB — SARS CORONAVIRUS 2 (TAT 6-24 HRS): SARS Coronavirus 2: NEGATIVE

## 2021-08-20 NOTE — Progress Notes (Signed)
PCP - Dr. Tally Joe Cardiologist - denies  PPM/ICD - n/a Device Orders - n/a Rep Notified - n/a  Chest x-ray - n/a EKG - 08/20/21 Stress Test - denies ECHO - denies Cardiac Cath - denies  Sleep Study - denies CPAP - n/a  Fasting Blood Sugar - n/a Checks Blood Sugar ___  n/a __ times a day  Blood Thinner Instructions: n/a Aspirin Instructions: n/a  ERAS Protcol - Yes PRE-SURGERY Ensure or G2- n/a  COVID TEST- 08/20/21 at PAT appointment. Pending.    Anesthesia review: No  Patient denies shortness of breath, fever, cough and chest pain at PAT appointment   All instructions explained to the patient, with a verbal understanding of the material. Patient agrees to go over the instructions while at home for a better understanding. Patient also instructed to self quarantine after being tested for COVID-19. The opportunity to ask questions was provided.

## 2021-08-23 NOTE — Anesthesia Preprocedure Evaluation (Addendum)
Anesthesia Evaluation  Patient identified by MRN, date of birth, ID band Patient awake    Reviewed: Allergy & Precautions, H&P , NPO status , Patient's Chart, lab work & pertinent test results  History of Anesthesia Complications Negative for: history of anesthetic complications  Airway Mallampati: II  TM Distance: >3 FB Neck ROM: Full    Dental no notable dental hx. (+) Dental Advisory Given   Pulmonary neg pulmonary ROS,    Pulmonary exam normal breath sounds clear to auscultation (-) decreased breath sounds      Cardiovascular hypertension,  Rhythm:Irregular Rate:Normal     Neuro/Psych negative neurological ROS     GI/Hepatic negative GI ROS, Neg liver ROS,   Endo/Other  negative endocrine ROS  Renal/GU negative Renal ROS     Musculoskeletal  (+) Arthritis ,   Abdominal (+) + obese,   Peds  Hematology   Anesthesia Other Findings   Reproductive/Obstetrics                            Anesthesia Physical  Anesthesia Plan  ASA: 3  Anesthesia Plan: General   Post-op Pain Management:    Induction: Intravenous  PONV Risk Score and Plan: 3 and Ondansetron, Dexamethasone, Treatment may vary due to age or medical condition, Diphenhydramine and Midazolam  Airway Management Planned: Oral ETT  Additional Equipment: None  Intra-op Plan:   Post-operative Plan: Extubation in OR  Informed Consent: I have reviewed the patients History and Physical, chart, labs and discussed the procedure including the risks, benefits and alternatives for the proposed anesthesia with the patient or authorized representative who has indicated his/her understanding and acceptance.     Dental advisory given  Plan Discussed with: CRNA  Anesthesia Plan Comments:        Anesthesia Quick Evaluation

## 2021-08-24 ENCOUNTER — Encounter (HOSPITAL_COMMUNITY)
Admission: RE | Disposition: A | Discharge status: Home / Self Care | Payer: Self-pay | Source: Home / Self Care | Attending: Neurosurgery

## 2021-08-24 ENCOUNTER — Inpatient Hospital Stay (HOSPITAL_COMMUNITY): Payer: Medicare PPO | Admitting: Anesthesiology

## 2021-08-24 ENCOUNTER — Inpatient Hospital Stay (HOSPITAL_COMMUNITY): Payer: Medicare PPO

## 2021-08-24 ENCOUNTER — Encounter (HOSPITAL_COMMUNITY): Payer: Self-pay | Admitting: Neurosurgery

## 2021-08-24 ENCOUNTER — Inpatient Hospital Stay (HOSPITAL_COMMUNITY)
Admission: RE | Admit: 2021-08-24 | Discharge: 2021-08-25 | DRG: 460 | Disposition: A | Discharge status: Home / Self Care | Payer: Medicare PPO | Attending: Neurosurgery | Admitting: Neurosurgery

## 2021-08-24 DIAGNOSIS — M960 Pseudarthrosis after fusion or arthrodesis: Secondary | ICD-10-CM | POA: Diagnosis not present

## 2021-08-24 DIAGNOSIS — M419 Scoliosis, unspecified: Secondary | ICD-10-CM | POA: Diagnosis present

## 2021-08-24 DIAGNOSIS — M4807 Spinal stenosis, lumbosacral region: Secondary | ICD-10-CM | POA: Diagnosis not present

## 2021-08-24 DIAGNOSIS — I10 Essential (primary) hypertension: Secondary | ICD-10-CM | POA: Diagnosis present

## 2021-08-24 DIAGNOSIS — S32009K Unspecified fracture of unspecified lumbar vertebra, subsequent encounter for fracture with nonunion: Secondary | ICD-10-CM | POA: Diagnosis present

## 2021-08-24 DIAGNOSIS — Z419 Encounter for procedure for purposes other than remedying health state, unspecified: Secondary | ICD-10-CM

## 2021-08-24 DIAGNOSIS — M48061 Spinal stenosis, lumbar region without neurogenic claudication: Secondary | ICD-10-CM | POA: Diagnosis not present

## 2021-08-24 DIAGNOSIS — M5416 Radiculopathy, lumbar region: Secondary | ICD-10-CM | POA: Diagnosis present

## 2021-08-24 DIAGNOSIS — Y838 Other surgical procedures as the cause of abnormal reaction of the patient, or of later complication, without mention of misadventure at the time of the procedure: Secondary | ICD-10-CM | POA: Diagnosis present

## 2021-08-24 DIAGNOSIS — M4727 Other spondylosis with radiculopathy, lumbosacral region: Secondary | ICD-10-CM | POA: Diagnosis not present

## 2021-08-24 DIAGNOSIS — Z981 Arthrodesis status: Secondary | ICD-10-CM | POA: Diagnosis not present

## 2021-08-24 DIAGNOSIS — M5117 Intervertebral disc disorders with radiculopathy, lumbosacral region: Secondary | ICD-10-CM | POA: Diagnosis not present

## 2021-08-24 DIAGNOSIS — M4326 Fusion of spine, lumbar region: Secondary | ICD-10-CM | POA: Diagnosis not present

## 2021-08-24 HISTORY — PX: APPLICATION OF INTRAOPERATIVE CT SCAN: SHX6668

## 2021-08-24 SURGERY — POSTERIOR LUMBAR FUSION 1 LEVEL
Anesthesia: General | Site: Spine Lumbar

## 2021-08-24 MED ORDER — PHENYLEPHRINE HCL (PRESSORS) 10 MG/ML IV SOLN
INTRAVENOUS | Status: DC | PRN
  Administered 2021-08-24: 40 ug via INTRAVENOUS
  Administered 2021-08-24: 80 ug via INTRAVENOUS

## 2021-08-24 MED ORDER — CHLORHEXIDINE GLUCONATE CLOTH 2 % EX PADS: 6.0000 | MEDICATED_PAD | Freq: Once | CUTANEOUS | Status: DC

## 2021-08-24 MED ORDER — KETOROLAC TROMETHAMINE 15 MG/ML IJ SOLN
7.5000 mg | Freq: Four times a day (QID) | INTRAMUSCULAR | Status: DC
  Administered 2021-08-24 – 2021-08-25 (×3): 7.5 mg via INTRAVENOUS
  Filled 2021-08-24 (×3): qty 1

## 2021-08-24 MED ORDER — DOCUSATE SODIUM 100 MG PO CAPS
100.0000 mg | ORAL_CAPSULE | Freq: Two times a day (BID) | ORAL | Status: DC
  Administered 2021-08-24 – 2021-08-25 (×2): 100 mg via ORAL
  Filled 2021-08-24 (×2): qty 1

## 2021-08-24 MED ORDER — PROMETHAZINE HCL 25 MG/ML IJ SOLN: 6.2500 mg | INTRAMUSCULAR | Status: DC | PRN

## 2021-08-24 MED ORDER — BUPIVACAINE LIPOSOME 1.3 % IJ SUSP
INTRAMUSCULAR | Status: AC
  Filled 2021-08-24: qty 20

## 2021-08-24 MED ORDER — BUPIVACAINE HCL (PF) 0.5 % IJ SOLN
INTRAMUSCULAR | Status: DC | PRN
  Administered 2021-08-24: 8 mL

## 2021-08-24 MED ORDER — FENTANYL CITRATE (PF) 250 MCG/5ML IJ SOLN
INTRAMUSCULAR | Status: AC
  Filled 2021-08-24: qty 5

## 2021-08-24 MED ORDER — ACETAMINOPHEN 500 MG PO TABS
1000.0000 mg | ORAL_TABLET | Freq: Once | ORAL | Status: AC
  Administered 2021-08-24: 1000 mg via ORAL

## 2021-08-24 MED ORDER — LACTATED RINGERS IV SOLN: INTRAVENOUS | Status: DC

## 2021-08-24 MED ORDER — PROPOFOL 10 MG/ML IV BOLUS
INTRAVENOUS | Status: DC | PRN
  Administered 2021-08-24: 110 mg via INTRAVENOUS

## 2021-08-24 MED ORDER — HYDROMORPHONE HCL 1 MG/ML IJ SOLN: 0.2500 mg | INTRAMUSCULAR | Status: DC | PRN

## 2021-08-24 MED ORDER — ACETAMINOPHEN 500 MG PO TABS
ORAL_TABLET | ORAL | Status: AC
  Filled 2021-08-24: qty 2

## 2021-08-24 MED ORDER — ORAL CARE MOUTH RINSE: 15.0000 mL | Freq: Once | OROMUCOSAL | Status: AC

## 2021-08-24 MED ORDER — GABAPENTIN 300 MG PO CAPS
ORAL_CAPSULE | ORAL | Status: AC
  Filled 2021-08-24: qty 1

## 2021-08-24 MED ORDER — GABAPENTIN 300 MG PO CAPS
300.0000 mg | ORAL_CAPSULE | Freq: Once | ORAL | Status: AC
  Administered 2021-08-24: 300 mg via ORAL

## 2021-08-24 MED ORDER — ONDANSETRON HCL 4 MG/2ML IJ SOLN
INTRAMUSCULAR | Status: DC | PRN
  Administered 2021-08-24: 4 mg via INTRAVENOUS

## 2021-08-24 MED ORDER — HYDROCODONE-ACETAMINOPHEN 5-325 MG PO TABS: 1.0000 | ORAL_TABLET | ORAL | Status: DC | PRN

## 2021-08-24 MED ORDER — ONDANSETRON HCL 4 MG/2ML IJ SOLN: 4.0000 mg | Freq: Four times a day (QID) | INTRAMUSCULAR | Status: DC | PRN

## 2021-08-24 MED ORDER — 0.9 % SODIUM CHLORIDE (POUR BTL) OPTIME
TOPICAL | Status: DC | PRN
  Administered 2021-08-24: 1000 mL

## 2021-08-24 MED ORDER — SUGAMMADEX SODIUM 200 MG/2ML IV SOLN
INTRAVENOUS | Status: DC | PRN
  Administered 2021-08-24: 200 mg via INTRAVENOUS

## 2021-08-24 MED ORDER — CHLORHEXIDINE GLUCONATE 0.12 % MT SOLN
15.0000 mL | Freq: Once | OROMUCOSAL | Status: AC
  Administered 2021-08-24: 15 mL via OROMUCOSAL
  Filled 2021-08-24: qty 15

## 2021-08-24 MED ORDER — OXYCODONE HCL 5 MG PO TABS
10.0000 mg | ORAL_TABLET | ORAL | Status: DC | PRN
  Administered 2021-08-24 – 2021-08-25 (×2): 10 mg via ORAL
  Filled 2021-08-24 (×3): qty 2

## 2021-08-24 MED ORDER — BUPIVACAINE LIPOSOME 1.3 % IJ SUSP
INTRAMUSCULAR | Status: DC | PRN
  Administered 2021-08-24: 20 mL

## 2021-08-24 MED ORDER — BISACODYL 10 MG RE SUPP: 10.0000 mg | Freq: Every day | RECTAL | Status: DC | PRN

## 2021-08-24 MED ORDER — MIDAZOLAM HCL 2 MG/2ML IJ SOLN
INTRAMUSCULAR | Status: AC
  Filled 2021-08-24: qty 2

## 2021-08-24 MED ORDER — ACETAMINOPHEN 650 MG RE SUPP: 650.0000 mg | RECTAL | Status: DC | PRN

## 2021-08-24 MED ORDER — FENTANYL CITRATE (PF) 100 MCG/2ML IJ SOLN
INTRAMUSCULAR | Status: DC | PRN
  Administered 2021-08-24 (×2): 50 ug via INTRAVENOUS
  Administered 2021-08-24: 100 ug via INTRAVENOUS
  Administered 2021-08-24: 50 ug via INTRAVENOUS

## 2021-08-24 MED ORDER — LIDOCAINE-EPINEPHRINE 1 %-1:100000 IJ SOLN
INTRAMUSCULAR | Status: AC
  Filled 2021-08-24: qty 1

## 2021-08-24 MED ORDER — SODIUM CHLORIDE 0.9% FLUSH: 3.0000 mL | Freq: Two times a day (BID) | INTRAVENOUS | Status: DC

## 2021-08-24 MED ORDER — METHOCARBAMOL 500 MG PO TABS
500.0000 mg | ORAL_TABLET | Freq: Four times a day (QID) | ORAL | Status: DC | PRN
  Administered 2021-08-24 – 2021-08-25 (×2): 500 mg via ORAL
  Filled 2021-08-24 (×2): qty 1

## 2021-08-24 MED ORDER — ONDANSETRON HCL 4 MG PO TABS: 4.0000 mg | ORAL_TABLET | Freq: Four times a day (QID) | ORAL | Status: DC | PRN

## 2021-08-24 MED ORDER — ALBUMIN HUMAN 5 % IV SOLN: INTRAVENOUS | Status: DC | PRN

## 2021-08-24 MED ORDER — PROPOFOL 10 MG/ML IV BOLUS
INTRAVENOUS | Status: AC
  Filled 2021-08-24: qty 20

## 2021-08-24 MED ORDER — SODIUM CHLORIDE 0.9% FLUSH: 3.0000 mL | INTRAVENOUS | Status: DC | PRN

## 2021-08-24 MED ORDER — THROMBIN 5000 UNITS EX SOLR
OROMUCOSAL | Status: DC | PRN
  Administered 2021-08-24: 5 mL

## 2021-08-24 MED ORDER — HYDROMORPHONE HCL 1 MG/ML IJ SOLN
0.5000 mg | INTRAMUSCULAR | Status: DC | PRN
  Administered 2021-08-24: 0.5 mg via INTRAVENOUS
  Filled 2021-08-24: qty 0.5

## 2021-08-24 MED ORDER — DEXAMETHASONE SODIUM PHOSPHATE 10 MG/ML IJ SOLN
INTRAMUSCULAR | Status: DC | PRN
  Administered 2021-08-24: 10 mg via INTRAVENOUS

## 2021-08-24 MED ORDER — OXYCODONE HCL ER 10 MG PO T12A
10.0000 mg | EXTENDED_RELEASE_TABLET | Freq: Two times a day (BID) | ORAL | Status: DC
  Administered 2021-08-24 – 2021-08-25 (×2): 10 mg via ORAL
  Filled 2021-08-24 (×2): qty 1

## 2021-08-24 MED ORDER — AMLODIPINE BESYLATE 5 MG PO TABS: 5.0000 mg | ORAL_TABLET | Freq: Every day | ORAL | Status: DC

## 2021-08-24 MED ORDER — ALUM & MAG HYDROXIDE-SIMETH 200-200-20 MG/5ML PO SUSP: 30.0000 mL | Freq: Four times a day (QID) | ORAL | Status: DC | PRN

## 2021-08-24 MED ORDER — BENAZEPRIL HCL 20 MG PO TABS
20.0000 mg | ORAL_TABLET | Freq: Every day | ORAL | Status: DC
  Filled 2021-08-24: qty 1

## 2021-08-24 MED ORDER — ROCURONIUM BROMIDE 10 MG/ML (PF) SYRINGE
PREFILLED_SYRINGE | INTRAVENOUS | Status: AC
  Filled 2021-08-24: qty 10

## 2021-08-24 MED ORDER — SODIUM CHLORIDE 0.9 % IV SOLN
250.0000 mL | INTRAVENOUS | Status: DC
  Administered 2021-08-24: 250 mL via INTRAVENOUS

## 2021-08-24 MED ORDER — KCL IN DEXTROSE-NACL 20-5-0.45 MEQ/L-%-% IV SOLN: INTRAVENOUS | Status: DC

## 2021-08-24 MED ORDER — LIDOCAINE-EPINEPHRINE 1 %-1:100000 IJ SOLN
INTRAMUSCULAR | Status: DC | PRN
  Administered 2021-08-24: 8 mL

## 2021-08-24 MED ORDER — KETAMINE HCL 50 MG/5ML IJ SOSY
PREFILLED_SYRINGE | INTRAMUSCULAR | Status: AC
  Filled 2021-08-24: qty 5

## 2021-08-24 MED ORDER — BUPIVACAINE HCL (PF) 0.5 % IJ SOLN
INTRAMUSCULAR | Status: AC
  Filled 2021-08-24: qty 30

## 2021-08-24 MED ORDER — CEFAZOLIN SODIUM-DEXTROSE 2-4 GM/100ML-% IV SOLN
2.0000 g | INTRAVENOUS | Status: AC
  Administered 2021-08-24 (×2): 2 g via INTRAVENOUS
  Filled 2021-08-24: qty 100

## 2021-08-24 MED ORDER — PHENOL 1.4 % MT LIQD: 1.0000 | OROMUCOSAL | Status: DC | PRN

## 2021-08-24 MED ORDER — PANTOPRAZOLE SODIUM 40 MG IV SOLR: 40.0000 mg | Freq: Every day | INTRAVENOUS | Status: DC

## 2021-08-24 MED ORDER — MENTHOL 3 MG MT LOZG: 1.0000 | LOZENGE | OROMUCOSAL | Status: DC | PRN

## 2021-08-24 MED ORDER — ATORVASTATIN CALCIUM 10 MG PO TABS
20.0000 mg | ORAL_TABLET | Freq: Every day | ORAL | Status: DC
  Administered 2021-08-24: 20 mg via ORAL
  Filled 2021-08-24: qty 2

## 2021-08-24 MED ORDER — PANTOPRAZOLE SODIUM 40 MG PO TBEC
40.0000 mg | DELAYED_RELEASE_TABLET | Freq: Every day | ORAL | Status: DC
  Administered 2021-08-24 – 2021-08-25 (×2): 40 mg via ORAL
  Filled 2021-08-24 (×2): qty 1

## 2021-08-24 MED ORDER — GABAPENTIN 300 MG PO CAPS
300.0000 mg | ORAL_CAPSULE | Freq: Three times a day (TID) | ORAL | Status: DC
  Administered 2021-08-24 – 2021-08-25 (×3): 300 mg via ORAL
  Filled 2021-08-24 (×3): qty 1

## 2021-08-24 MED ORDER — POLYETHYLENE GLYCOL 3350 17 G PO PACK: 17.0000 g | PACK | Freq: Every day | ORAL | Status: DC | PRN

## 2021-08-24 MED ORDER — HYDROMORPHONE HCL 1 MG/ML IJ SOLN
0.2500 mg | INTRAMUSCULAR | Status: DC | PRN
Start: 2021-08-24 — End: 2021-08-24

## 2021-08-24 MED ORDER — KETAMINE HCL 10 MG/ML IJ SOLN
INTRAMUSCULAR | Status: DC | PRN
  Administered 2021-08-24: 50 mg via INTRAVENOUS

## 2021-08-24 MED ORDER — ACETAMINOPHEN 325 MG PO TABS
650.0000 mg | ORAL_TABLET | ORAL | Status: DC | PRN
  Filled 2021-08-24: qty 2

## 2021-08-24 MED ORDER — FLEET ENEMA 7-19 GM/118ML RE ENEM: 1.0000 | ENEMA | Freq: Once | RECTAL | Status: DC | PRN

## 2021-08-24 MED ORDER — PHENYLEPHRINE HCL-NACL 20-0.9 MG/250ML-% IV SOLN
INTRAVENOUS | Status: DC | PRN
  Administered 2021-08-24: 50 ug/min via INTRAVENOUS

## 2021-08-24 MED ORDER — MEPERIDINE HCL 25 MG/ML IJ SOLN
6.2500 mg | INTRAMUSCULAR | Status: DC | PRN
Start: 2021-08-24 — End: 2021-08-24

## 2021-08-24 MED ORDER — LIDOCAINE 2% (20 MG/ML) 5 ML SYRINGE
INTRAMUSCULAR | Status: DC | PRN
  Administered 2021-08-24: 80 mg via INTRAVENOUS

## 2021-08-24 MED ORDER — METHOCARBAMOL 1000 MG/10ML IJ SOLN
500.0000 mg | Freq: Four times a day (QID) | INTRAVENOUS | Status: DC | PRN
  Filled 2021-08-24: qty 5

## 2021-08-24 MED ORDER — ZOLPIDEM TARTRATE 5 MG PO TABS: 5.0000 mg | ORAL_TABLET | Freq: Every evening | ORAL | Status: DC | PRN

## 2021-08-24 MED ORDER — TRAMADOL HCL 50 MG PO TABS: 50.0000 mg | ORAL_TABLET | Freq: Four times a day (QID) | ORAL | Status: DC | PRN

## 2021-08-24 MED ORDER — OXYCODONE HCL 5 MG PO TABS
5.0000 mg | ORAL_TABLET | Freq: Three times a day (TID) | ORAL | Status: DC | PRN
Start: 2021-08-24 — End: 2021-08-25
  Administered 2021-08-25: 5 mg via ORAL

## 2021-08-24 MED ORDER — ROCURONIUM BROMIDE 10 MG/ML (PF) SYRINGE
PREFILLED_SYRINGE | INTRAVENOUS | Status: DC | PRN
  Administered 2021-08-24: 50 mg via INTRAVENOUS
  Administered 2021-08-24 (×4): 20 mg via INTRAVENOUS

## 2021-08-24 MED ORDER — LIDOCAINE 2% (20 MG/ML) 5 ML SYRINGE
INTRAMUSCULAR | Status: AC
  Filled 2021-08-24: qty 5

## 2021-08-24 MED ORDER — CEFAZOLIN SODIUM-DEXTROSE 2-4 GM/100ML-% IV SOLN
2.0000 g | Freq: Three times a day (TID) | INTRAVENOUS | Status: AC
  Administered 2021-08-24 – 2021-08-25 (×2): 2 g via INTRAVENOUS
  Filled 2021-08-24 (×2): qty 100

## 2021-08-24 MED ORDER — MEPERIDINE HCL 25 MG/ML IJ SOLN: 6.2500 mg | INTRAMUSCULAR | Status: DC | PRN

## 2021-08-24 MED ORDER — THROMBIN 5000 UNITS EX SOLR
CUTANEOUS | Status: AC
  Filled 2021-08-24: qty 5000

## 2021-08-24 SURGICAL SUPPLY — 82 items
ADH SKN CLS APL DERMABOND .7 (GAUZE/BANDAGES/DRESSINGS) ×1
APL SKNCLS STERI-STRIP NONHPOA (GAUZE/BANDAGES/DRESSINGS) ×1
BAG COUNTER SPONGE SURGICOUNT (BAG) ×4 IMPLANT
BAG SPNG CNTER NS LX DISP (BAG) ×2
BASKET BONE COLLECTION (BASKET) ×2 IMPLANT
BENZOIN TINCTURE PRP APPL 2/3 (GAUZE/BANDAGES/DRESSINGS) ×2 IMPLANT
BLADE CLIPPER SURG (BLADE) IMPLANT
BUR MATCHSTICK NEURO 3.0 LAGG (BURR) ×2 IMPLANT
BUR PRECISION FLUTE 5.0 (BURR) ×2 IMPLANT
CANISTER SUCT 3000ML PPV (MISCELLANEOUS) ×2 IMPLANT
CARTRIDGE OIL MAESTRO DRILL (MISCELLANEOUS) ×2 IMPLANT
CNTNR URN SCR LID CUP LEK RST (MISCELLANEOUS) ×1 IMPLANT
CONNECTOR RELINE 20MM OPEN OFF (Connector) ×2 IMPLANT
CONT SPEC 4OZ STRL OR WHT (MISCELLANEOUS) ×2
COVER BACK TABLE 60X90IN (DRAPES) ×6 IMPLANT
DECANTER SPIKE VIAL GLASS SM (MISCELLANEOUS) ×2 IMPLANT
DERMABOND ADVANCED (GAUZE/BANDAGES/DRESSINGS) ×1
DERMABOND ADVANCED .7 DNX12 (GAUZE/BANDAGES/DRESSINGS) ×1 IMPLANT
DIFFUSER DRILL AIR PNEUMATIC (MISCELLANEOUS) ×4 IMPLANT
DIGITIZER BENDINI (MISCELLANEOUS) ×2 IMPLANT
DRAPE C-ARM 42X72 X-RAY (DRAPES) ×2 IMPLANT
DRAPE C-ARMOR (DRAPES) ×2 IMPLANT
DRAPE LAPAROTOMY 100X72X124 (DRAPES) ×2 IMPLANT
DRAPE SCAN PATIENT (DRAPES) ×2 IMPLANT
DRAPE SURG 17X23 STRL (DRAPES) ×2 IMPLANT
DRSG OPSITE POSTOP 4X10 (GAUZE/BANDAGES/DRESSINGS) ×2 IMPLANT
DRSG OPSITE POSTOP 4X6 (GAUZE/BANDAGES/DRESSINGS) ×2 IMPLANT
DURAPREP 26ML APPLICATOR (WOUND CARE) ×2 IMPLANT
ELECT BLADE 4.0 EZ CLEAN MEGAD (MISCELLANEOUS) ×2
ELECT REM PT RETURN 9FT ADLT (ELECTROSURGICAL) ×2
ELECTRODE BLDE 4.0 EZ CLN MEGD (MISCELLANEOUS) ×1 IMPLANT
ELECTRODE REM PT RTRN 9FT ADLT (ELECTROSURGICAL) ×1 IMPLANT
EVACUATOR 1/8 PVC DRAIN (DRAIN) IMPLANT
GAUZE 4X4 16PLY ~~LOC~~+RFID DBL (SPONGE) ×6 IMPLANT
GAUZE SPONGE 4X4 12PLY STRL (GAUZE/BANDAGES/DRESSINGS) ×2 IMPLANT
GLOVE EXAM NITRILE XL STR (GLOVE) IMPLANT
GLOVE SRG 8 PF TXTR STRL LF DI (GLOVE) ×2 IMPLANT
GLOVE SURG ENC MOIS LTX SZ8 (GLOVE) ×4 IMPLANT
GLOVE SURG LTX SZ8 (GLOVE) ×4 IMPLANT
GLOVE SURG UNDER POLY LF SZ8 (GLOVE) ×4
GLOVE SURG UNDER POLY LF SZ8.5 (GLOVE) ×4 IMPLANT
GOWN STRL REUS W/ TWL LRG LVL3 (GOWN DISPOSABLE) IMPLANT
GOWN STRL REUS W/ TWL XL LVL3 (GOWN DISPOSABLE) ×2 IMPLANT
GOWN STRL REUS W/TWL 2XL LVL3 (GOWN DISPOSABLE) ×4 IMPLANT
GOWN STRL REUS W/TWL LRG LVL3 (GOWN DISPOSABLE)
GOWN STRL REUS W/TWL XL LVL3 (GOWN DISPOSABLE) ×4
HEMOSTAT POWDER KIT SURGIFOAM (HEMOSTASIS) ×2 IMPLANT
KIT BASIN OR (CUSTOM PROCEDURE TRAY) ×2 IMPLANT
KIT POSITION SURG JACKSON T1 (MISCELLANEOUS) ×2 IMPLANT
KIT TURNOVER KIT B (KITS) ×2 IMPLANT
MARKER SPHERE PSV REFLC 13MM (MARKER) ×14 IMPLANT
MILL MEDIUM DISP (BLADE) IMPLANT
NEEDLE HYPO 25X1 1.5 SAFETY (NEEDLE) ×2 IMPLANT
NEEDLE SPNL 18GX3.5 QUINCKE PK (NEEDLE) IMPLANT
NS IRRIG 1000ML POUR BTL (IV SOLUTION) ×2 IMPLANT
OIL CARTRIDGE MAESTRO DRILL (MISCELLANEOUS) ×4
PACK LAMINECTOMY NEURO (CUSTOM PROCEDURE TRAY) ×2 IMPLANT
PAD ARMBOARD 7.5X6 YLW CONV (MISCELLANEOUS) ×6 IMPLANT
PATTIES SURGICAL .5 X.5 (GAUZE/BANDAGES/DRESSINGS) IMPLANT
PATTIES SURGICAL .5 X1 (DISPOSABLE) IMPLANT
PATTIES SURGICAL 1X1 (DISPOSABLE) IMPLANT
PUTTY DBM PROPEL LRG (Putty) ×4 IMPLANT
ROD RELINE 0-0 CON M 5.0/6.0MM (Rod) ×4 IMPLANT
ROD RELINE-O 5.5X300 STRT NS (Rod) ×1 IMPLANT
ROD RELINE-O 5.5X300MM STRT (Rod) ×2 IMPLANT
SCREW LOCK RELINE 5.5 TULIP (Screw) ×18 IMPLANT
SCREW RELINE-O 8.5X90MM POLY (Screw) ×4 IMPLANT
SCREW RELINE-O POLY 7.5X40 (Screw) ×2 IMPLANT
SCREW RELINE-O POLY 7.5X45 (Screw) ×2 IMPLANT
SPONGE SURGIFOAM ABS GEL 100 (HEMOSTASIS) IMPLANT
SPONGE T-LAP 4X18 ~~LOC~~+RFID (SPONGE) ×4 IMPLANT
STAPLER SKIN PROX WIDE 3.9 (STAPLE) IMPLANT
STRIP CLOSURE SKIN 1/2X4 (GAUZE/BANDAGES/DRESSINGS) ×2 IMPLANT
SUT VIC AB 1 CT1 18XBRD ANBCTR (SUTURE) ×2 IMPLANT
SUT VIC AB 1 CT1 8-18 (SUTURE) ×4
SUT VIC AB 2-0 CT1 18 (SUTURE) ×4 IMPLANT
SUT VIC AB 3-0 SH 8-18 (SUTURE) ×6 IMPLANT
SYR 5ML LL (SYRINGE) IMPLANT
TOWEL GREEN STERILE (TOWEL DISPOSABLE) ×2 IMPLANT
TOWEL GREEN STERILE FF (TOWEL DISPOSABLE) ×2 IMPLANT
TRAY FOLEY MTR SLVR 16FR STAT (SET/KITS/TRAYS/PACK) ×2 IMPLANT
WATER STERILE IRR 1000ML POUR (IV SOLUTION) ×2 IMPLANT

## 2021-08-24 NOTE — Anesthesia Procedure Notes (Signed)
Procedure Name: Intubation Date/Time: 08/24/2021 9:06 AM Performed by: Gwenyth Allegra, CRNA Pre-anesthesia Checklist: Patient identified, Emergency Drugs available, Suction available, Timeout performed and Patient being monitored Oxygen Delivery Method: Circle system utilized Preoxygenation: Pre-oxygenation with 100% oxygen Induction Type: IV induction Ventilation: Mask ventilation without difficulty and Oral airway inserted - appropriate to patient size Grade View: Grade II Tube type: Oral Tube size: 8.0 mm Number of attempts: 1 Airway Equipment and Method: Stylet Placement Confirmation: ETT inserted through vocal cords under direct vision, breath sounds checked- equal and bilateral and positive ETCO2 Secured at: 22 cm Tube secured with: Tape Dental Injury: Teeth and Oropharynx as per pre-operative assessment

## 2021-08-24 NOTE — Transfer of Care (Signed)
Immediate Anesthesia Transfer of Care Note  Patient: Michael Hill  Procedure(s) Performed: Lumbar 5 Sacral 1 Decompression/fusion/exploration of adjacent level fusion with extension to pelvis (Spine Lumbar) APPLICATION OF INTRAOPERATIVE CT SCAN (Spine Lumbar) APPLICATION OF CELL SAVER (Spine Lumbar)  Patient Location: PACU  Anesthesia Type:General  Level of Consciousness: drowsy  Airway & Oxygen Therapy: Patient Spontanous Breathing and Patient connected to face mask oxygen  Post-op Assessment: Report given to RN and Post -op Vital signs reviewed and stable  Post vital signs: Reviewed and stable  Last Vitals:  Vitals Value Taken Time  BP 120/60 08/24/21 1406  Temp    Pulse 106 08/24/21 1410  Resp 14 08/24/21 1410  SpO2 95% 08/24/21 1410  Vitals shown include unvalidated device data.  Last Pain:  Vitals:   08/24/21 0644  TempSrc:   PainSc: 0-No pain      Patients Stated Pain Goal: 0 (08/24/21 0644)  Complications: No notable events documented.

## 2021-08-24 NOTE — Interval H&P Note (Signed)
History and Physical Interval Note:  08/24/2021 8:01 AM  Michael Hill  has presented today for surgery, with the diagnosis of Pseudoarthrosis of lumbar spine.  The various methods of treatment have been discussed with the patient and family. After consideration of risks, benefits and other options for treatment, the patient has consented to  Procedure(s) with comments: Lumbar 5 Sacral 1 Decompression/fusion/exploration of adjacent level fusion with extension to pelvis (N/A) - 3C/RM 21 APPLICATION OF INTRAOPERATIVE CT SCAN (N/A) APPLICATION OF CELL SAVER (N/A) as a surgical intervention.  The patient's history has been reviewed, patient examined, no change in status, stable for surgery.  I have reviewed the patient's chart and labs.  Questions were answered to the patient's satisfaction.     Dorian Heckle

## 2021-08-24 NOTE — Op Note (Signed)
08/24/2021  2:08 PM  PATIENT:  Michael Hill  70 y.o. 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, spinal stenosis, lumbar radiculopathy  PROCEDURE:  Procedure(s): Lumbar 5 Sacral 1 Decompression/fusion/exploration of adjacent level fusion with extension to pelvis (N/A) APPLICATION OF INTRAOPERATIVE CT SCAN (N/A) APPLICATION OF CELL SAVER (N/A) decompression L 5 S 1 bilateral laminotomies; pedicle screw fixation L 4 - S2AI, posterolateral arthrodesis  SURGEON:  Surgeon(s) and Role:    Maeola Harman, MD - Primary    * Dawley, Alan Mulder, DO  PHYSICIAN ASSISTANT:   ASSISTANTS: Poteat, RN   ANESTHESIA:   local and general  EBL:  275 mL   BLOOD ADMINISTERED:none  DRAINS: none   LOCAL MEDICATIONS USED:  MARCAINE    and LIDOCAINE   SPECIMEN:  No Specimen  DISPOSITION OF SPECIMEN:  N/A  COUNTS:  YES  TOURNIQUET:  * No tourniquets in log *  DICTATION: INDICATIONS:  Pateint is 70 year old male with chronic and intractable back and bilateral lower extremity pain with marked disc degeneration and spondylosis who had previously undergone decompression and fusion L 2  - S 1  levels.  He had ALIF L 5 S 1 levels and developed a pseudoarthrosis.  It was elected to bring him to surgery for exploration of prior fusion, bilateral L 5 S 1 laminotomies/foraminotomies for stenosis, revision of S 1 screws, S 2 A I screws (pelvic fixation), then posterior fixation with iliac fixation and S 1 fixation using intraoperative navigation with Airo.   PROCEDURE:   Following intubation with GETA, patient was then turned prone on the OR table for Airo intraoperative CT scanner and, after prepping and draping his low back, a midline incision was opened and the previously placed hardware was exposed from L 4 - S 1 levels. Bilateral connectors were  placed between L 4 and L 5 bilaterally.  Bilateral laminoforaminotomies were performed at L 5- S 1 after localizing X ray was obtained.  This was done under Loupe magnification.  The L 5 S 1 level was decompressed with drill, Kerrison rongeurs and lateral recesses were thoroughly decompressed.  Rods were then cut between L 5 and S 1 and S 1 screws were removed and exchanged for 7.5 x 45 mm on the left and 7.5 x 40 mm on the right.  There had been halo-ing around previous screws and they were loose.  New screws appeared to have appropriate fit.  S 2 A I was exposed bilaterally and the spinous process clamp and array were placed at L 4 and a CT scan was obtained.   Subsequently,  8.5 x 90 mm iliac screw was placed on the right using navigation and a screw of the same size was placed on the left.  . Rods and connectors were attached with new screws at  connectors, S 1, S 2 A I.  A stepoff connector was placed on the left at S 1.    Screws were locked down in situ after bending was performed with Bendini.  20 cc of DBM and autograft was placed bilaterally from L 4 - S 2 A I.  .   The incision was closed with 1, 2-0, 3-0 vicryl sutures.  Long-acting Marcaine was injected in the deep musculature.  A sterile occlusive dressing was placed with Dermabond and Opsite Visible.  Patient was extubated in the OR and taken to recovery having tolerated his surgery well.  Counts were correct at the end of the case.   PLAN OF CARE: Admit to inpatient   PATIENT DISPOSITION:  PACU - hemodynamically stable.   Delay start of Pharmacological VTE agent (>24hrs) due to surgical blood loss or risk of bleeding: yes

## 2021-08-24 NOTE — H&P (Signed)
Patient ID:   [id number] Patient: Michael Hill  Date of Birth: 22-Feb-1951 Visit Type: Office Visit   Date: 06/21/2021 12:15 PM Provider: Danae Orleans. Venetia Maxon MD   This 70 year old male presents for back pain.  HISTORY OF PRESENT ILLNESS: 1.  back pain  The patient returns today to discuss whether the injection was helpful and also to review his thoracic imaging  He describes that he had about a 50% relief of pain after about 2 days following the lumbar injection, which was a biforaminal L5-S1 epidural steroid injection performed on 06/03/2021  His thoracic imaging shows extensive disc degeneration through his mid and lower thoracic spine without high-grade stenosis or significant nerve root compression.  I told the patient I was reluctant to do further surgery a in his upper lumbar and lower thoracic region and he feels that his biggest problem is from his pseudoarthrosis at the L5-S1 level and we have agreed to treat this surgically.  This will consist of extension of fusion to his pelvis with foraminotomies at the L5-S1 level.  If his back pain does not resolve or progresses in his thoracolumbar region we can consider addressing that at a later date.  The patient wishes to proceed in this has been set up in the near future.  I answered his and his wife's questions.  Patient education was performed      Medical/Surgical/Interim History Reviewed, no change.  Last detailed document date:04/22/2014.     PAST MEDICAL HISTORY, SURGICAL HISTORY, FAMILY HISTORY, SOCIAL HISTORY AND REVIEW OF SYSTEMS I have reviewed the patient's past medical, surgical, family and social history as well as the comprehensive review of systems as included on the [facility] history form dated 05/13/2021, which I have signed.  Family History: Reviewed, no changes.  Last detailed document date:04/22/2014.   Social History: Reviewed, no changes. Last detailed document date:  04/22/2014.    MEDICATIONS: (added, continued or stopped this visit) Started Medication Directions Instruction Stopped 09/29/2018 amlodipine 5 mg-benazepril 20 mg capsule     atorvastatin 20 mg tablet take 1 tablet by oral route  every day   03/18/2021 oxycodone 5 mg tablet take 1 tablet by oral route every 8 hours as needed for breakthrough pain (DNF 06/03/21)   05/31/2021 oxycodone 5 mg tablet take 1 tablet by oral route  every 8 hours as needed for breakthrough pain (DNF 07/02/21)   05/31/2021 oxycodone 5 mg tablet take 1 tablet by oral route every 8 hours as needed for breakthrough pain (DNF 08/01/21)   03/18/2021 OxyContin 10 mg tablet,crush resistant,extended release take 1 tablet by oral route  every 12 hours (DNF 06/03/21)   05/31/2021 OxyContin 10 mg tablet,crush resistant,extended release take 1 tablet by oral route  every 12 hours (DNF 07/02/21)   05/31/2021 OxyContin 10 mg tablet,crush resistant,extended release take 1 tablet by oral route  every 12 hours (DNF 08/01/21)      ALLERGIES: Ingredient Reaction Medication Name Comment NO KNOWN ALLERGIES    No known allergies. Reviewed, no changes.    PHYSICAL EXAM:  Vitals Date Temp F BP Pulse Ht In Wt Lb BMI BSA Pain Score 06/21/2021  146/76 75 67 214 33.52  4/10     IMPRESSION:  Pseudoarthrosis L5-S1 with some relief after an injection, although not long lasting  PLAN: Proceed with exploration of prior fusion with bilateral foraminotomies L5-S1 with extension of fusion to the pelvis.  This will be performed with Airo intraoperative CT scan and Cell  Saver.  Patient is fitted for LSO brace today.  Patient education was performed.  Orders: Diagnostic Procedures: Assessment Procedure M54.16 Lumbar Spine- AP/Lat Instruction(s)/Education: Assessment Instruction I10 Lifestyle education [phone] Lifestyle education regarding  diet Miscellaneous: Assessment  S32.009K LSO Brace  Completed Orders (this encounter) Order Details Reason Side Interpretation Result Initial Treatment Date Region Lifestyle education regarding diet Encouraged patient to eat well balanced diet.       Lifestyle education Patient will follow up with Primary Care Physician.        Assessment/Plan  # Detail Type Description  1. Assessment Low back pain, unspecified back pain laterality, unspecified chronicity, unspecified whether sciatica present (M54.50).     2. Assessment Scoliosis, unspecified (M41.9).     3. Assessment Radiculopathy, lumbar region (M54.16).     4. Assessment Pseudoarthrosis of lumbar spine (S32.009K).  Plan Orders LSO Brace.     5. Assessment Essential (primary) hypertension (I10).     6. Assessment Body mass index (BMI) 33.0-33.9, adult (N82.95).  Plan Orders Today's instructions / counseling include(s) Lifestyle education regarding diet. Clinical information/comments: Encouraged patient to eat well balanced diet.       Pain Management Plan Pain Scale: 4/10. Method: Numeric Pain Intensity Scale. Location: back. Onset: 12/05/2012. Duration: varies. Quality: discomforting. Pain management follow-up plan of care: Patient will continue medication management..              Provider:  Danae Orleans. Venetia Maxon MD  06/23/2021 08:18 AM    Dictation edited by: Danae Orleans. Venetia Maxon    CC Providers: [facility] [address] [place],  [place]  [address]-   [facility] [address] [place], [place] [address]-               Electronically signed by Danae Orleans Venetia Maxon MD on 06/23/2021 08:18 AM

## 2021-08-24 NOTE — Brief Op Note (Signed)
08/24/2021  2:08 PM  PATIENT:  Michael Hill  70 y.o. 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, spinal stenosis, lumbar radiculopathy  PROCEDURE:  Procedure(s): Lumbar 5 Sacral 1 Decompression/fusion/exploration of adjacent level fusion with extension to pelvis (N/A) APPLICATION OF INTRAOPERATIVE CT SCAN (N/A) APPLICATION OF CELL SAVER (N/A) decompression L 5 S 1 bilateral laminotomies; pedicle screw fixation L 4 - S2AI, posterolateral arthrodesis  SURGEON:  Surgeon(s) and Role:    Maeola Harman, MD - Primary    * Dawley, Alan Mulder, DO  PHYSICIAN ASSISTANT:   ASSISTANTS: Poteat, RN   ANESTHESIA:   local and general  EBL:  275 mL   BLOOD ADMINISTERED:none  DRAINS: none   LOCAL MEDICATIONS USED:  MARCAINE    and LIDOCAINE   SPECIMEN:  No Specimen  DISPOSITION OF SPECIMEN:  N/A  COUNTS:  YES  TOURNIQUET:  * No tourniquets in log *  DICTATION: INDICATIONS:  Pateint is 70 year old male with chronic and intractable back and bilateral lower extremity pain with marked disc degeneration and spondylosis who had previously undergone decompression and fusion L 2  - S 1  levels.  He had ALIF L 5 S 1 levels and developed a pseudoarthrosis.  It was elected to bring him to surgery for exploration of prior fusion, bilateral L 5 S 1 laminotomies/foraminotomies for stenosis, revision of S 1 screws, S 2 A I screws (pelvic fixation), then posterior fixation with iliac fixation and S 1 fixation using intraoperative navigation with Airo.   PROCEDURE:   Following intubation with GETA, patient was then turned prone on the OR table for Airo intraoperative CT scanner and, after prepping and draping his low back, a midline incision was opened and the previously placed hardware was exposed from L 4 - S 1 levels. Bilateral connectors were  placed between L 4 and L 5 bilaterally.  Bilateral laminoforaminotomies were performed at L 5- S 1 after localizing X ray was obtained.  This was done under Loupe magnification.  The L 5 S 1 level was decompressed with drill, Kerrison rongeurs and lateral recesses were thoroughly decompressed.  Rods were then cut between L 5 and S 1 and S 1 screws were removed and exchanged for 7.5 x 45 mm on the left and 7.5 x 40 mm on the right.  There had been halo-ing around previous screws and they were loose.  New screws appeared to have appropriate fit.  S 2 A I was exposed bilaterally and the spinous process clamp and array were placed at L 4 and a CT scan was obtained.   Subsequently,  8.5 x 90 mm iliac screw was placed on the right using navigation and a screw of the same size was placed on the left.  . Rods and connectors were attached with new screws at  connectors, S 1, S 2 A I.  A stepoff connector was placed on the left at S 1.    Screws were locked down in situ after bending was performed with Bendini.  20 cc of DBM and autograft was placed bilaterally from L 4 - S 2 A I.  .   The incision was closed with 1, 2-0, 3-0 vicryl sutures.  Long-acting Marcaine was injected in the deep musculature.  A sterile occlusive dressing was placed with Dermabond and Opsite Visible.  Patient was extubated in the OR and taken to recovery having tolerated his surgery well.  Counts were correct at the end of the case.   PLAN OF CARE: Admit to inpatient   PATIENT DISPOSITION:  PACU - hemodynamically stable.   Delay start of Pharmacological VTE agent (>24hrs) due to surgical blood loss or risk of bleeding: yes

## 2021-08-24 NOTE — Anesthesia Postprocedure Evaluation (Signed)
Anesthesia Post Note  Patient: Michael Hill  Procedure(s) Performed: Lumbar 5 Sacral 1 Decompression/fusion/exploration of adjacent level fusion with extension to pelvis (Spine Lumbar) APPLICATION OF INTRAOPERATIVE CT SCAN (Spine Lumbar) APPLICATION OF CELL SAVER (Spine Lumbar)     Patient location during evaluation: PACU Anesthesia Type: General Level of consciousness: sedated and patient cooperative Pain management: pain level controlled Vital Signs Assessment: post-procedure vital signs reviewed and stable Respiratory status: spontaneous breathing Cardiovascular status: stable Anesthetic complications: no   No notable events documented.  Last Vitals:  Vitals:   08/24/21 1436 08/24/21 1520  BP: 119/62 124/71  Pulse: (!) 110 (!) 102  Resp: (!) 25 20  Temp:  (!) 36.4 C  SpO2: 92%     Last Pain:  Vitals:   08/24/21 1520  TempSrc: Oral  PainSc:                  Lewie Loron

## 2021-08-25 ENCOUNTER — Encounter (HOSPITAL_COMMUNITY): Payer: Self-pay | Admitting: Neurosurgery

## 2021-08-25 MED ORDER — METHOCARBAMOL 500 MG PO TABS: 500.0000 mg | ORAL_TABLET | Freq: Four times a day (QID) | ORAL | 0 refills | Status: DC | PRN

## 2021-08-25 MED FILL — Heparin Sodium (Porcine) Inj 1000 Unit/ML: INTRAMUSCULAR | Qty: 30 | Status: AC

## 2021-08-25 MED FILL — Sodium Chloride IV Soln 0.9%: INTRAVENOUS | Qty: 1000 | Status: AC

## 2021-08-25 NOTE — Plan of Care (Signed)
WNL

## 2021-08-25 NOTE — Evaluation (Signed)
Occupational Therapy Evaluation Patient Details Name: Michael Hill MRN: [id number] DOB: 1951-09-02 Today's Date: 08/25/2021   History of Present Illness Pt is a 70 y/o male who presents s/p L5-S1 PLIF on 08/24/2021. PMH signficant for shingles, HTN, ALIF 2015, B knee arthroscopy (x4 total), R RCR.   Clinical Impression   Pt admitted for procedure listed above. PTA pt reported that he was independent with all ADL's and IADL's, including yard-work, however he was very limited by pain. At this time, pt reports he has very minimal surgical site pain. He was able to complete all basic ADL's at a mod I level, except for LB dressing, due to difficulty reaching his feet without bending. Pt will have wife and children around to assist. OT provided education on compensatory strategies and pt has no further OT needs. Acute OT will sign off.     Recommendations for follow up therapy are one component of a multi-disciplinary discharge planning process, led by the attending physician.  Recommendations may be updated based on patient status, additional functional criteria and insurance authorization.   Follow Up Recommendations  No OT follow up    Equipment Recommendations  None recommended by OT    Recommendations for Other Services       Precautions / Restrictions Precautions Precautions: Fall;Back Precaution Booklet Issued: Yes (comment) Precaution Comments: Reviewed handout and pt was cued for precautions during functional mobility. Required Braces or Orthoses: Spinal Brace Spinal Brace: Lumbar corset;Applied in sitting position Restrictions Weight Bearing Restrictions: No      Mobility Bed Mobility               General bed mobility comments: Pt was recieved standing in the room    Transfers Overall transfer level: Needs assistance Equipment used: None Transfers: Sit to/from Stand Sit to Stand: Supervision         General transfer comment: No assist required. Increased  time due to pain.    Balance Overall balance assessment: Needs assistance Sitting-balance support: Feet supported;No upper extremity supported Sitting balance-Leahy Scale: Fair     Standing balance support: No upper extremity supported;During functional activity Standing balance-Leahy Scale: Fair                             ADL either performed or assessed with clinical judgement   ADL Overall ADL's : At baseline                                       General ADL Comments: Pt has difficulty reaching his feet, worked on hip stretches to complete figure 4 positioning for LB dressing. No other difficulties or concerns with basic ADL's, compensatory strategies reviewed to ensure pt is following spinal precautions     Vision Baseline Vision/History: 0 No visual deficits Ability to See in Adequate Light: 0 Adequate Patient Visual Report: No change from baseline Vision Assessment?: No apparent visual deficits     Perception Perception Perception Tested?: No   Praxis Praxis Praxis tested?: Not tested    Pertinent Vitals/Pain Pain Assessment: Faces Faces Pain Scale: Hurts little more Pain Location: Incision site Pain Descriptors / Indicators: Operative site guarding;Sore Pain Intervention(s): Monitored during session;Repositioned     Hand Dominance Right   Extremity/Trunk Assessment Upper Extremity Assessment Upper Extremity Assessment: Overall WFL for tasks assessed   Lower Extremity Assessment Lower Extremity Assessment: Defer  to PT evaluation   Cervical / Trunk Assessment Cervical / Trunk Assessment: Other exceptions Cervical / Trunk Exceptions: s/p surgery; grossly flexed trunk   Communication Communication Communication: No difficulties   Cognition Arousal/Alertness: Awake/alert Behavior During Therapy: WFL for tasks assessed/performed Overall Cognitive Status: Within Functional Limits for tasks assessed                                      General Comments  VSS on RA    Exercises     Shoulder Instructions      Home Living Family/patient expects to be discharged to:: Private residence Living Arrangements: Spouse/significant other;Children Available Help at Discharge: Family;Available 24 hours/day Type of Home: House Home Access: Stairs to enter [employer] of Steps: 4-5 Entrance Stairs-Rails: Right;Left;Can reach both Home Layout: One level     Bathroom Shower/Tub: Walk-in shower;Tub/shower unit   Bathroom Toilet: Standard     Home Equipment: Walker - 2 wheels;Grab bars - tub/shower;Toilet riser;Adaptive equipment Adaptive Equipment: Reacher        Prior Functioning/Environment Level of Independence: Independent        Comments: Enjoys working in his garden. He is a retired Optometrist        OT Problem List: Decreased strength;Decreased range of motion;Impaired balance (sitting and/or standing);Pain      OT Treatment/Interventions:      OT Goals(Current goals can be found in the care plan section) Acute Rehab OT Goals Patient Stated Goal: To get stronger OT Goal Formulation: With patient Time For Goal Achievement: 08/25/21 Potential to Achieve Goals: Good  OT Frequency:     Barriers to D/C:            Co-evaluation              AM-PAC OT "6 Clicks" Daily Activity     Outcome Measure Help from another person eating meals?: None Help from another person taking care of personal grooming?: None Help from another person toileting, which includes using toliet, bedpan, or urinal?: None Help from another person bathing (including washing, rinsing, drying)?: A Little Help from another person to put on and taking off regular upper body clothing?: None Help from another person to put on and taking off regular lower body clothing?: A Little 6 Click Score: 22   End of Session Equipment Utilized During Treatment: Back brace Nurse Communication: Mobility  status  Activity Tolerance: Patient tolerated treatment well Patient left: in chair;with call bell/phone within reach  OT Visit Diagnosis: Unsteadiness on feet (R26.81);Muscle weakness (generalized) (M62.81);Pain Pain - part of body:  (BAck)                Time: 0981-1914 OT Time Calculation (min): 68 min Charges:  OT General Charges $OT Visit: 1 Visit OT Evaluation $OT Eval Low Complexity: 1 Low OT Treatments $Self Care/Home Management : 53-67 mins  Michael Hill H., OTR/L Acute Rehabilitation  Julianny Milstein Elane Aleisha Paone 08/25/2021, 11:00 AM

## 2021-08-25 NOTE — Discharge Instructions (Signed)
Wound Care Remove outer dressing in 2 -3 days Leave incision open to air. You may shower. Do not scrub directly on incision.  Do not put any creams, lotions, or ointments on incision. Activity Walk each and every day, increasing distance each day. No lifting greater than 5 lbs.  Avoid bending, arching, and twisting. No driving for 2 weeks; may ride as a passenger locally. If provided with back brace, wear when out of bed.  It is not necessary to wear in bed. Diet Resume your normal diet.   Call Your Doctor If Any of These Occur Redness, drainage, or swelling at the wound.  Temperature greater than 101 degrees. Severe pain not relieved by pain medication. Incision starts to come apart.  Follow Up Appt Call today for appointment in 2 weeks ([phone]) or for problems.  If you have any hardware placed in your spine, you will need an x-ray before your appointment.  

## 2021-08-25 NOTE — Progress Notes (Signed)
Subjective: Patient reports that he is recovering well and is pleased with his postoperative status. No acute events overnight.   Objective: Vital signs in last 24 hours: Temp:  [97.5 F (36.4 C)-98.7 F (37.1 C)] 98.4 F (36.9 C) (09/21 0706) Pulse Rate:  [76-114] 76 (09/21 0706) Resp:  [13-25] 18 (09/21 0706) BP: (92-134)/(57-82) 92/57 (09/21 0706) SpO2:  [92 %-96 %] 94 % (09/21 0706)  Intake/Output from previous day: 09/20 0701 - 09/21 0700 In: 1650 [I.V.:1400; IV Piggyback:250] Out: 560 [Urine:285; Blood:275] Intake/Output this shift: No intake/output data recorded.  Physical Exam: Patient is awake, A/O X 4, conversant, and in good spirits. They are in NAD and VSS. Doing well. Speech is fluent and appropriate. MAEW with good strength. Sensation to light touch is intact. PERLA, EOMI. CNs grossly intact. Dressing is clean dry intact. Incision is well approximated with no drainage, erythema, or edema. LSO brace in place   Lab Results: No results for input(s): WBC, HGB, HCT, PLT in the last 72 hours. BMET No results for input(s): NA, K, CL, CO2, GLUCOSE, BUN, CREATININE, CALCIUM in the last 72 hours.  Studies/Results: DG Lumbar Spine 1 View  Result Date: 08/24/2021 CLINICAL DATA:  Intraoperative localization for spine surgery. EXAM: LUMBAR SPINE - 1 VIEW COMPARISON:  CT myelogram 05/10/2021 FINDINGS: There are posterior surgical retractors noted and a surgical instrument marking the L5 level. Extensive fusion hardware noted from L2-S1. IMPRESSION: L5 marked intraoperatively. Electronically Signed   By: Rudie Meyer M.D.   On: 08/24/2021 15:08   CT OArm Limited Study-No Report  Result Date: 08/24/2021 Fluoroscopy was utilized by the requesting physician.  No radiographic interpretation.    Assessment/Plan: Patient is post-op day 1 s/p L5/S1 decompression/fusion/exploration of adjacent level fusion with extension to pelvis. He is recovering well and reports a significant  reduction in his preoperative symptoms.  His only complaint is mild incisional discomfort.  He has ambulated well with nursing staff and is awaiting PT and OT evaluation. Continue LSO brace when OOB. Continue working on pain control, mobility and ambulating patient. Will plan for discharge today.    LOS: 1 day     Council Mechanic, DNP, NP-C 08/25/2021, 8:31 AM

## 2021-08-25 NOTE — Evaluation (Signed)
Physical Therapy Evaluation Patient Details Name: Michael Hill MRN: [id number] DOB: [birth year] Today's Date: 08/25/2021  History of Present Illness  Pt is a 70 y/o male who presents s/p L5-S1 PLIF on 08/24/2021. PMH signficant for shingles, HTN, ALIF 2015, B knee arthroscopy (x4 total), R RCR.   Clinical Impression  Pt admitted with above diagnosis. At the time of PT eval, pt was able to demonstrate transfers and ambulation with gross min guard assist to supervision for safety and no AD. Pt was educated on precautions, brace application/wearing schedule, appropriate activity progression, and car transfer. Pt currently with functional limitations due to the deficits listed below (see PT Problem List). Pt will benefit from skilled PT to increase their independence and safety with mobility to allow discharge to the venue listed below.         Recommendations for follow up therapy are one component of a multi-disciplinary discharge planning process, led by the attending physician.  Recommendations may be updated based on patient status, additional functional criteria and insurance authorization.  Follow Up Recommendations No PT follow up;Supervision - Intermittent    Equipment Recommendations  None recommended by PT    Recommendations for Other Services       Precautions / Restrictions Precautions Precautions: Fall;Back Precaution Booklet Issued: Yes (comment) Precaution Comments: Reviewed handout and pt was cued for precautions during functional mobility. Required Braces or Orthoses: Spinal Brace Spinal Brace: Lumbar corset;Applied in sitting position Restrictions Weight Bearing Restrictions: No      Mobility  Bed Mobility               General bed mobility comments: Pt was received exiting the bathroom with RW. Reviewed log roll.    Transfers Overall transfer level: Needs assistance Equipment used: Rolling walker (2 wheeled);None Transfers: Sit to/from Stand Sit to  Stand: Supervision         General transfer comment: No assist required. Increased time due to pain.  Ambulation/Gait Ambulation/Gait assistance: Supervision Gait Distance (Feet): 500 Feet Assistive device: None Gait Pattern/deviations: Step-through pattern;Decreased stride length;Trunk flexed Gait velocity: Decreased Gait velocity interpretation: 1.31 - 2.62 ft/sec, indicative of limited community ambulator General Gait Details: Opted to attempted without the RW as he was not using it prior to surgery. Posture improved with distance, however frequent cues required for maintenance of upright posture. Noted increased lateral sway of trunk - mirroring a trendelenberg gait pattern however feel it is likely more due to B knee pain (baseline) in combination of glute med weakness.  Stairs Stairs: Yes Stairs assistance: Min guard Stair Management: One rail Right;Step to pattern;Forwards Number of Stairs: 5 General stair comments: VC's for general sequencing and safety. Pt looking down with significantly flexed posture however able to correct with cues.  Wheelchair Mobility    Modified Rankin (Stroke Patients Only)       Balance Overall balance assessment: Needs assistance Sitting-balance support: Feet supported;No upper extremity supported Sitting balance-Leahy Scale: Fair     Standing balance support: No upper extremity supported;During functional activity Standing balance-Leahy Scale: Fair                               Pertinent Vitals/Pain Pain Assessment: Faces Faces Pain Scale: Hurts little more Pain Location: Incision site Pain Descriptors / Indicators: Operative site guarding;Sore Pain Intervention(s): Limited activity within patient's tolerance;Monitored during session;Repositioned    Home Living Family/patient expects to be discharged to:: Private residence Living Arrangements: Spouse/significant other;Children  Available Help at Discharge:  Family;Available 24 hours/day Type of Home: House Home Access: Stairs to enter Entrance Stairs-Rails: Right;Left;Can reach both Entrance Stairs-Number of Steps: 4-5 Home Layout: One level Home Equipment: Walker - 2 wheels;Grab bars - tub/shower;Toilet riser;Adaptive equipment      Prior Function Level of Independence: Independent         Comments: Enjoys working in his garden. He is a retired Designer, multimedia Dominance   Dominant Hand: Right    Extremity/Trunk Assessment   Upper Extremity Assessment Upper Extremity Assessment: Overall WFL for tasks assessed    Lower Extremity Assessment Lower Extremity Assessment: Generalized weakness (Consistent with pre-op diagnosis)    Cervical / Trunk Assessment Cervical / Trunk Assessment: Other exceptions Cervical / Trunk Exceptions: s/p surgery; grossly flexed trunk  Communication   Communication: No difficulties  Cognition Arousal/Alertness: Awake/alert Behavior During Therapy: WFL for tasks assessed/performed Overall Cognitive Status: Within Functional Limits for tasks assessed                                        General Comments      Exercises     Assessment/Plan    PT Assessment Patient needs continued PT services  PT Problem List Decreased strength;Decreased activity tolerance;Decreased balance;Decreased mobility;Decreased knowledge of use of DME;Decreased knowledge of precautions;Decreased safety awareness;Pain       PT Treatment Interventions DME instruction;Gait training;Functional mobility training;Stair training;Therapeutic activities;Therapeutic exercise;Neuromuscular re-education;Patient/family education    PT Goals (Current goals can be found in the Care Plan section)  Acute Rehab PT Goals Patient Stated Goal: Get back out to his garden PT Goal Formulation: With patient Time For Goal Achievement: 09/01/21 Potential to Achieve Goals: Good    Frequency Min 5X/week   Barriers  to discharge        Co-evaluation               AM-PAC PT "6 Clicks" Mobility  Outcome Measure Help needed turning from your back to your side while in a flat bed without using bedrails?: None Help needed moving from lying on your back to sitting on the side of a flat bed without using bedrails?: A Little Help needed moving to and from a bed to a chair (including a wheelchair)?: A Little Help needed standing up from a chair using your arms (e.g., wheelchair or bedside chair)?: A Little Help needed to walk in hospital room?: A Little Help needed climbing 3-5 steps with a railing? : A Little 6 Click Score: 19    End of Session Equipment Utilized During Treatment: Gait belt Activity Tolerance: Patient tolerated treatment well Patient left: with call bell/phone within reach (Sitting EOB awaiting OT) Nurse Communication: Mobility status PT Visit Diagnosis: Unsteadiness on feet (R26.81);Pain Pain - part of body:  (back)    Time: 7371-0626 PT Time Calculation (min) (ACUTE ONLY): 23 min   Charges:   PT Evaluation $PT Eval Low Complexity: 1 Low PT Treatments $Gait Training: 8-22 mins        Conni Slipper, PT, DPT Acute Rehabilitation Services Pager: [phone] Office: [phone]   Marylynn Pearson 08/25/2021, 8:57 AM

## 2021-08-25 NOTE — Discharge Summary (Signed)
Physician Discharge Summary  Patient ID: Michael Hill MRN: [id number] DOB/AGE: 1951/06/20 70 y.o.  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 The patient tolerated the procedure well and was taken to the recovery room and then to the floor in stable condition. The hospital course was routine. There were no complications. The wound remained clean dry and intact. Pt had appropriate back soreness. No complaints of leg pain or new N/T/W. The patient remained afebrile with stable vital signs, and tolerated a regular diet. The patient continued to increase activities, and pain was well controlled with oral pain medications.   Consults: None  Significant Diagnostic Studies: radiology: X-Ray: intraoperative   Treatments: surgery: L5/S1 decompression/fusion/exploration of adjacent level fusion with extension to pelvis  Discharge Exam: Blood pressure (!) 92/57, pulse 76, temperature 98.4 F (36.9 C), temperature source Oral, resp. rate 18, height 5\' 7"  (1.702 m), weight 93 kg, SpO2 94 %.  Physical Exam: Patient is awake, A/O X 4, conversant, and in good spirits. They are in NAD and VSS. Doing well. Speech is fluent and appropriate. MAEW with good strength. Sensation to light touch is intact. PERLA, EOMI. CNs grossly intact. Dressing is clean dry intact. Incision is well approximated with no drainage, erythema, or edema. LSO brace in place.  Disposition: Discharge disposition: 01-Home or  Self Care        Allergies as of 08/25/2021   No Known Allergies      Medication List     TAKE these medications    amLODipine-benazepril 5-20 MG capsule Commonly known as: LOTREL Take 1 capsule by mouth daily.   atorvastatin 20 MG tablet Commonly known as: LIPITOR Take 20 mg by mouth daily with supper.   gabapentin 300 MG capsule Commonly known as: NEURONTIN Take 300 mg by mouth in the morning, at noon, and at bedtime. Take up to 2 capsules by mouth 3 times a day   methocarbamol 500 MG tablet Commonly known as: ROBAXIN Take 1 tablet (500 mg total) by mouth every 6 (six) hours as needed for muscle spasms.   oxyCODONE 10 mg 12 hr tablet Commonly known as: OXYCONTIN Take 10 mg by mouth every 12 (twelve) hours.   oxyCODONE 5 MG immediate release tablet Commonly known as: Oxy IR/ROXICODONE Take 5 mg by mouth 3 (three) times daily as needed for moderate pain.   traMADol 50 MG tablet Commonly known as: ULTRAM Take 50 mg by mouth every 8 (eight) hours as needed for pain.         Signed: 08/27/2021, DNP, NP-C 08/25/2021, 9:09 AM

## 2021-08-25 NOTE — Progress Notes (Signed)
Patient alert and oriented, mae's well, voiding adequate amount of urine, swallowing without difficulty, no c/o pain at time of discharge. Patient discharged home with family. Script and discharged instructions given to patient. Patient and family stated understanding of instructions given. Patient has an appointment with Dr.Stern    

## 2021-08-30 ENCOUNTER — Encounter (HOSPITAL_COMMUNITY): Payer: Self-pay | Admitting: Neurosurgery

## 2021-09-08 ENCOUNTER — Inpatient Hospital Stay (HOSPITAL_COMMUNITY)
Admission: EM | Admit: 2021-09-08 | Discharge: 2021-09-14 | DRG: 862 | Disposition: A | Discharge status: Home Health | Payer: Medicare PPO | Attending: Internal Medicine | Admitting: Internal Medicine

## 2021-09-08 ENCOUNTER — Inpatient Hospital Stay (HOSPITAL_COMMUNITY): Payer: Medicare PPO

## 2021-09-08 ENCOUNTER — Encounter (HOSPITAL_COMMUNITY): Payer: Self-pay

## 2021-09-08 ENCOUNTER — Other Ambulatory Visit: Payer: Self-pay | Admitting: Neurosurgery

## 2021-09-08 ENCOUNTER — Emergency Department (HOSPITAL_COMMUNITY): Payer: Medicare PPO

## 2021-09-08 ENCOUNTER — Inpatient Hospital Stay (HOSPITAL_COMMUNITY): Payer: Medicare PPO | Admitting: Anesthesiology

## 2021-09-08 ENCOUNTER — Encounter (HOSPITAL_COMMUNITY)
Admission: EM | Disposition: A | Discharge status: Home Health | Payer: Self-pay | Source: Home / Self Care | Attending: Internal Medicine

## 2021-09-08 DIAGNOSIS — J9601 Acute respiratory failure with hypoxia: Secondary | ICD-10-CM | POA: Diagnosis not present

## 2021-09-08 DIAGNOSIS — R918 Other nonspecific abnormal finding of lung field: Secondary | ICD-10-CM | POA: Diagnosis not present

## 2021-09-08 DIAGNOSIS — R7989 Other specified abnormal findings of blood chemistry: Secondary | ICD-10-CM | POA: Diagnosis not present

## 2021-09-08 DIAGNOSIS — Z809 Family history of malignant neoplasm, unspecified: Secondary | ICD-10-CM | POA: Diagnosis not present

## 2021-09-08 DIAGNOSIS — T8142XA Infection following a procedure, deep incisional surgical site, initial encounter: Secondary | ICD-10-CM | POA: Diagnosis not present

## 2021-09-08 DIAGNOSIS — Z8249 Family history of ischemic heart disease and other diseases of the circulatory system: Secondary | ICD-10-CM

## 2021-09-08 DIAGNOSIS — R0602 Shortness of breath: Secondary | ICD-10-CM | POA: Diagnosis not present

## 2021-09-08 DIAGNOSIS — I2510 Atherosclerotic heart disease of native coronary artery without angina pectoris: Secondary | ICD-10-CM | POA: Diagnosis present

## 2021-09-08 DIAGNOSIS — M4805 Spinal stenosis, thoracolumbar region: Secondary | ICD-10-CM | POA: Diagnosis not present

## 2021-09-08 DIAGNOSIS — I959 Hypotension, unspecified: Secondary | ICD-10-CM | POA: Diagnosis not present

## 2021-09-08 DIAGNOSIS — R7881 Bacteremia: Secondary | ICD-10-CM

## 2021-09-08 DIAGNOSIS — Z79899 Other long term (current) drug therapy: Secondary | ICD-10-CM

## 2021-09-08 DIAGNOSIS — R7401 Elevation of levels of liver transaminase levels: Secondary | ICD-10-CM | POA: Diagnosis not present

## 2021-09-08 DIAGNOSIS — J1282 Pneumonia due to coronavirus disease 2019: Secondary | ICD-10-CM | POA: Diagnosis present

## 2021-09-08 DIAGNOSIS — M546 Pain in thoracic spine: Secondary | ICD-10-CM | POA: Diagnosis not present

## 2021-09-08 DIAGNOSIS — I517 Cardiomegaly: Secondary | ICD-10-CM | POA: Diagnosis not present

## 2021-09-08 DIAGNOSIS — A4101 Sepsis due to Methicillin susceptible Staphylococcus aureus: Secondary | ICD-10-CM | POA: Diagnosis not present

## 2021-09-08 DIAGNOSIS — I110 Hypertensive heart disease with heart failure: Secondary | ICD-10-CM | POA: Diagnosis present

## 2021-09-08 DIAGNOSIS — D62 Acute posthemorrhagic anemia: Secondary | ICD-10-CM | POA: Diagnosis not present

## 2021-09-08 DIAGNOSIS — I5031 Acute diastolic (congestive) heart failure: Secondary | ICD-10-CM | POA: Diagnosis present

## 2021-09-08 DIAGNOSIS — M4316 Spondylolisthesis, lumbar region: Secondary | ICD-10-CM | POA: Diagnosis not present

## 2021-09-08 DIAGNOSIS — K5900 Constipation, unspecified: Secondary | ICD-10-CM | POA: Diagnosis not present

## 2021-09-08 DIAGNOSIS — G8929 Other chronic pain: Secondary | ICD-10-CM | POA: Diagnosis present

## 2021-09-08 DIAGNOSIS — T8144XA Sepsis following a procedure, initial encounter: Secondary | ICD-10-CM | POA: Diagnosis not present

## 2021-09-08 DIAGNOSIS — T8149XA Infection following a procedure, other surgical site, initial encounter: Secondary | ICD-10-CM | POA: Diagnosis not present

## 2021-09-08 DIAGNOSIS — M2578 Osteophyte, vertebrae: Secondary | ICD-10-CM | POA: Diagnosis not present

## 2021-09-08 DIAGNOSIS — J811 Chronic pulmonary edema: Secondary | ICD-10-CM | POA: Diagnosis not present

## 2021-09-08 DIAGNOSIS — E871 Hypo-osmolality and hyponatremia: Secondary | ICD-10-CM | POA: Diagnosis present

## 2021-09-08 DIAGNOSIS — A419 Sepsis, unspecified organism: Secondary | ICD-10-CM | POA: Diagnosis not present

## 2021-09-08 DIAGNOSIS — E7800 Pure hypercholesterolemia, unspecified: Secondary | ICD-10-CM | POA: Diagnosis not present

## 2021-09-08 DIAGNOSIS — R600 Localized edema: Secondary | ICD-10-CM | POA: Diagnosis present

## 2021-09-08 DIAGNOSIS — M545 Low back pain, unspecified: Secondary | ICD-10-CM | POA: Diagnosis present

## 2021-09-08 DIAGNOSIS — Z8619 Personal history of other infectious and parasitic diseases: Secondary | ICD-10-CM

## 2021-09-08 DIAGNOSIS — M48061 Spinal stenosis, lumbar region without neurogenic claudication: Secondary | ICD-10-CM | POA: Diagnosis not present

## 2021-09-08 DIAGNOSIS — Z981 Arthrodesis status: Secondary | ICD-10-CM

## 2021-09-08 DIAGNOSIS — D649 Anemia, unspecified: Secondary | ICD-10-CM | POA: Diagnosis not present

## 2021-09-08 DIAGNOSIS — M960 Pseudarthrosis after fusion or arthrodesis: Secondary | ICD-10-CM | POA: Diagnosis not present

## 2021-09-08 DIAGNOSIS — R000 Tachycardia, unspecified: Secondary | ICD-10-CM | POA: Diagnosis not present

## 2021-09-08 DIAGNOSIS — R6520 Severe sepsis without septic shock: Secondary | ICD-10-CM | POA: Diagnosis present

## 2021-09-08 DIAGNOSIS — Z83438 Family history of other disorder of lipoprotein metabolism and other lipidemia: Secondary | ICD-10-CM

## 2021-09-08 DIAGNOSIS — Z6833 Body mass index (BMI) 33.0-33.9, adult: Secondary | ICD-10-CM | POA: Diagnosis not present

## 2021-09-08 DIAGNOSIS — I081 Rheumatic disorders of both mitral and tricuspid valves: Secondary | ICD-10-CM | POA: Diagnosis not present

## 2021-09-08 DIAGNOSIS — Z6832 Body mass index (BMI) 32.0-32.9, adult: Secondary | ICD-10-CM

## 2021-09-08 DIAGNOSIS — M419 Scoliosis, unspecified: Secondary | ICD-10-CM | POA: Diagnosis present

## 2021-09-08 DIAGNOSIS — T8141XA Infection following a procedure, superficial incisional surgical site, initial encounter: Secondary | ICD-10-CM | POA: Diagnosis not present

## 2021-09-08 DIAGNOSIS — M4620 Osteomyelitis of vertebra, site unspecified: Secondary | ICD-10-CM | POA: Diagnosis not present

## 2021-09-08 DIAGNOSIS — U071 COVID-19: Secondary | ICD-10-CM | POA: Diagnosis present

## 2021-09-08 DIAGNOSIS — E669 Obesity, unspecified: Secondary | ICD-10-CM | POA: Diagnosis present

## 2021-09-08 DIAGNOSIS — I471 Supraventricular tachycardia: Secondary | ICD-10-CM | POA: Diagnosis not present

## 2021-09-08 DIAGNOSIS — K449 Diaphragmatic hernia without obstruction or gangrene: Secondary | ICD-10-CM | POA: Diagnosis not present

## 2021-09-08 DIAGNOSIS — B9561 Methicillin susceptible Staphylococcus aureus infection as the cause of diseases classified elsewhere: Secondary | ICD-10-CM

## 2021-09-08 DIAGNOSIS — E041 Nontoxic single thyroid nodule: Secondary | ICD-10-CM | POA: Diagnosis not present

## 2021-09-08 HISTORY — PX: LUMBAR WOUND DEBRIDEMENT: SHX1988

## 2021-09-08 LAB — LACTIC ACID, PLASMA
Lactic Acid, Venous: 2.2 mmol/L (ref 0.5–1.9)
Lactic Acid, Venous: 2.4 mmol/L (ref 0.5–1.9)
Lactic Acid, Venous: 1.2 mmol/L (ref 0.5–1.9)

## 2021-09-08 LAB — COMPREHENSIVE METABOLIC PANEL
ALT: 25 U/L (ref 0–44)
AST: 44 U/L — ABNORMAL HIGH (ref 15–41)
Albumin: 3.2 g/dL — ABNORMAL LOW (ref 3.5–5.0)
Alkaline Phosphatase: 75 U/L (ref 38–126)
Anion gap: 10 (ref 5–15)
BUN: 25 mg/dL — ABNORMAL HIGH (ref 8–23)
CO2: 26 mmol/L (ref 22–32)
Calcium: 9.1 mg/dL (ref 8.9–10.3)
Chloride: 97 mmol/L — ABNORMAL LOW (ref 98–111)
Creatinine, Ser: 0.95 mg/dL (ref 0.61–1.24)
GFR, Estimated: >60 mL/min (ref >60)
Glucose, Bld: 150 mg/dL — ABNORMAL HIGH (ref 70–99)
Potassium: 4.2 mmol/L (ref 3.5–5.1)
Sodium: 133 mmol/L — ABNORMAL LOW (ref 135–145)
Total Bilirubin: 1.3 mg/dL — ABNORMAL HIGH (ref 0.3–1.2)
Total Protein: 6.5 g/dL (ref 6.5–8.1)

## 2021-09-08 LAB — BLOOD CULTURE ID PANEL (REFLEXED) - BCID2

## 2021-09-08 LAB — CBC WITH DIFFERENTIAL/PLATELET
Abs Immature Granulocytes: 0.31 10*3/uL — ABNORMAL HIGH (ref 0.00–0.07)
Basophils Absolute: 0 10*3/uL (ref 0.0–0.1)
Basophils Relative: 0 %
Eosinophils Absolute: 0 10*3/uL (ref 0.0–0.5)
Eosinophils Relative: 0 %
HCT: 36.3 % — ABNORMAL LOW (ref 39.0–52.0)
Hemoglobin: 11.8 g/dL — ABNORMAL LOW (ref 13.0–17.0)
Immature Granulocytes: 2 %
Lymphocytes Relative: 3 %
Lymphs Abs: 0.5 10*3/uL — ABNORMAL LOW (ref 0.7–4.0)
MCH: 29.8 pg (ref 26.0–34.0)
MCHC: 32.5 g/dL (ref 30.0–36.0)
MCV: 91.7 fL (ref 80.0–100.0)
Monocytes Absolute: 0.9 10*3/uL (ref 0.1–1.0)
Monocytes Relative: 5 %
Neutro Abs: 15.9 10*3/uL — ABNORMAL HIGH (ref 1.7–7.7)
Neutrophils Relative %: 90 %
Platelets: 343 10*3/uL (ref 150–400)
RBC: 3.96 MIL/uL — ABNORMAL LOW (ref 4.22–5.81)
RDW: 13.5 % (ref 11.5–15.5)
WBC: 17.6 10*3/uL — ABNORMAL HIGH (ref 4.0–10.5)
nRBC: 0 % (ref 0.0–0.2)

## 2021-09-08 LAB — D-DIMER, QUANTITATIVE: D-Dimer, Quant: 3.18 ug/mL-FEU — ABNORMAL HIGH (ref 0.00–0.50)

## 2021-09-08 LAB — HEMOGLOBIN AND HEMATOCRIT, BLOOD
HCT: 30.9 % — ABNORMAL LOW (ref 39.0–52.0)
Hemoglobin: 9.9 g/dL — ABNORMAL LOW (ref 13.0–17.0)

## 2021-09-08 LAB — BRAIN NATRIURETIC PEPTIDE: B Natriuretic Peptide: 503.3 pg/mL — ABNORMAL HIGH (ref 0.0–100.0)

## 2021-09-08 LAB — RESP PANEL BY RT-PCR (FLU A&B, COVID) ARPGX2
Influenza A by PCR: NEGATIVE
Influenza B by PCR: NEGATIVE
SARS Coronavirus 2 by RT PCR: POSITIVE — AB

## 2021-09-08 LAB — TYPE AND SCREEN
ABO/RH(D): A POS
Antibody Screen: NEGATIVE

## 2021-09-08 LAB — C-REACTIVE PROTEIN: CRP: 24.3 mg/dL — ABNORMAL HIGH (ref <1.0)

## 2021-09-08 LAB — SEDIMENTATION RATE: Sed Rate: 60 mm/hr — ABNORMAL HIGH (ref 0–16)

## 2021-09-08 LAB — HEPATITIS B SURFACE ANTIGEN: Hepatitis B Surface Ag: NONREACTIVE

## 2021-09-08 LAB — PROCALCITONIN: Procalcitonin: 5.23 ng/mL

## 2021-09-08 LAB — FERRITIN: Ferritin: 135 ng/mL (ref 24–336)

## 2021-09-08 LAB — FIBRINOGEN: Fibrinogen: 726 mg/dL — ABNORMAL HIGH (ref 210–475)

## 2021-09-08 LAB — LACTATE DEHYDROGENASE: LDH: 207 U/L — ABNORMAL HIGH (ref 98–192)

## 2021-09-08 LAB — HIV ANTIBODY (ROUTINE TESTING W REFLEX): HIV Screen 4th Generation wRfx: NONREACTIVE

## 2021-09-08 SURGERY — LUMBAR WOUND DEBRIDEMENT
Anesthesia: General | Site: Spine Lumbar

## 2021-09-08 MED ORDER — OXYCODONE HCL 5 MG PO TABS: 5.0000 mg | ORAL_TABLET | Freq: Three times a day (TID) | ORAL | Status: DC | PRN

## 2021-09-08 MED ORDER — SUCCINYLCHOLINE CHLORIDE 200 MG/10ML IV SOSY
PREFILLED_SYRINGE | INTRAVENOUS | Status: DC | PRN
  Administered 2021-09-08: 120 mg via INTRAVENOUS

## 2021-09-08 MED ORDER — ACETAMINOPHEN 325 MG PO TABS: 650.0000 mg | ORAL_TABLET | Freq: Four times a day (QID) | ORAL | Status: DC | PRN

## 2021-09-08 MED ORDER — SODIUM CHLORIDE 0.9 % IV SOLN
200.0000 mg | Freq: Once | INTRAVENOUS | Status: AC
  Administered 2021-09-08: 200 mg via INTRAVENOUS
  Filled 2021-09-08: qty 40

## 2021-09-08 MED ORDER — MIDAZOLAM HCL 2 MG/2ML IJ SOLN
INTRAMUSCULAR | Status: AC
  Filled 2021-09-08: qty 2

## 2021-09-08 MED ORDER — VANCOMYCIN HCL 2000 MG/400ML IV SOLN
2000.0000 mg | Freq: Once | INTRAVENOUS | Status: AC
  Administered 2021-09-08: 2000 mg via INTRAVENOUS
  Filled 2021-09-08: qty 400

## 2021-09-08 MED ORDER — ACETAMINOPHEN 10 MG/ML IV SOLN
INTRAVENOUS | Status: AC
  Filled 2021-09-08: qty 100

## 2021-09-08 MED ORDER — ALBUTEROL SULFATE (2.5 MG/3ML) 0.083% IN NEBU: 2.5000 mg | INHALATION_SOLUTION | Freq: Four times a day (QID) | RESPIRATORY_TRACT | Status: DC | PRN

## 2021-09-08 MED ORDER — LIDOCAINE 2% (20 MG/ML) 5 ML SYRINGE
INTRAMUSCULAR | Status: AC
  Filled 2021-09-08: qty 5

## 2021-09-08 MED ORDER — ALBUTEROL SULFATE HFA 108 (90 BASE) MCG/ACT IN AERS
2.0000 | INHALATION_SPRAY | Freq: Four times a day (QID) | RESPIRATORY_TRACT | Status: DC
  Administered 2021-09-08: 2 via RESPIRATORY_TRACT
  Filled 2021-09-08: qty 6.7

## 2021-09-08 MED ORDER — DEXAMETHASONE SODIUM PHOSPHATE 10 MG/ML IJ SOLN
INTRAMUSCULAR | Status: AC
  Filled 2021-09-08: qty 1

## 2021-09-08 MED ORDER — FAMOTIDINE 20 MG PO TABS
20.0000 mg | ORAL_TABLET | Freq: Every day | ORAL | Status: DC
  Administered 2021-09-09 – 2021-09-14 (×6): 20 mg via ORAL
  Filled 2021-09-08 (×6): qty 1

## 2021-09-08 MED ORDER — ONDANSETRON HCL 4 MG/2ML IJ SOLN
INTRAMUSCULAR | Status: AC
  Filled 2021-09-08: qty 2

## 2021-09-08 MED ORDER — ALBUTEROL SULFATE HFA 108 (90 BASE) MCG/ACT IN AERS
2.0000 | INHALATION_SPRAY | Freq: Four times a day (QID) | RESPIRATORY_TRACT | Status: DC | PRN
  Filled 2021-09-08: qty 6.7

## 2021-09-08 MED ORDER — ACETAMINOPHEN 10 MG/ML IV SOLN
INTRAVENOUS | Status: DC | PRN
  Administered 2021-09-08: 1000 mg via INTRAVENOUS

## 2021-09-08 MED ORDER — METRONIDAZOLE 500 MG/100ML IV SOLN
500.0000 mg | Freq: Three times a day (TID) | INTRAVENOUS | Status: DC
  Administered 2021-09-08 – 2021-09-09 (×2): 500 mg via INTRAVENOUS
  Filled 2021-09-08 (×3): qty 100

## 2021-09-08 MED ORDER — ATORVASTATIN CALCIUM 10 MG PO TABS
20.0000 mg | ORAL_TABLET | Freq: Every day | ORAL | Status: DC
  Administered 2021-09-08 – 2021-09-14 (×7): 20 mg via ORAL
  Filled 2021-09-08 (×7): qty 2

## 2021-09-08 MED ORDER — OXYCODONE HCL ER 10 MG PO T12A
10.0000 mg | EXTENDED_RELEASE_TABLET | Freq: Two times a day (BID) | ORAL | Status: DC
  Administered 2021-09-08 – 2021-09-14 (×12): 10 mg via ORAL
  Filled 2021-09-08 (×12): qty 1

## 2021-09-08 MED ORDER — PHENYLEPHRINE HCL-NACL 20-0.9 MG/250ML-% IV SOLN
INTRAVENOUS | Status: DC | PRN
  Administered 2021-09-08: 25 ug/min via INTRAVENOUS

## 2021-09-08 MED ORDER — FENTANYL CITRATE (PF) 250 MCG/5ML IJ SOLN
INTRAMUSCULAR | Status: DC | PRN
  Administered 2021-09-08 (×3): 50 ug via INTRAVENOUS
  Administered 2021-09-08: 100 ug via INTRAVENOUS

## 2021-09-08 MED ORDER — SODIUM CHLORIDE 0.9 % IV SOLN
100.0000 mg | Freq: Every day | INTRAVENOUS | Status: AC
  Administered 2021-09-09 – 2021-09-12 (×4): 100 mg via INTRAVENOUS
  Filled 2021-09-08 (×4): qty 20

## 2021-09-08 MED ORDER — ACETAMINOPHEN 650 MG RE SUPP: 650.0000 mg | Freq: Four times a day (QID) | RECTAL | Status: DC | PRN

## 2021-09-08 MED ORDER — ONDANSETRON HCL 4 MG/2ML IJ SOLN
INTRAMUSCULAR | Status: DC | PRN
  Administered 2021-09-08: 4 mg via INTRAVENOUS

## 2021-09-08 MED ORDER — ASCORBIC ACID 500 MG PO TABS
500.0000 mg | ORAL_TABLET | Freq: Every day | ORAL | Status: DC
  Administered 2021-09-08 – 2021-09-14 (×7): 500 mg via ORAL
  Filled 2021-09-08 (×7): qty 1

## 2021-09-08 MED ORDER — FENTANYL CITRATE (PF) 250 MCG/5ML IJ SOLN
INTRAMUSCULAR | Status: AC
  Filled 2021-09-08: qty 5

## 2021-09-08 MED ORDER — SODIUM CHLORIDE 0.9 % IV BOLUS
1000.0000 mL | Freq: Once | INTRAVENOUS | Status: AC
  Administered 2021-09-08: 1000 mL via INTRAVENOUS

## 2021-09-08 MED ORDER — ROCURONIUM BROMIDE 10 MG/ML (PF) SYRINGE
PREFILLED_SYRINGE | INTRAVENOUS | Status: DC | PRN
  Administered 2021-09-08: 40 mg via INTRAVENOUS

## 2021-09-08 MED ORDER — SODIUM CHLORIDE 0.9 % IV SOLN: Freq: Once | INTRAVENOUS | Status: AC

## 2021-09-08 MED ORDER — PHENYLEPHRINE 40 MCG/ML (10ML) SYRINGE FOR IV PUSH (FOR BLOOD PRESSURE SUPPORT)
PREFILLED_SYRINGE | INTRAVENOUS | Status: DC | PRN
  Administered 2021-09-08: 160 ug via INTRAVENOUS
  Administered 2021-09-08 (×2): 120 ug via INTRAVENOUS

## 2021-09-08 MED ORDER — ENOXAPARIN SODIUM 40 MG/0.4ML IJ SOSY
40.0000 mg | PREFILLED_SYRINGE | INTRAMUSCULAR | Status: DC
  Administered 2021-09-08 – 2021-09-13 (×6): 40 mg via SUBCUTANEOUS
  Filled 2021-09-08 (×7): qty 0.4

## 2021-09-08 MED ORDER — VANCOMYCIN HCL 1000 MG IV SOLR
INTRAVENOUS | Status: DC | PRN
  Administered 2021-09-08: 1000 mg via TOPICAL

## 2021-09-08 MED ORDER — SODIUM CHLORIDE 0.9 % IR SOLN
Status: DC | PRN
  Administered 2021-09-08: 3000 mL

## 2021-09-08 MED ORDER — ONDANSETRON HCL 4 MG/2ML IJ SOLN: 4.0000 mg | Freq: Once | INTRAMUSCULAR | Status: DC | PRN

## 2021-09-08 MED ORDER — BUPIVACAINE HCL (PF) 0.5 % IJ SOLN
INTRAMUSCULAR | Status: AC
  Filled 2021-09-08: qty 10

## 2021-09-08 MED ORDER — LACTATED RINGERS IV SOLN: INTRAVENOUS | Status: DC | PRN

## 2021-09-08 MED ORDER — PROPOFOL 10 MG/ML IV BOLUS
INTRAVENOUS | Status: DC | PRN
  Administered 2021-09-08: 150 mg via INTRAVENOUS

## 2021-09-08 MED ORDER — FENTANYL CITRATE (PF) 100 MCG/2ML IJ SOLN: 25.0000 ug | INTRAMUSCULAR | Status: DC | PRN

## 2021-09-08 MED ORDER — EPHEDRINE SULFATE-NACL 50-0.9 MG/10ML-% IV SOSY
PREFILLED_SYRINGE | INTRAVENOUS | Status: DC | PRN
  Administered 2021-09-08: 5 mg via INTRAVENOUS
  Administered 2021-09-08: 10 mg via INTRAVENOUS

## 2021-09-08 MED ORDER — DEXAMETHASONE SODIUM PHOSPHATE 10 MG/ML IJ SOLN
6.0000 mg | INTRAMUSCULAR | Status: DC
  Administered 2021-09-09 – 2021-09-13 (×5): 6 mg via INTRAVENOUS
  Filled 2021-09-08 (×5): qty 0.6

## 2021-09-08 MED ORDER — OXYCODONE HCL 5 MG PO TABS
5.0000 mg | ORAL_TABLET | Freq: Once | ORAL | Status: AC
  Administered 2021-09-08: 5 mg via ORAL
  Filled 2021-09-08: qty 1

## 2021-09-08 MED ORDER — VANCOMYCIN HCL 1500 MG/300ML IV SOLN: 1500.0000 mg | INTRAVENOUS | Status: DC

## 2021-09-08 MED ORDER — OXYCODONE HCL 5 MG PO TABS: 5.0000 mg | ORAL_TABLET | Freq: Once | ORAL | Status: DC | PRN

## 2021-09-08 MED ORDER — BUPIVACAINE HCL (PF) 0.5 % IJ SOLN
INTRAMUSCULAR | Status: AC
  Filled 2021-09-08: qty 30

## 2021-09-08 MED ORDER — ZINC SULFATE 220 (50 ZN) MG PO CAPS
220.0000 mg | ORAL_CAPSULE | Freq: Every day | ORAL | Status: DC
  Administered 2021-09-08 – 2021-09-14 (×7): 220 mg via ORAL
  Filled 2021-09-08 (×7): qty 1

## 2021-09-08 MED ORDER — ALBUTEROL SULFATE (2.5 MG/3ML) 0.083% IN NEBU: 2.5000 mg | INHALATION_SOLUTION | Freq: Four times a day (QID) | RESPIRATORY_TRACT | Status: DC

## 2021-09-08 MED ORDER — OXYCODONE HCL 5 MG/5ML PO SOLN: 5.0000 mg | Freq: Once | ORAL | Status: DC | PRN

## 2021-09-08 MED ORDER — SODIUM CHLORIDE 0.9 % IV SOLN
2.0000 g | INTRAVENOUS | Status: DC
  Administered 2021-09-08: 2 g via INTRAVENOUS
  Filled 2021-09-08: qty 20

## 2021-09-08 MED ORDER — CEFAZOLIN SODIUM-DEXTROSE 2-4 GM/100ML-% IV SOLN
2.0000 g | Freq: Three times a day (TID) | INTRAVENOUS | Status: DC
  Administered 2021-09-08 – 2021-09-14 (×18): 2 g via INTRAVENOUS
  Filled 2021-09-08 (×20): qty 100

## 2021-09-08 MED ORDER — LIDOCAINE 2% (20 MG/ML) 5 ML SYRINGE
INTRAMUSCULAR | Status: DC | PRN
  Administered 2021-09-08: 60 mg via INTRAVENOUS

## 2021-09-08 MED ORDER — SODIUM CHLORIDE 0.9 % IV SOLN
200.0000 mg | Freq: Once | INTRAVENOUS | Status: DC
  Filled 2021-09-08: qty 40

## 2021-09-08 MED ORDER — IOHEXOL 350 MG/ML SOLN
75.0000 mL | Freq: Once | INTRAVENOUS | Status: AC | PRN
  Administered 2021-09-08: 75 mL via INTRAVENOUS

## 2021-09-08 MED ORDER — MIDAZOLAM HCL 5 MG/5ML IJ SOLN
INTRAMUSCULAR | Status: DC | PRN
  Administered 2021-09-08: 2 mg via INTRAVENOUS

## 2021-09-08 MED ORDER — LIDOCAINE-EPINEPHRINE 1 %-1:100000 IJ SOLN
INTRAMUSCULAR | Status: AC
  Filled 2021-09-08: qty 2

## 2021-09-08 MED ORDER — GUAIFENESIN-DM 100-10 MG/5ML PO SYRP: 10.0000 mL | ORAL_SOLUTION | ORAL | Status: DC | PRN

## 2021-09-08 MED ORDER — IOHEXOL 350 MG/ML SOLN
50.0000 mL | Freq: Once | INTRAVENOUS | Status: AC | PRN
  Administered 2021-09-08: 50 mL via INTRAVENOUS

## 2021-09-08 MED ORDER — VANCOMYCIN HCL 1000 MG IV SOLR
INTRAVENOUS | Status: AC
  Filled 2021-09-08: qty 20

## 2021-09-08 MED ORDER — FENTANYL CITRATE (PF) 100 MCG/2ML IJ SOLN
INTRAMUSCULAR | Status: AC
  Filled 2021-09-08: qty 2

## 2021-09-08 MED ORDER — PROPOFOL 10 MG/ML IV BOLUS
INTRAVENOUS | Status: AC
  Filled 2021-09-08: qty 20

## 2021-09-08 MED ORDER — SUGAMMADEX SODIUM 200 MG/2ML IV SOLN
INTRAVENOUS | Status: DC | PRN
  Administered 2021-09-08: 200 mg via INTRAVENOUS

## 2021-09-08 MED ORDER — OXYCODONE-ACETAMINOPHEN 5-325 MG PO TABS
1.0000 | ORAL_TABLET | Freq: Once | ORAL | Status: AC
Start: 2021-09-08 — End: 2021-09-08
  Administered 2021-09-08: 1 via ORAL
  Filled 2021-09-08: qty 1

## 2021-09-08 MED ORDER — ROCURONIUM BROMIDE 10 MG/ML (PF) SYRINGE
PREFILLED_SYRINGE | INTRAVENOUS | Status: AC
  Filled 2021-09-08: qty 10

## 2021-09-08 MED ORDER — DEXAMETHASONE SODIUM PHOSPHATE 10 MG/ML IJ SOLN
INTRAMUSCULAR | Status: DC | PRN
  Administered 2021-09-08: 8 mg via INTRAVENOUS

## 2021-09-08 SURGICAL SUPPLY — 40 items
ADH SKN CLS APL DERMABOND .7 (GAUZE/BANDAGES/DRESSINGS) ×1
BAG COUNTER SPONGE SURGICOUNT (BAG) ×2 IMPLANT
BAG SPNG CNTER NS LX DISP (BAG) ×1
CANISTER SUCT 3000ML PPV (MISCELLANEOUS) ×2 IMPLANT
DERMABOND ADVANCED (GAUZE/BANDAGES/DRESSINGS) ×1
DERMABOND ADVANCED .7 DNX12 (GAUZE/BANDAGES/DRESSINGS) ×1 IMPLANT
DRAPE LAPAROTOMY 100X72X124 (DRAPES) ×2 IMPLANT
DRAPE SURG 17X23 STRL (DRAPES) ×2 IMPLANT
DRSG OPSITE POSTOP 4X8 (GAUZE/BANDAGES/DRESSINGS) ×2 IMPLANT
DURAPREP 26ML APPLICATOR (WOUND CARE) ×2 IMPLANT
ELECT REM PT RETURN 9FT ADLT (ELECTROSURGICAL) ×2
ELECTRODE REM PT RTRN 9FT ADLT (ELECTROSURGICAL) ×1 IMPLANT
EVACUATOR 1/8 PVC DRAIN (DRAIN) ×2 IMPLANT
GAUZE 4X4 16PLY ~~LOC~~+RFID DBL (SPONGE) ×2 IMPLANT
GLOVE SRG 8 PF TXTR STRL LF DI (GLOVE) ×1 IMPLANT
GLOVE SURG ENC MOIS LTX SZ8 (GLOVE) ×2 IMPLANT
GLOVE SURG LTX SZ8 (GLOVE) ×2 IMPLANT
GLOVE SURG UNDER POLY LF SZ8 (GLOVE) ×2
GLOVE SURG UNDER POLY LF SZ8.5 (GLOVE) ×2 IMPLANT
GOWN STRL REUS W/ TWL LRG LVL3 (GOWN DISPOSABLE) ×1 IMPLANT
GOWN STRL REUS W/ TWL XL LVL3 (GOWN DISPOSABLE) ×3 IMPLANT
GOWN STRL REUS W/TWL 2XL LVL3 (GOWN DISPOSABLE) ×2 IMPLANT
GOWN STRL REUS W/TWL LRG LVL3 (GOWN DISPOSABLE) ×2
GOWN STRL REUS W/TWL XL LVL3 (GOWN DISPOSABLE) ×6
KIT TURNOVER KIT B (KITS) ×2 IMPLANT
NS IRRIG 1000ML POUR BTL (IV SOLUTION) ×6 IMPLANT
PACK LAMINECTOMY NEURO (CUSTOM PROCEDURE TRAY) ×2 IMPLANT
PAD ARMBOARD 7.5X6 YLW CONV (MISCELLANEOUS) ×2 IMPLANT
STAPLER SKIN PROX WIDE 3.9 (STAPLE) ×2 IMPLANT
SUT VIC AB 0 CT1 18XCR BRD8 (SUTURE) ×1 IMPLANT
SUT VIC AB 0 CT1 8-18 (SUTURE) ×2
SUT VIC AB 1 CT1 18XCR BRD 8 (SUTURE) ×1 IMPLANT
SUT VIC AB 1 CT1 8-18 (SUTURE) ×2
SUT VIC AB 2-0 CT1 18 (SUTURE) ×4 IMPLANT
SUT VIC AB 3-0 SH 8-18 (SUTURE) ×4 IMPLANT
SWAB COLLECTION DEVICE MRSA (MISCELLANEOUS) ×4 IMPLANT
SWAB CULTURE ESWAB REG 1ML (MISCELLANEOUS) ×4 IMPLANT
TOWEL GREEN STERILE (TOWEL DISPOSABLE) ×2 IMPLANT
TOWEL GREEN STERILE FF (TOWEL DISPOSABLE) ×2 IMPLANT
WATER STERILE IRR 1000ML POUR (IV SOLUTION) ×2 IMPLANT

## 2021-09-08 NOTE — ED Triage Notes (Signed)
Pt comes via [facility], had back surgery here two weeks ago, today began to have back spasms and cloudy drainage, denies fevers.

## 2021-09-08 NOTE — Progress Notes (Addendum)
Pharmacy Antibiotic Note  Michael Hill is a 70 y.o. male admitted on 09/08/2021 with sepsis.  Pharmacy has been consulted for vancomycin dosing.  Patient with a history of hypertension, hyperlipidemia, spondylolysis s/p L5/S1 decompression/fusion/exploration of adjacent adjacent level fusion with extension to pelvis on 08/25/2021 . Patient presenting with purulent drainage from his recent surgical procedure 9/21.  COVID (+) SCr 0.95 which is at baseline WBC 17.6; LA 2.2 >> 2.4; 100.55F  Plan: Rocephin and flagyl per MD Remdesivir x 5 days Vancomycin 2000 mg once then 1500 mg q24hr (eAUC 513) unless change in renal function Trend WBC, fever, clinical course F/u cultures & neurosurgery recommendations De-escalate when able Levels at steady state  Height: 5\' 7"  (170.2 cm) Weight: 95.3 kg (210 lb) IBW/kg (Calculated) : 66.1  Temp (24hrs), Avg:99.1 F (37.3 C), Min:98.4 F (36.9 C), Max:100.3 F (37.9 C)  Recent Labs  Lab 09/08/21 0253 09/08/21 0518  WBC 17.6*  --   CREATININE 0.95  --   LATICACIDVEN 2.2* 2.4*    Estimated Creatinine Clearance: 80.8 mL/min (by C-G formula based on SCr of 0.95 mg/dL).    No Known Allergies  Antimicrobials this admission: ceftriaxone 10/5 >>  Metronidazole 10/5 >> vancomycin 10/5 >>   Microbiology results: Pending  Thank you for allowing pharmacy to be a part of this patient's care.  11/08/21, PharmD, BCPS 09/08/2021 2:55 PM ED Clinical Pharmacist -  [phone]

## 2021-09-08 NOTE — ED Provider Notes (Signed)
Emergency Medicine Provider Triage Evaluation Note  Michael Hill , a 70 y.o. male  was evaluated in triage.  Pt complains of drainage from wound.  Patient underwent L5-S1 laminectomy 08/24/21  with Dr. Venetia Maxon.  Felt like he was doing ok today, daughter who is a nurse has been helping at home.  Today he began having purulent drainage from incision and redness to wound.  No fever.  No new numbness/weakness of the legs.  States he is not able to get MRI because of his pre-existing hardware, has only been getting CT's for the past few years.  This does appear to be the case since 2013 based on chart review.  Review of Systems  Positive: Back pain, wound drainage Negative: fever  Physical Exam  BP 114/66   Pulse 98   Temp 98.6 F (37 C) (Oral)   Resp 16   SpO2 98%  Gen:   Awake, no distress   Resp:  Normal effort  MSK:   Moves extremities without difficulty, Dp pulses intact BLE Other:  Midline lumbar incision appears to be healing, there are still sutures in place, there is some erythema and purulent drainage, bandage and shirt has drainage present as well  Medical Decision Making  Medically screening exam initiated at 2:38 AM.  Appropriate orders placed.  [name] was informed that the remainder of the evaluation will be completed by another provider, this initial triage assessment does not replace that evaluation, and the importance of remaining in the ED until their evaluation is complete.  No focal neuro deficits in triage.  Incision with some mild erythema and drainage.  Unable to get MRI so will get CT lumbar w/contrast.  Labs sent.  Made acuity 2, will prioritize room assignment.   Garlon Hatchet, PA-C 09/08/21 0252    Charlynne Pander, MD 09/08/21 [phone]

## 2021-09-08 NOTE — ED Notes (Signed)
RN clarified swabs with lab for wound culture, lab reports that body fluid culture is not appropriate due to this wound being a prior surgical incision, therefore it is not a sterile cavity which is what this order requires. RN did obtain aerobic/anaerobic specimen from incision to lower back.

## 2021-09-08 NOTE — ED Notes (Signed)
Pt incontinent of urine. RN changed sheets, gown, provided new blankets. Pt has drainage noted to dressing on lower back, purulent and bloody in nature. Pt BP remains soft, RN sent secure chat to Dr Wilkie Aye regarding this. Pt to be admitted by hospitalist.

## 2021-09-08 NOTE — H&P (View-Only) (Signed)
Reason for Consult:Lumbar wound infection Referring Physician: Shon Baton, MD   HPI: Michael Hill is a 70 y.o. male with a PmHx of hyperlipidemia, hypertension who is s/p L5/S1 decompression/fusion/exploration of adjacent level fusion with extension to pelvis on 08/25/2021. The patient was discharged home in good condition and was initially recovering well from surgery. He noted increasing pain over the last week that was in the similar location as his pre surgical pain, but stated this pain is different." His daughter noticed drainage from his incision last night and encouraged him to seek evaluation at the ED. He had not noted any fevers but developed a fever while in the emergency department to 100.3.  He has not noted any new numbness, tingling, or weakness in his lower extremities.  He reports that he has difficulty getting to the bathroom on time and has wet himself several times.  He does not believe this is true incontinence.  Past Medical History:  Diagnosis Date   Arthritis    Chronic back pain    scoliosis/stenosis/spondylosis   History of bronchitis    many yrs ago   History of shingles    Hyperlipidemia    takes Lipitor daily   Hypertension    takes Lotrel daily    Past Surgical History:  Procedure Laterality Date   ABDOMINAL EXPOSURE N/A 08/05/2014   Procedure: ABDOMINAL EXPOSURE;  Surgeon: Chuck Hint, MD;  Location: [facility];  Service: Vascular;  Laterality: N/A;   ANTERIOR LAT LUMBAR FUSION Right 08/05/2014   Procedure: Right Lumbar two-three,Lumbar three-four,Lumbar four-five Anterior lateral lumbar interbody fusion;  Surgeon: Maeola Harman, MD;  Location: [facility];  Service: Neurosurgery;  Laterality: Right;  right    ANTERIOR LUMBAR FUSION N/A 08/05/2014   Procedure: Lumbar five-Sacral-One Anterior lumbar interbody fusion with Dr. Edilia Bo for approach; Right Lumbar two-three,Lumbar three-four,Lumbar four-five Anterior lateral lumbar interbody  fusion;  Surgeon: Maeola Harman, MD;  Location: [facility];  Service: Neurosurgery;  Laterality: N/A;   APPLICATION OF INTRAOPERATIVE CT SCAN N/A 08/24/2021   Procedure: APPLICATION OF INTRAOPERATIVE CT SCAN;  Surgeon: Maeola Harman, MD;  Location[facility];  Service: Neurosurgery;  Laterality: N/A;   COLONOSCOPY     EPIDURAL BLOCK INJECTION     KNEE ARTHROSCOPY Bilateral    x 4    LUMBAR FUSION  08/05/2014        DR STERN   LUMBAR PERCUTANEOUS PEDICLE SCREW 3 LEVEL N/A 08/07/2014   Procedure: Stage II Lumbar percutaneous pedicle screw placement L2-S1;  Surgeon: Maeola Harman, MD;  Location: [facility];  Service: Neurosurgery;  Laterality: N/A;  Stage II Lumbar percutaneous pedicle screw placement L2-S1   PROSTATE BIOPSY     ROTATOR CUFF REPAIR Right     Family History  Problem Relation Age of Onset   Heart disease Father        before age 65   Hyperlipidemia Father    Hypertension Father    Hypertension Brother    Heart disease Brother        before age 93   Cancer Daughter     Social History:  reports that he has never smoked. He has never used smokeless tobacco. He reports that he does not drink alcohol and does not use drugs.  Allergies: No Known Allergies  Medications: I have reviewed the patient's current medications.  Results for orders placed or performed during the hospital encounter of 09/08/21 (from the past 48 hour(s))  CBC with Differential  Status: Abnormal   Collection Time: 09/08/21  2:53 AM  Result Value Ref Range   WBC 17.6 (H) 4.0 - 10.5 K/uL   RBC 3.96 (L) 4.22 - 5.81 MIL/uL   Hemoglobin 11.8 (L) 13.0 - 17.0 g/dL   HCT 26.9 (L) 48.5 - 46.2 %   MCV 91.7 80.0 - 100.0 fL   MCH 29.8 26.0 - 34.0 pg   MCHC 32.5 30.0 - 36.0 g/dL   RDW 70.3 50.0 - 93.8 %   Platelets 343 150 - 400 K/uL   nRBC 0.0 0.0 - 0.2 %   Neutrophils Relative % 90 %   Neutro Abs 15.9 (H) 1.7 - 7.7 K/uL   Lymphocytes Relative 3 %   Lymphs Abs 0.5 (L) 0.7 - 4.0 K/uL   Monocytes Relative  5 %   Monocytes Absolute 0.9 0.1 - 1.0 K/uL   Eosinophils Relative 0 %   Eosinophils Absolute 0.0 0.0 - 0.5 K/uL   Basophils Relative 0 %   Basophils Absolute 0.0 0.0 - 0.1 K/uL   Immature Granulocytes 2 %   Abs Immature Granulocytes 0.31 (H) 0.00 - 0.07 K/uL    Comment: Performed at [facility], [address], [place], [place] [address]  Comprehensive metabolic panel     Status: Abnormal   Collection Time: 09/08/21  2:53 AM  Result Value Ref Range   Sodium 133 (L) 135 - 145 mmol/L   Potassium 4.2 3.5 - 5.1 mmol/L   Chloride 97 (L) 98 - 111 mmol/L   CO2 26 22 - 32 mmol/L   Glucose, Bld 150 (H) 70 - 99 mg/dL    Comment: Glucose reference range applies only to samples taken after fasting for at least 8 hours.   BUN 25 (H) 8 - 23 mg/dL   Creatinine, Ser 3.71 0.61 - 1.24 mg/dL   Calcium 9.1 8.9 - 69.6 mg/dL   Total Protein 6.5 6.5 - 8.1 g/dL   Albumin 3.2 (L) 3.5 - 5.0 g/dL   AST 44 (H) 15 - 41 U/L   ALT 25 0 - 44 U/L   Alkaline Phosphatase 75 38 - 126 U/L   Total Bilirubin 1.3 (H) 0.3 - 1.2 mg/dL   GFR, Estimated >78 >93 mL/min    Comment: (NOTE) Calculated using the CKD-EPI Creatinine Equation (2021)    Anion gap 10 5 - 15    Comment: Performed at [facility], [address], [place], [place] [address]  Lactic acid, plasma     Status: Abnormal   Collection Time: 09/08/21  2:53 AM  Result Value Ref Range   Lactic Acid, Venous 2.2 (HH) 0.5 - 1.9 mmol/L    Comment: CRITICAL RESULT CALLED TO, READ BACK BY AND VERIFIED WITH: Esmeralda Links RN 09/08/21 0421 [place] D[place] Performed at [facility], [address], [place], [place] [address]   Resp Panel by RT-PCR (Flu A&B, Covid) Nasopharyngeal Swab     Status: Abnormal   Collection Time: 09/08/21  4:53 AM   Specimen: Nasopharyngeal Swab; Nasopharyngeal(NP) swabs in vial transport medium  Result Value Ref Range   SARS Coronavirus 2 by RT PCR POSITIVE (A) NEGATIVE    Comment: RESULT CALLED TO, READ BACK BY  AND VERIFIED WITH: BESSMAN,RN@0712  09/08/21 MK (NOTE) SARS-CoV-2 target nucleic acids are DETECTED.  The SARS-CoV-2 RNA is generally detectable in upper respiratory specimens during the acute phase of infection. Positive results are indicative of the presence of the identified virus, but do not rule out  bacterial infection or co-infection with other pathogens not detected by the test. Clinical correlation with patient history and other diagnostic information is necessary to determine patient infection status. The expected result is Negative.  Fact Sheet for Patients: BloggerCourse.com  Fact Sheet for Healthcare Providers: SeriousBroker.it  This test is not yet approved or cleared by the [place] FDA and  has been authorized for detection and/or diagnosis of SARS-CoV-2 by FDA under an Emergency Use Authorization (EUA).  This EUA will remain in effect (meaning this test can be used)  for the duration of  the COVID-19 declaration under Section 564(b)(1) of the Act, 21 U.S.C. section 360bbb-3(b)(1), unless the authorization is terminated or revoked sooner.     Influenza A by PCR NEGATIVE NEGATIVE   Influenza B by PCR NEGATIVE NEGATIVE    Comment: (NOTE) The Xpert Xpress SARS-CoV-2/FLU/RSV plus assay is intended as an aid in the diagnosis of influenza from Nasopharyngeal swab specimens and should not be used as a sole basis for treatment. Nasal washings and aspirates are unacceptable for Xpert Xpress SARS-CoV-2/FLU/RSV testing.  Fact Sheet for Patients: BloggerCourse.com  Fact Sheet for Healthcare Providers: SeriousBroker.it  This test is not yet approved or cleared by the [place] FDA and has been authorized for detection and/or diagnosis of SARS-CoV-2 by FDA under an Emergency Use Authorization (EUA). This EUA will remain in effect (meaning this test can be used) for  the duration of the COVID-19 declaration under Section 564(b)(1) of the Act, 21 U.S.C. section 360bbb-3(b)(1), unless the authorization is terminated or revoked.  Performed at [facility], [address], [place], [place] [address]   Lactic acid, plasma     Status: Abnormal   Collection Time: 09/08/21  5:18 AM  Result Value Ref Range   Lactic Acid, Venous 2.4 (HH) 0.5 - 1.9 mmol/L    Comment: CRITICAL VALUE NOTED.  VALUE IS CONSISTENT WITH PREVIOUSLY REPORTED AND CALLED VALUE. Performed at [facility], [address], [place], [place] [address]   Sedimentation rate     Status: Abnormal   Collection Time: 09/08/21  5:25 AM  Result Value Ref Range   Sed Rate 60 (H) 0 - 16 mm/hr    Comment: Performed at [facility], [address], [place], [place] [address]  C-reactive protein     Status: Abnormal   Collection Time: 09/08/21  5:25 AM  Result Value Ref Range   CRP 24.3 (H) <1.0 mg/dL    Comment: Performed at [facility], [address], [place], [place] [address]    CT LUMBAR SPINE W CONTRAST  Result Date: 09/08/2021 CLINICAL DATA:  70 year old male status post multiple spine surgeries, most recently last month with wound drainage. EXAM: CT LUMBAR SPINE WITH CONTRAST TECHNIQUE: Multidetector CT imaging of the lumbar spine was performed with intravenous contrast administration. CONTRAST:  74mL OMNIPAQUE IOHEXOL 350 MG/ML SOLN COMPARISON:  CT lumbar myelogram 05/10/2021. FINDINGS: Segmentation: Normal, the same numbering system used in June. Alignment: Chronic levoconvex lumbar scoliosis is stable. Stable mild retrolisthesis of L1 on L2 and straightening of the visible lower thoracic kyphosis. Vertebrae: Postoperative details are below. Visible lower thoracic levels appear intact. No acute osseous abnormality identified. Paraspinal and other soft tissues: Patchy and indistinct edema or fluid in the subcutaneous soft tissues posterior to the lumbar spine beginning  at the thoracolumbar junction and continuing to the sacrum (series 5, image 109). No rim enhancing or organized collection. No soft tissue gas. Aortoiliac calcified  atherosclerosis. Negative visible costophrenic angles and abdominal viscera. Disc levels: T10-T11 and T11-T12: Vacuum disc and disc space loss. No spinal stenosis. T12-L1: Chronic severe disc space loss. Vacuum disc has increased since June. Circumferential disc osteophyte complex eccentric to the left is stable. No significant spinal stenosis. Moderate left T12 foraminal stenosis. L1-L2: Chronic severe disc space loss with increased vacuum disc. Stable mild retrolisthesis. Circumferential disc osteophyte complex with mild to moderate posterior element hypertrophy appears stable. Stable borderline spinal stenosis suspected, moderate bilateral L1 foraminal stenosis. L2-L3: Chronic posterior and interbody fusion. Stable pedicle screws and interbody implant without loosening. Stable interbody arthrodesis (sagittal image 46). Residual posterior element spurring with no definite posterior element arthrodesis. No stenosis. L3-L4: Chronic posterior and interbody fusion with stable hardware. Stable interbody and right side posterior element arthrodesis. No significant stenosis. L4-L5: A 2nd set of spinal rods begins at this level, but chronic L4 and L5 pedicle screws and interbody implant here appears stable without loosening. However, no convincing arthrodesis. Moderate facet hypertrophy is stable. The L5 screws traverse the facet joints as before. Chronic endplate spurring. Stable borderline to mild osseous foraminal stenosis. L5-S1: Chronic anterior and posterior fusion hardware at this level with mildly increased lucency along the bilateral S1 pedicle screws since June. Some anterior interbody screw loosening also redemonstrated. No arthrodesis. Moderate to severe residual endplate and facet spurring maximal at the left L5 neural foramen. This level has not  significantly changed since June. New bilateral cortical screws traversing the SI joints and extending into the iliac bones appear intact without evidence of loosening. IMPRESSION: 1. Patchy subcutaneous edema and/or fluid throughout the posterior paraspinal soft tissues. No organized or rim enhancing fluid collection, and no soft tissue gas. 2. Chronic L4-L5 and L5-S1 pseudoarthrosis, chronic L5 and S1 hardware loosening. 3. Addition of new bilateral sacroiliac screws and new interlocking posterior spinal rods since June - with no adverse features associated with this hardware. 4. Stable L2 through L4 spinal hardware without loosening, and solid arthrodesis suspected at L2-L3 and L3-L4. 5. Advanced degeneration in the lower thoracic levels and adjacent L1-L2 segment. Increased vacuum disc since June but otherwise stable. 6.  Aortic Atherosclerosis (ICD10-I70.0). Electronically Signed   By: H  Hall M.D.   On: 09/08/2021 06:03    ROS: Per HPI  Blood pressure (!) 91/44, pulse (!) 107, temperature 100.3 F (37.9 C), temperature source Oral, resp. rate (!) 22, height 5' 7" (1.702 m), weight 95.3 kg, SpO2 94 %.  Physical Exam: Patient is awake, A/O X 4, conversant. He is moderately drowsy and appears to be slightly delayed with his mentation They are in NAD and VSS. Doing well. Speech is fluent and appropriate. MAEW with good strength that is symmetric bilaterally. 5/5 BUE/BLE. Sensation to light touch is intact. PERLA, EOMI. CNs grossly intact. Dressing with moderate amount of purulent drainage. Incision is approximated with no erythema or edema. There is some spontaneous purulent drainage noted from the mid inferior portion of the wound.     Assessment/Plan: 69 y.o. male who is s/p L5/S1 decompression/fusion/exploration of adjacent level fusion with extension to pelvis on 08/25/2021. Over the last week he developed increasing lumbosacral pain and noted purulent drainage at his incision site. The surgical  wound has spontaneous purulent drainage, but otherwise appears appropriate. He has leukocytosis to 17 and sed rate of 60, In light of his lab findings and now being febrile, I recommend he be admitted to the medicine team with plan for neurosurgery to take him to   the OR today for lumbar wound I&D.  -Obtain sample from lumbar wound for culture -Begin antibiotics -Lumbar wound I&D today   Council Mechanic, DNP, NP-C 09/08/2021, 9:42 AM

## 2021-09-08 NOTE — Consult Note (Signed)
Reason for Consult:Lumbar wound infection Referring Physician: Shon Baton, MD   HPI: Michael Hill is a 70 y.o. male with a PmHx of hyperlipidemia, hypertension who is s/p L5/S1 decompression/fusion/exploration of adjacent level fusion with extension to pelvis on 08/25/2021. The patient was discharged home in good condition and was initially recovering well from surgery. He noted increasing pain over the last week that was in the similar location as his pre surgical pain, but stated this pain is different." His daughter noticed drainage from his incision last night and encouraged him to seek evaluation at the ED. He had not noted any fevers but developed a fever while in the emergency department to 100.3.  He has not noted any new numbness, tingling, or weakness in his lower extremities.  He reports that he has difficulty getting to the bathroom on time and has wet himself several times.  He does not believe this is true incontinence.  Past Medical History:  Diagnosis Date   Arthritis    Chronic back pain    scoliosis/stenosis/spondylosis   History of bronchitis    many yrs ago   History of shingles    Hyperlipidemia    takes Lipitor daily   Hypertension    takes Lotrel daily    Past Surgical History:  Procedure Laterality Date   ABDOMINAL EXPOSURE N/A 08/05/2014   Procedure: ABDOMINAL EXPOSURE;  Surgeon: Chuck Hint, MD;  Location: [facility];  Service: Vascular;  Laterality: N/A;   ANTERIOR LAT LUMBAR FUSION Right 08/05/2014   Procedure: Right Lumbar two-three,Lumbar three-four,Lumbar four-five Anterior lateral lumbar interbody fusion;  Surgeon: Maeola Harman, MD;  Location: [facility];  Service: Neurosurgery;  Laterality: Right;  right    ANTERIOR LUMBAR FUSION N/A 08/05/2014   Procedure: Lumbar five-Sacral-One Anterior lumbar interbody fusion with Dr. Edilia Bo for approach; Right Lumbar two-three,Lumbar three-four,Lumbar four-five Anterior lateral lumbar interbody  fusion;  Surgeon: Maeola Harman, MD;  Location: [facility];  Service: Neurosurgery;  Laterality: N/A;   APPLICATION OF INTRAOPERATIVE CT SCAN N/A 08/24/2021   Procedure: APPLICATION OF INTRAOPERATIVE CT SCAN;  Surgeon: Maeola Harman, MD;  Location: [facility];  Service: Neurosurgery;  Laterality: N/A;   COLONOSCOPY     EPIDURAL BLOCK INJECTION     KNEE ARTHROSCOPY Bilateral    x 4    LUMBAR FUSION  08/05/2014        DR STERN   LUMBAR PERCUTANEOUS PEDICLE SCREW 3 LEVEL N/A 08/07/2014   Procedure: Stage II Lumbar percutaneous pedicle screw placement L2-S1;  Surgeon: Maeola Harman, MD;  Location: [facility];  Service: Neurosurgery;  Laterality: N/A;  Stage II Lumbar percutaneous pedicle screw placement L2-S1   PROSTATE BIOPSY     ROTATOR CUFF REPAIR Right     Family History  Problem Relation Age of Onset   Heart disease Father        before age 65   Hyperlipidemia Father    Hypertension Father    Hypertension Brother    Heart disease Brother        before age 93   Cancer Daughter     Social History:  reports that he has never smoked. He has never used smokeless tobacco. He reports that he does not drink alcohol and does not use drugs.  Allergies: No Known Allergies  Medications: I have reviewed the patient's current medications.  Results for orders placed or performed during the hospital encounter of 09/08/21 (from the past 48 hour(s))  CBC with Differential  Status: Abnormal   Collection Time: 09/08/21  2:53 AM  Result Value Ref Range   WBC 17.6 (H) 4.0 - 10.5 K/uL   RBC 3.96 (L) 4.22 - 5.81 MIL/uL   Hemoglobin 11.8 (L) 13.0 - 17.0 g/dL   HCT 26.9 (L) 48.5 - 46.2 %   MCV 91.7 80.0 - 100.0 fL   MCH 29.8 26.0 - 34.0 pg   MCHC 32.5 30.0 - 36.0 g/dL   RDW 70.3 50.0 - 93.8 %   Platelets 343 150 - 400 K/uL   nRBC 0.0 0.0 - 0.2 %   Neutrophils Relative % 90 %   Neutro Abs 15.9 (H) 1.7 - 7.7 K/uL   Lymphocytes Relative 3 %   Lymphs Abs 0.5 (L) 0.7 - 4.0 K/uL   Monocytes Relative  5 %   Monocytes Absolute 0.9 0.1 - 1.0 K/uL   Eosinophils Relative 0 %   Eosinophils Absolute 0.0 0.0 - 0.5 K/uL   Basophils Relative 0 %   Basophils Absolute 0.0 0.0 - 0.1 K/uL   Immature Granulocytes 2 %   Abs Immature Granulocytes 0.31 (H) 0.00 - 0.07 K/uL    Comment: Performed at [facility], [address], [place], [place] [address]  Comprehensive metabolic panel     Status: Abnormal   Collection Time: 09/08/21  2:53 AM  Result Value Ref Range   Sodium 133 (L) 135 - 145 mmol/L   Potassium 4.2 3.5 - 5.1 mmol/L   Chloride 97 (L) 98 - 111 mmol/L   CO2 26 22 - 32 mmol/L   Glucose, Bld 150 (H) 70 - 99 mg/dL    Comment: Glucose reference range applies only to samples taken after fasting for at least 8 hours.   BUN 25 (H) 8 - 23 mg/dL   Creatinine, Ser 3.71 0.61 - 1.24 mg/dL   Calcium 9.1 8.9 - 69.6 mg/dL   Total Protein 6.5 6.5 - 8.1 g/dL   Albumin 3.2 (L) 3.5 - 5.0 g/dL   AST 44 (H) 15 - 41 U/L   ALT 25 0 - 44 U/L   Alkaline Phosphatase 75 38 - 126 U/L   Total Bilirubin 1.3 (H) 0.3 - 1.2 mg/dL   GFR, Estimated >78 >93 mL/min    Comment: (NOTE) Calculated using the CKD-EPI Creatinine Equation (2021)    Anion gap 10 5 - 15    Comment: Performed at [facility], [address], [place], [place] [address]  Lactic acid, plasma     Status: Abnormal   Collection Time: 09/08/21  2:53 AM  Result Value Ref Range   Lactic Acid, Venous 2.2 (HH) 0.5 - 1.9 mmol/L    Comment: CRITICAL RESULT CALLED TO, READ BACK BY AND VERIFIED WITH: Esmeralda Links RN 09/08/21 0421 [place] Derry Performed at [facility], [address], [place], [place] [address]   Resp Panel by RT-PCR (Flu A&B, Covid) Nasopharyngeal Swab     Status: Abnormal   Collection Time: 09/08/21  4:53 AM   Specimen: Nasopharyngeal Swab; Nasopharyngeal(NP) swabs in vial transport medium  Result Value Ref Range   SARS Coronavirus 2 by RT PCR POSITIVE (A) NEGATIVE    Comment: RESULT CALLED TO, READ BACK BY  AND VERIFIED WITH: BESSMAN,RN@0712  09/08/21 MK (NOTE) SARS-CoV-2 target nucleic acids are DETECTED.  The SARS-CoV-2 RNA is generally detectable in upper respiratory specimens during the acute phase of infection. Positive results are indicative of the presence of the identified virus, but do not rule out  bacterial infection or co-infection with other pathogens not detected by the test. Clinical correlation with patient history and other diagnostic information is necessary to determine patient infection status. The expected result is Negative.  Fact Sheet for Patients: BloggerCourse.com  Fact Sheet for Healthcare Providers: SeriousBroker.it  This test is not yet approved or cleared by the [place] FDA and  has been authorized for detection and/or diagnosis of SARS-CoV-2 by FDA under an Emergency Use Authorization (EUA).  This EUA will remain in effect (meaning this test can be used)  for the duration of  the COVID-19 declaration under Section 564(b)(1) of the Act, 21 U.S.C. section 360bbb-3(b)(1), unless the authorization is terminated or revoked sooner.     Influenza A by PCR NEGATIVE NEGATIVE   Influenza B by PCR NEGATIVE NEGATIVE    Comment: (NOTE) The Xpert Xpress SARS-CoV-2/FLU/RSV plus assay is intended as an aid in the diagnosis of influenza from Nasopharyngeal swab specimens and should not be used as a sole basis for treatment. Nasal washings and aspirates are unacceptable for Xpert Xpress SARS-CoV-2/FLU/RSV testing.  Fact Sheet for Patients: BloggerCourse.com  Fact Sheet for Healthcare Providers: SeriousBroker.it  This test is not yet approved or cleared by the [place] FDA and has been authorized for detection and/or diagnosis of SARS-CoV-2 by FDA under an Emergency Use Authorization (EUA). This EUA will remain in effect (meaning this test can be used) for  the duration of the COVID-19 declaration under Section 564(b)(1) of the Act, 21 U.S.C. section 360bbb-3(b)(1), unless the authorization is terminated or revoked.  Performed at [facility], [address], [place], [place] [address]   Lactic acid, plasma     Status: Abnormal   Collection Time: 09/08/21  5:18 AM  Result Value Ref Range   Lactic Acid, Venous 2.4 (HH) 0.5 - 1.9 mmol/L    Comment: CRITICAL VALUE NOTED.  VALUE IS CONSISTENT WITH PREVIOUSLY REPORTED AND CALLED VALUE. Performed at [facility], [address], [place], [place] [address]   Sedimentation rate     Status: Abnormal   Collection Time: 09/08/21  5:25 AM  Result Value Ref Range   Sed Rate 60 (H) 0 - 16 mm/hr    Comment: Performed at [facility], [address], [place], [place] [address]  C-reactive protein     Status: Abnormal   Collection Time: 09/08/21  5:25 AM  Result Value Ref Range   CRP 24.3 (H) <1.0 mg/dL    Comment: Performed at [facility], [address], [place], [place] [address]    CT LUMBAR SPINE W CONTRAST  Result Date: 09/08/2021 CLINICAL DATA:  70 year old male status post multiple spine surgeries, most recently last month with wound drainage. EXAM: CT LUMBAR SPINE WITH CONTRAST TECHNIQUE: Multidetector CT imaging of the lumbar spine was performed with intravenous contrast administration. CONTRAST:  74mL OMNIPAQUE IOHEXOL 350 MG/ML SOLN COMPARISON:  CT lumbar myelogram 05/10/2021. FINDINGS: Segmentation: Normal, the same numbering system used in June. Alignment: Chronic levoconvex lumbar scoliosis is stable. Stable mild retrolisthesis of L1 on L2 and straightening of the visible lower thoracic kyphosis. Vertebrae: Postoperative details are below. Visible lower thoracic levels appear intact. No acute osseous abnormality identified. Paraspinal and other soft tissues: Patchy and indistinct edema or fluid in the subcutaneous soft tissues posterior to the lumbar spine beginning  at the thoracolumbar junction and continuing to the sacrum (series 5, image 109). No rim enhancing or organized collection. No soft tissue gas. Aortoiliac calcified  atherosclerosis. Negative visible costophrenic angles and abdominal viscera. Disc levels: T10-T11 and T11-T12: Vacuum disc and disc space loss. No spinal stenosis. T12-L1: Chronic severe disc space loss. Vacuum disc has increased since June. Circumferential disc osteophyte complex eccentric to the left is stable. No significant spinal stenosis. Moderate left T12 foraminal stenosis. L1-L2: Chronic severe disc space loss with increased vacuum disc. Stable mild retrolisthesis. Circumferential disc osteophyte complex with mild to moderate posterior element hypertrophy appears stable. Stable borderline spinal stenosis suspected, moderate bilateral L1 foraminal stenosis. L2-L3: Chronic posterior and interbody fusion. Stable pedicle screws and interbody implant without loosening. Stable interbody arthrodesis (sagittal image 46). Residual posterior element spurring with no definite posterior element arthrodesis. No stenosis. L3-L4: Chronic posterior and interbody fusion with stable hardware. Stable interbody and right side posterior element arthrodesis. No significant stenosis. L4-L5: A 2nd set of spinal rods begins at this level, but chronic L4 and L5 pedicle screws and interbody implant here appears stable without loosening. However, no convincing arthrodesis. Moderate facet hypertrophy is stable. The L5 screws traverse the facet joints as before. Chronic endplate spurring. Stable borderline to mild osseous foraminal stenosis. L5-S1: Chronic anterior and posterior fusion hardware at this level with mildly increased lucency along the bilateral S1 pedicle screws since June. Some anterior interbody screw loosening also redemonstrated. No arthrodesis. Moderate to severe residual endplate and facet spurring maximal at the left L5 neural foramen. This level has not  significantly changed since June. New bilateral cortical screws traversing the SI joints and extending into the iliac bones appear intact without evidence of loosening. IMPRESSION: 1. Patchy subcutaneous edema and/or fluid throughout the posterior paraspinal soft tissues. No organized or rim enhancing fluid collection, and no soft tissue gas. 2. Chronic L4-L5 and L5-S1 pseudoarthrosis, chronic L5 and S1 hardware loosening. 3. Addition of new bilateral sacroiliac screws and new interlocking posterior spinal rods since June - with no adverse features associated with this hardware. 4. Stable L2 through L4 spinal hardware without loosening, and solid arthrodesis suspected at L2-L3 and L3-L4. 5. Advanced degeneration in the lower thoracic levels and adjacent L1-L2 segment. Increased vacuum disc since June but otherwise stable. 6.  Aortic Atherosclerosis (ICD10-I70.0). Electronically Signed   By: Odessa Fleming M.D.   On: 09/08/2021 06:03    ROS: Per HPI  Blood pressure (!) 91/44, pulse (!) 107, temperature 100.3 F (37.9 C), temperature source Oral, resp. rate (!) 22, height 5\' 7"  (1.702 m), weight 95.3 kg, SpO2 94 %.  Physical Exam: Patient is awake, A/O X 4, conversant. He is moderately drowsy and appears to be slightly delayed with his mentation They are in NAD and VSS. Doing well. Speech is fluent and appropriate. MAEW with good strength that is symmetric bilaterally. 5/5 BUE/BLE. Sensation to light touch is intact. PERLA, EOMI. CNs grossly intact. Dressing with moderate amount of purulent drainage. Incision is approximated with no erythema or edema. There is some spontaneous purulent drainage noted from the mid inferior portion of the wound.     Assessment/Plan: 70 y.o. male who is s/p L5/S1 decompression/fusion/exploration of adjacent level fusion with extension to pelvis on 08/25/2021. Over the last week he developed increasing lumbosacral pain and noted purulent drainage at his incision site. The surgical  wound has spontaneous purulent drainage, but otherwise appears appropriate. He has leukocytosis to 17 and sed rate of 60, In light of his lab findings and now being febrile, I recommend he be admitted to the medicine team with plan for neurosurgery to take him to  the OR today for lumbar wound I&D.  -Obtain sample from lumbar wound for culture -Begin antibiotics -Lumbar wound I&D today   Council Mechanic, DNP, NP-C 09/08/2021, 9:42 AM

## 2021-09-08 NOTE — Anesthesia Preprocedure Evaluation (Addendum)
Anesthesia Evaluation  Patient identified by MRN, date of birth, ID band Patient awake    Reviewed: Allergy & Precautions, NPO status , Patient's Chart, lab work & pertinent test results  History of Anesthesia Complications Negative for: history of anesthetic complications  Airway Mallampati: II  TM Distance: >3 FB Neck ROM: Full    Dental   Pulmonary neg pulmonary ROS,    breath sounds clear to auscultation       Cardiovascular hypertension, Pt. on medications  Rhythm:Regular Rate:Normal     Neuro/Psych  Neuromuscular disease negative psych ROS   GI/Hepatic negative GI ROS, Neg liver ROS,   Endo/Other   Na 133   Renal/GU negative Renal ROS     Musculoskeletal  (+) Arthritis , narcotic dependent  Abdominal   Peds  Hematology  (+) anemia ,   Anesthesia Other Findings Covid+   Reproductive/Obstetrics                            Anesthesia Physical Anesthesia Plan  ASA: 2  Anesthesia Plan: General   Post-op Pain Management:    Induction: Intravenous and Rapid sequence  PONV Risk Score and Plan: 2 and Treatment may vary due to age or medical condition, Ondansetron and Dexamethasone  Airway Management Planned: Video Laryngoscope Planned and Oral ETT  Additional Equipment: None  Intra-op Plan:   Post-operative Plan: Extubation in OR  Informed Consent: I have reviewed the patients History and Physical, chart, labs and discussed the procedure including the risks, benefits and alternatives for the proposed anesthesia with the patient or authorized representative who has indicated his/her understanding and acceptance.     Dental advisory given  Plan Discussed with: CRNA and Anesthesiologist  Anesthesia Plan Comments: (RSI with glidescope given Covid+ status)       Anesthesia Quick Evaluation

## 2021-09-08 NOTE — ED Notes (Signed)
Pt was taken to surgery by this RN, met by OR staff at elevator. RN took all 3 of pt's antibiotics to OR on stretcher. Pt's son took all of pt's belongings including cell phone and back brace before leaving the ED.

## 2021-09-08 NOTE — ED Notes (Signed)
Report given to Gloria G, RN 

## 2021-09-08 NOTE — Op Note (Signed)
09/08/2021  5:30 PM  Michael Hill:  Michael Hill  70 y.o. male  PRE-OPERATIVE DIAGNOSIS:  Wound infection  POST-OPERATIVE DIAGNOSIS:  Wound infection  PROCEDURE:  Procedure(s): Incision and drainage of lumbar wound (N/A)  SURGEON:  Surgeon(s) and Role:    Maeola Harman, MD - Primary  PHYSICIAN ASSISTANT:   ASSISTANTS: Poteat, RN   ANESTHESIA:   general  EBL:  25 mL   BLOOD ADMINISTERED:none  DRAINS: (Medium) Hemovact drain(s) in the epidural space with  Suction Open   LOCAL MEDICATIONS USED:  NONE  SPECIMEN:  Source of Specimen:  Wound culture  DISPOSITION OF SPECIMEN:  Microbiology  COUNTS:  YES  TOURNIQUET:  * No tourniquets in log *  DICTATION: DICTATION:   INDICATIONS:  Michael Hill is 70 year old man who recently underwent columbo-pelvic fusion.  He developed Covid and a draining lumbar wound.  It was elected to take him to surgery for wound I & D.    PROCEDURE: Michael Hill was brought to the OR.  Following induction of anesthesia, he was placed in a prone position on chest rolls.  His back was prepped with betadine scrub and Duraprep.  The wound was explored and was purulent drainage.  There was a fascial defect and the purulence extended to the hardware. The wound was irrigated with saline and vancomycin after cultures were obtained.  The edges were then closed with 1, 0, 2-0 and 3-0 Vicryl sutures after a medium Hemovac drain was placed.  Sterile occlusive dressings were placed with Dermabond and honeycomb dressings.  The Michael Hill was extubated in the OR and taken to PACU in stable condition.   PLAN OF CARE: Admit to inpatient   Michael Hill DISPOSITION:  PACU - hemodynamically stable.   Delay start of Pharmacological VTE agent (>24hrs) due to surgical blood loss or risk of bleeding: yes

## 2021-09-08 NOTE — Progress Notes (Addendum)
PHARMACY - PHYSICIAN COMMUNICATION CRITICAL VALUE ALERT - BLOOD CULTURE IDENTIFICATION (BCID)  Michael Hill is an 70 y.o. male who presented to [facility] on 09/08/2021 with a post operative wound infection  Assessment:  Blood cultures with GPC/clusters in 4/4 bottles. BCID shows staph aureus with no resistance  Name of physician (or Provider) Contacted: Dr. Rachael Darby  Current antibiotics: vancomycin, metronidazole, and Rocephin   Changes to prescribed antibiotics recommended:  discontinue vancomycin, change rocephin to ancef 2gm IV q8h Recommendations accepted by provider  Results for orders placed or performed during the hospital encounter of 09/08/21  Blood Culture ID Panel (Reflexed) (Collected: 09/08/2021  5:36 AM)  Result Value Ref Range   Enterococcus faecalis NOT DETECTED NOT DETECTED   Enterococcus Faecium NOT DETECTED NOT DETECTED   Listeria monocytogenes NOT DETECTED NOT DETECTED   Staphylococcus species DETECTED (A) NOT DETECTED   Staphylococcus aureus (BCID) DETECTED (A) NOT DETECTED   Staphylococcus epidermidis NOT DETECTED NOT DETECTED   Staphylococcus lugdunensis NOT DETECTED NOT DETECTED   Streptococcus species NOT DETECTED NOT DETECTED   Streptococcus agalactiae NOT DETECTED NOT DETECTED   Streptococcus pneumoniae NOT DETECTED NOT DETECTED   Streptococcus pyogenes NOT DETECTED NOT DETECTED   A.calcoaceticus-baumannii NOT DETECTED NOT DETECTED   Bacteroides fragilis NOT DETECTED NOT DETECTED   Enterobacterales NOT DETECTED NOT DETECTED   Enterobacter cloacae complex NOT DETECTED NOT DETECTED   Escherichia coli NOT DETECTED NOT DETECTED   Klebsiella aerogenes NOT DETECTED NOT DETECTED   Klebsiella oxytoca NOT DETECTED NOT DETECTED   Klebsiella pneumoniae NOT DETECTED NOT DETECTED   Proteus species NOT DETECTED NOT DETECTED   Salmonella species NOT DETECTED NOT DETECTED   Serratia marcescens NOT DETECTED NOT DETECTED   Haemophilus influenzae NOT DETECTED NOT  DETECTED   Neisseria meningitidis NOT DETECTED NOT DETECTED   Pseudomonas aeruginosa NOT DETECTED NOT DETECTED   Stenotrophomonas maltophilia NOT DETECTED NOT DETECTED   Candida albicans NOT DETECTED NOT DETECTED   Candida auris NOT DETECTED NOT DETECTED   Candida glabrata NOT DETECTED NOT DETECTED   Candida krusei NOT DETECTED NOT DETECTED   Candida parapsilosis NOT DETECTED NOT DETECTED   Candida tropicalis NOT DETECTED NOT DETECTED   Cryptococcus neoformans/gattii NOT DETECTED NOT DETECTED   Meth resistant mecA/C and MREJ NOT DETECTED NOT DETECTED   Harland German, PharmD Clinical Pharmacist **Pharmacist phone directory can now be found on [email or web address] ([email or web address]).  Listed under [facility].

## 2021-09-08 NOTE — ED Provider Notes (Signed)
[facility] EMERGENCY DEPARTMENT Provider Note   CSN: [id number] Arrival date & time: 09/08/21  7425     History Chief Complaint  Patient presents with   Back Pain    Michael Hill is a 70 y.o. male.  HPI     This is a 70 year old male with a history of spondylosis and stenosis status postlaminectomy, hyperlipidemia, hypertension who presents with concerns for drainage from his wound.  Patient had a laminectomy on 08/24/2021 with Dr. Venetia Maxon.  Reports that he thought he was doing fairly well although he does report intermittent back spasms.  His daughter noted some purulent drainage from the incision yesterday and encouraged him to be reevaluated.  He had not noted any fevers but developed a fever while in the emergency department to 100.3.  He has not noted any new numbness or tingling of the lower extremities.  He reports that he has difficulty getting to the bathroom on time and has wet himself several times.  He does not believe this is true incontinence.  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   Lumbar spine scoliosis 08/05/2014   DDD (degenerative disc disease), lumbar 07/23/2014   HYPERCHOLESTEROLEMIA 02/01/2007   RHINITIS, ALLERGIC 02/01/2007   DJD, UNSPECIFIED 02/01/2007   BACK PAIN W/RADIATION, UNSPECIFIED 02/01/2007    Past Surgical History:  Procedure Laterality Date   ABDOMINAL EXPOSURE N/A 08/05/2014   Procedure: ABDOMINAL EXPOSURE;  Surgeon: Chuck Hint, MD;  Location: [facility];  Service: Vascular;  Laterality: N/A;   ANTERIOR LAT LUMBAR FUSION Right 08/05/2014   Procedure: Right Lumbar two-three,Lumbar three-four,Lumbar four-five  Anterior lateral lumbar interbody fusion;  Surgeon: Maeola Harman, MD;  Location: [facility];  Service: Neurosurgery;  Laterality: Right;  right    ANTERIOR LUMBAR FUSION N/A 08/05/2014   Procedure: Lumbar five-Sacral-One Anterior lumbar interbody fusion with Dr. Edilia Bo for approach; Right Lumbar two-three,Lumbar three-four,Lumbar four-five Anterior lateral lumbar interbody fusion;  Surgeon: Maeola Harman, MD;  Location: [facility];  Service: Neurosurgery;  Laterality: N/A;   APPLICATION OF INTRAOPERATIVE CT SCAN N/A 08/24/2021   Procedure: APPLICATION OF INTRAOPERATIVE CT SCAN;  Surgeon: Maeola Harman, MD;  Location: [facility] OR;  Service: Neurosurgery;  Laterality: N/A;   COLONOSCOPY     EPIDURAL BLOCK INJECTION     KNEE ARTHROSCOPY Bilateral    x 4    LUMBAR FUSION  08/05/2014        DR STERN   LUMBAR PERCUTANEOUS PEDICLE SCREW 3 LEVEL N/A 08/07/2014   Procedure: Stage II Lumbar percutaneous pedicle screw placement L2-S1;  Surgeon: Maeola Harman, MD;  Location: [facility];  Service: Neurosurgery;  Laterality: N/A;  Stage II Lumbar percutaneous pedicle screw placement L2-S1   PROSTATE BIOPSY     ROTATOR CUFF REPAIR Right        Family History  Problem Relation Age of Onset   Heart disease Father        before age 37   Hyperlipidemia Father    Hypertension Father    Hypertension Brother    Heart disease Brother        before age  60   Cancer Daughter     Social History   Tobacco Use   Smoking status: Never   Smokeless tobacco: Never  Vaping Use   Vaping Use: Never used  Substance Use Topics   Alcohol use: No   Drug use: No    Home Medications Prior to Admission medications   Medication Sig Start Date End Date Taking? Authorizing Provider  acetaminophen (TYLENOL) 325 MG tablet Take 325 mg by mouth every 6 (six) hours as needed for moderate pain or headache.   Yes [provider]  amLODipine-benazepril (LOTREL) 5-20 MG capsule Take 1 capsule by mouth daily. 06/16/14  Yes  [provider]  atorvastatin (LIPITOR) 20 MG tablet Take 20 mg by mouth daily.   Yes [provider]  methocarbamol (ROBAXIN) 500 MG tablet Take 1 tablet (500 mg total) by mouth every 6 (six) hours as needed for muscle spasms. 08/25/21  Yes Council Mechanic, NP  methylPREDNISolone (MEDROL DOSEPAK) 4 MG TBPK tablet Take 4 mg by mouth See admin instructions. 6,5,4,3,2,1 09/07/21  Yes [provider]  oxyCODONE (OXY IR/ROXICODONE) 5 MG immediate release tablet Take 5 mg by mouth 3 (three) times daily as needed for moderate pain.   Yes [provider]  oxyCODONE (OXYCONTIN) 10 mg 12 hr tablet Take 10 mg by mouth every 12 (twelve) hours.   Yes [provider]  gabapentin (NEURONTIN) 300 MG capsule Take 300 mg by mouth in the morning, at noon, and at bedtime. Take up to 2 capsules by mouth 3 times a day Patient not taking: No sig reported 04/04/16   [provider]  traMADol (ULTRAM) 50 MG tablet Take 50 mg by mouth every 8 (eight) hours as needed for pain. Patient not taking: Reported on 09/08/2021 05/23/16   [provider]    Allergies    Patient has no known allergies.  Review of Systems   Review of Systems  Constitutional:  Positive for fever.  Respiratory:  Negative for shortness of breath.   Cardiovascular:  Negative for chest pain.  Gastrointestinal:  Negative for abdominal pain, nausea and vomiting.  Musculoskeletal:  Positive for back pain.  Skin:  Positive for wound.  All other systems reviewed and are negative.  Physical Exam Updated Vital Signs BP 105/66   Pulse 99   Temp 100.3 F (37.9 C) (Oral)   Resp 17   Ht 1.702 m (5\' 7" )   Wt 95.3 kg   SpO2 95%   BMI 32.89 kg/m   Physical Exam Vitals and nursing note reviewed.  Constitutional:      Appearance: He is well-developed.  HENT:     Head: Normocephalic and atraumatic.     Mouth/Throat:     Mouth: Mucous membranes are dry.  Eyes:     Pupils: Pupils are  equal, round, and reactive to light.  Cardiovascular:     Rate and Rhythm: Normal rate and regular rhythm.     Heart sounds: Normal heart sounds. No murmur heard. Pulmonary:     Effort: Pulmonary effort is normal. No respiratory distress.     Breath sounds: Wheezing present.  Abdominal:     Palpations: Abdomen is soft.     Tenderness: There is no abdominal tenderness.  Musculoskeletal:     Cervical back: Neck supple.  Lymphadenopathy:     Cervical: No cervical adenopathy.  Skin:    General: Skin is warm and dry.     Comments: Surgical wound midline lumbar spine, sutures in  place, spontaneous purulent drainage noted from the mid inferior portion of the wound, no obvious dehiscence, mild adjacent erythema, no fluctuance  Neurological:     Mental Status: He is alert and oriented to person, place, and time.  Psychiatric:        Mood and Affect: Mood normal.    ED Results / Procedures / Treatments   Labs (all labs ordered are listed, but only abnormal results are displayed) Labs Reviewed  CBC WITH DIFFERENTIAL/PLATELET - Abnormal; Notable for the following components:      Result Value   WBC 17.6 (*)    RBC 3.96 (*)    Hemoglobin 11.8 (*)    HCT 36.3 (*)    Neutro Abs 15.9 (*)    Lymphs Abs 0.5 (*)    Abs Immature Granulocytes 0.31 (*)    All other components within normal limits  COMPREHENSIVE METABOLIC PANEL - Abnormal; Notable for the following components:   Sodium 133 (*)    Chloride 97 (*)    Glucose, Bld 150 (*)    BUN 25 (*)    Albumin 3.2 (*)    AST 44 (*)    Total Bilirubin 1.3 (*)    All other components within normal limits  LACTIC ACID, PLASMA - Abnormal; Notable for the following components:   Lactic Acid, Venous 2.2 (*)    All other components within normal limits  LACTIC ACID, PLASMA - Abnormal; Notable for the following components:   Lactic Acid, Venous 2.4 (*)    All other components within normal limits  C-REACTIVE PROTEIN - Abnormal; Notable for the  following components:   CRP 24.3 (*)    All other components within normal limits  RESP PANEL BY RT-PCR (FLU A&B, COVID) [id number]  CULTURE, BLOOD (ROUTINE X 2)  CULTURE, BLOOD (ROUTINE X 2)  SEDIMENTATION RATE    EKG None  Radiology CT LUMBAR SPINE W CONTRAST  Result Date: 09/08/2021 CLINICAL DATA:  70 year old male status post multiple spine surgeries, most recently last month with wound drainage. EXAM: CT LUMBAR SPINE WITH CONTRAST TECHNIQUE: Multidetector CT imaging of the lumbar spine was performed with intravenous contrast administration. CONTRAST:  80mL OMNIPAQUE IOHEXOL 350 MG/ML SOLN COMPARISON:  CT lumbar myelogram 05/10/2021. FINDINGS: Segmentation: Normal, the same numbering system used in June. Alignment: Chronic levoconvex lumbar scoliosis is stable. Stable mild retrolisthesis of L1 on L2 and straightening of the visible lower thoracic kyphosis. Vertebrae: Postoperative details are below. Visible lower thoracic levels appear intact. No acute osseous abnormality identified. Paraspinal and other soft tissues: Patchy and indistinct edema or fluid in the subcutaneous soft tissues posterior to the lumbar spine beginning at the thoracolumbar junction and continuing to the sacrum (series 5, image 109). No rim enhancing or organized collection. No soft tissue gas. Aortoiliac calcified atherosclerosis. Negative visible costophrenic angles and abdominal viscera. Disc levels: T10-T11 and T11-T12: Vacuum disc and disc space loss. No spinal stenosis. T12-L1: Chronic severe disc space loss. Vacuum disc has increased since June. Circumferential disc osteophyte complex eccentric to the left is stable. No significant spinal stenosis. Moderate left T12 foraminal stenosis. L1-L2: Chronic severe disc space loss with increased vacuum disc. Stable mild retrolisthesis. Circumferential disc osteophyte complex with mild to moderate posterior element hypertrophy appears stable. Stable borderline spinal stenosis  suspected, moderate bilateral L1 foraminal stenosis. L2-L3: Chronic posterior and interbody fusion. Stable pedicle screws and interbody implant without loosening. Stable interbody arthrodesis (sagittal image 46). Residual posterior element spurring with no definite posterior element arthrodesis. No stenosis.  L3-L4: Chronic posterior and interbody fusion with stable hardware. Stable interbody and right side posterior element arthrodesis. No significant stenosis. L4-L5: A 2nd set of spinal rods begins at this level, but chronic L4 and L5 pedicle screws and interbody implant here appears stable without loosening. However, no convincing arthrodesis. Moderate facet hypertrophy is stable. The L5 screws traverse the facet joints as before. Chronic endplate spurring. Stable borderline to mild osseous foraminal stenosis. L5-S1: Chronic anterior and posterior fusion hardware at this level with mildly increased lucency along the bilateral S1 pedicle screws since June. Some anterior interbody screw loosening also redemonstrated. No arthrodesis. Moderate to severe residual endplate and facet spurring maximal at the left L5 neural foramen. This level has not significantly changed since June. New bilateral cortical screws traversing the SI joints and extending into the iliac bones appear intact without evidence of loosening. IMPRESSION: 1. Patchy subcutaneous edema and/or fluid throughout the posterior paraspinal soft tissues. No organized or rim enhancing fluid collection, and no soft tissue gas. 2. Chronic L4-L5 and L5-S1 pseudoarthrosis, chronic L5 and S1 hardware loosening. 3. Addition of new bilateral sacroiliac screws and new interlocking posterior spinal rods since June - with no adverse features associated with this hardware. 4. Stable L2 through L4 spinal hardware without loosening, and solid arthrodesis suspected at L2-L3 and L3-L4. 5. Advanced degeneration in the lower thoracic levels and adjacent L1-L2 segment.  Increased vacuum disc since June but otherwise stable. 6.  Aortic Atherosclerosis (ICD10-I70.0). Electronically Signed   By: Odessa Fleming M.D.   On: 09/08/2021 06:03    Procedures Procedures   Medications Ordered in ED Medications  oxyCODONE-acetaminophen (PERCOCET/ROXICET) 5-325 MG per tablet 1 tablet (1 tablet Oral Given 09/08/21 0257)  iohexol (OMNIPAQUE) 350 MG/ML injection 75 mL (75 mLs Intravenous Contrast Given 09/08/21 0519)  0.9 %  sodium chloride infusion ( Intravenous New Bag/Given 09/08/21 0553)    ED Course  I have reviewed the triage vital signs and the nursing notes.  Pertinent labs & imaging results that were available during my care of the patient were reviewed by me and considered in my medical decision making (see chart for details).  Clinical Course as of 09/08/21 0651  Wed Sep 08, 2021  7062 Spoke with Dr. Conchita Paris, neurosurgery.  He advised that Dr. Fredrich Birks NP will be by this morning to look at the patient and advise regarding plan. [CH]    Clinical Course User Index [CH] Kianni Lheureux, Mayer Masker, MD   MDM Rules/Calculators/A&P                            Patient presents with concerns for wound infection.  He is approximately 2 weeks postoperative from a laminectomy.  Noted drainage.  He is now febrile to 100.3.  He is overall not ill-appearing however and does not appear septic.  Wound is not significantly red but does have spontaneous purulent drainage.  Labs reviewed from triage.  Leukocytosis to 17.  I added blood cultures, sed rate, and CRP.  CT imaging obtained.  There is subcutaneous edema and/or fluid without obvious ring-enhancing fluid collection.  Neurosurgery was consulted.  We will hold antibiotics until they are able to evaluate the patient and provide recommendations as he is not septic appearing at this time.    Final Clinical Impression(s) / ED Diagnoses Final diagnoses:  Wound infection after surgery    Rx / DC Orders ED Discharge Orders     None  Shon Baton, MD 09/08/21 [phone]

## 2021-09-08 NOTE — H&P (Signed)
History and Physical    Michael Hill XOV:[id number] DOB: Jan 29, 1951 DOA: 09/08/2021  Referring MD/NP/PA: Lavenia Atlas, DO PCP: Antony Contras, MD  Patient coming from: Home  Chief Complaint: Wound infection  I have personally briefly reviewed patient's old medical records in [place]   HPI: Michael Hill is a 70 y.o. male with medical history significant of hypertension, hyperlipidemia, spondylolysis s/p L5/S1 decompression/fusion/exploration of adjacent adjacent level fusion with extension to pelvis on 08/25/2021 presents with complaints of a wound infection.  After getting discharged home patient reports that he was doing well at home.  Within the last week he had some muscle spasms of his lower back that made it difficult for him to get around.  Due to the difficulty getting around he has had urinary incontinence because of not being able to get to the bathroom in time.  Yesterday he had reportedly been sleeping more than usual by his son. Daughter who is a Marine scientist noticed purulent drainage and some redness from his incision site for which she had concern for the possibility of infection.  His family advised him to come to the emergency department for further evaluation.  In addition to the back spasms patient reports that he has had some mild leg swelling.  He had not noticed any significant fever, confusion, chest pain, shortness of breath, cough, nausea, vomiting, calf pain, saddle anesthesia, or diarrhea symptoms.  He is not normally on oxygen at home, but does breathe through his nose and he states he has had some congestion.  ED Course: Upon admission to the emergency department patient was noted to have temperature up to 100.3 F, pulse 98-1 09, respirations elevated up to 26, blood pressures noted to be as low as 91/44, and O2 saturation maintained on 2 L of nasal cannula oxygen.  Labs significant for WBC 17.6, sodium 133, BUN 25, creatinine 0.95, albumin 3.2, AST 44, CRP 24.3,  ESR 16, and lactic acid 2.2->2.4.  CT scan of the lumbar spine revealed patchy subcutaneous edema with fluid throughout the posterior paraspinal soft tissues without organized fluid collection.  Neurosurgery have been formally consulted.  It was recommended to hold off antibiotics and keeping the patient n.p.o. for likely need of washout later on this afternoon.  COVID-19 screening was positive.  Recommended holding  Review of Systems  Constitutional:  Positive for malaise/fatigue. Negative for fever.  HENT:  Positive for congestion. Negative for ear discharge and nosebleeds.   Eyes:  Negative for double vision and photophobia.  Respiratory:  Negative for cough and shortness of breath.   Cardiovascular:  Positive for leg swelling. Negative for chest pain.  Gastrointestinal:  Negative for abdominal pain, nausea and vomiting.  Genitourinary:  Negative for dysuria and hematuria.  Musculoskeletal:  Positive for myalgias. Negative for falls.  Skin:  Negative for rash.  Neurological:  Negative for focal weakness and loss of consciousness.  Psychiatric/Behavioral:  Negative for memory loss and substance abuse.    Past Medical History:  Diagnosis Date   Arthritis    Chronic back pain    scoliosis/stenosis/spondylosis   History of bronchitis    many yrs ago   History of shingles    Hyperlipidemia    takes Lipitor daily   Hypertension    takes Lotrel daily    Past Surgical History:  Procedure Laterality Date   ABDOMINAL EXPOSURE N/A 08/05/2014   Procedure: ABDOMINAL EXPOSURE;  Surgeon: Angelia Mould, MD;  Location: [facility];  Service: Vascular;  Laterality: N/A;   ANTERIOR LAT LUMBAR FUSION Right 08/05/2014   Procedure: Right Lumbar two-three,Lumbar three-four,Lumbar four-five Anterior lateral lumbar interbody fusion;  Surgeon: Erline Levine, MD;  Location: [facility];  Service: Neurosurgery;  Laterality: Right;  right    ANTERIOR LUMBAR FUSION N/A 08/05/2014   Procedure: Lumbar  five-Sacral-One Anterior lumbar interbody fusion with Dr. Scot Dock for approach; Right Lumbar two-three,Lumbar three-four,Lumbar four-five Anterior lateral lumbar interbody fusion;  Surgeon: Erline Levine, MD;  Location: [facility];  Service: Neurosurgery;  Laterality: N/A;   APPLICATION OF INTRAOPERATIVE CT SCAN N/A 08/24/2021   Procedure: APPLICATION OF INTRAOPERATIVE CT SCAN;  Surgeon: Erline Levine, MD;  Location: [facility];  Service: Neurosurgery;  Laterality: N/A;   COLONOSCOPY     EPIDURAL BLOCK INJECTION     KNEE ARTHROSCOPY Bilateral    x 4    LUMBAR FUSION  08/05/2014        DR STERN   LUMBAR PERCUTANEOUS PEDICLE SCREW 3 LEVEL N/A 08/07/2014   Procedure: Stage II Lumbar percutaneous pedicle screw placement L2-S1;  Surgeon: Erline Levine, MD;  Location: [facility];  Service: Neurosurgery;  Laterality: N/A;  Stage II Lumbar percutaneous pedicle screw placement L2-S1   PROSTATE BIOPSY     ROTATOR CUFF REPAIR Right      reports that he has never smoked. He has never used smokeless tobacco. He reports that he does not drink alcohol and does not use drugs.  No Known Allergies  Family History  Problem Relation Age of Onset   Heart disease Father        before age 3   Hyperlipidemia Father    Hypertension Father    Hypertension Brother    Heart disease Brother        before age 69   Cancer Daughter     Prior to Admission medications   Medication Sig Start Date End Date Taking? Authorizing Provider  acetaminophen (TYLENOL) 325 MG tablet Take 325 mg by mouth every 6 (six) hours as needed for moderate pain or headache.   Yes [provider]  amLODipine-benazepril (LOTREL) 5-20 MG capsule Take 1 capsule by mouth daily. 06/16/14  Yes [provider]  atorvastatin (LIPITOR) 20 MG tablet Take 20 mg by mouth daily.   Yes [provider]  methocarbamol (ROBAXIN) 500 MG tablet Take 1 tablet (500 mg total) by mouth every 6 (six) hours as needed for muscle spasms.  08/25/21  Yes Marvis Moeller, NP  methylPREDNISolone (MEDROL DOSEPAK) 4 MG TBPK tablet Take 4 mg by mouth See admin instructions. 6,5,4,3,2,1 09/07/21  Yes [provider]  oxyCODONE (OXY IR/ROXICODONE) 5 MG immediate release tablet Take 5 mg by mouth 3 (three) times daily as needed for moderate pain.   Yes [provider]  oxyCODONE (OXYCONTIN) 10 mg 12 hr tablet Take 10 mg by mouth every 12 (twelve) hours.   Yes [provider]  gabapentin (NEURONTIN) 300 MG capsule Take 300 mg by mouth in the morning, at noon, and at bedtime. Take up to 2 capsules by mouth 3 times a day Patient not taking: No sig reported 04/04/16   [provider]  traMADol (ULTRAM) 50 MG tablet Take 50 mg by mouth every 8 (eight) hours as needed for pain. Patient not taking: Reported on 09/08/2021 05/23/16   [provider]    Physical Exam:  Constitutional: Elderly male currently no acute distress Vitals:   09/08/21 0715 09/08/21 0745 09/08/21 0830 09/08/21 0930  BP: 110/61 Marland Kitchen)  102/55 96/60 (!) 91/44  Pulse: (!) 105 (!) 104 (!) 102 (!) 107  Resp: 20 (!) 25 (!) 23 (!) 22  Temp:      TempSrc:      SpO2: 96% 94% 92% 94%  Weight:      Height:       Eyes: PERRL, lids and conjunctivae normal ENMT: Mucous membranes are moist. Posterior pharynx clear of any exudate or lesions.  Neck: normal, supple, no masses, no thyromegaly Respiratory: clear to auscultation bilaterally, no wheezing, no crackles. Normal respiratory effort. No accessory muscle use.  Cardiovascular: Regular rate and rhythm, no murmurs / rubs / gallops. No extremity edema. 2+ pedal pulses. No carotid bruits.  Abdomen: no tenderness, no masses palpated. No hepatosplenomegaly. Bowel sounds positive.  Musculoskeletal: no clubbing / cyanosis.  Purulent drainage noted on dressing of lumbar spine postoperative wound and some areas of mild erythema appreciated on the lower aspect. Skin: Edema noted lower aspect of the  lumbar spine. Neurologic: CN 2-12 grossly intact. Sensation intact, DTR normal. Strength 5/5 in all 4.  Psychiatric: Normal judgment and insight. Alert and oriented x 3. Normal mood.     Labs on Admission: I have personally reviewed following labs and imaging studies  CBC: Recent Labs  Lab 09/08/21 0253  WBC 17.6*  NEUTROABS 15.9*  HGB 11.8*  HCT 36.3*  MCV 91.7  PLT 286   Basic Metabolic Panel: Recent Labs  Lab 09/08/21 0253  NA 133*  K 4.2  CL 97*  CO2 26  GLUCOSE 150*  BUN 25*  CREATININE 0.95  CALCIUM 9.1   GFR: Estimated Creatinine Clearance: 80.8 mL/min (by C-G formula based on SCr of 0.95 mg/dL). Liver Function Tests: Recent Labs  Lab 09/08/21 0253  AST 44*  ALT 25  ALKPHOS 75  BILITOT 1.3*  PROT 6.5  ALBUMIN 3.2*   No results for input(s): LIPASE, AMYLASE in the last 168 hours. No results for input(s): AMMONIA in the last 168 hours. Coagulation Profile: No results for input(s): INR, PROTIME in the last 168 hours. Cardiac Enzymes: No results for input(s): CKTOTAL, CKMB, CKMBINDEX, TROPONINI in the last 168 hours. BNP (last 3 results) No results for input(s): PROBNP in the last 8760 hours. HbA1C: No results for input(s): HGBA1C in the last 72 hours. CBG: No results for input(s): GLUCAP in the last 168 hours. Lipid Profile: No results for input(s): CHOL, HDL, LDLCALC, TRIG, CHOLHDL, LDLDIRECT in the last 72 hours. Thyroid Function Tests: No results for input(s): TSH, T4TOTAL, FREET4, T3FREE, THYROIDAB in the last 72 hours. Anemia Panel: No results for input(s): VITAMINB12, FOLATE, FERRITIN, TIBC, IRON, RETICCTPCT in the last 72 hours. Urine analysis: No results found for: COLORURINE, APPEARANCEUR, LABSPEC, PHURINE, GLUCOSEU, HGBUR, BILIRUBINUR, KETONESUR, PROTEINUR, UROBILINOGEN, NITRITE, LEUKOCYTESUR Sepsis Labs: Recent Results (from the past 240 hour(s))  Resp Panel by RT-PCR (Flu A&B, Covid) Nasopharyngeal Swab     Status: Abnormal    Collection Time: 09/08/21  4:53 AM   Specimen: Nasopharyngeal Swab; Nasopharyngeal(NP) swabs in vial transport medium  Result Value Ref Range Status   SARS Coronavirus 2 by RT PCR POSITIVE (A) NEGATIVE Final    Comment: RESULT CALLED TO, READ BACK BY AND VERIFIED WITH: BESSMAN,RN_0  09/08/21 [place] (NOTE) SARS-CoV-2 target nucleic acids are DETECTED.  The SARS-CoV-2 RNA is generally detectable in upper respiratory specimens during the acute phase of infection. Positive results are indicative of the presence of the identified virus, but do not rule out bacterial infection or co-infection with other pathogens not  detected by the test. Clinical correlation with patient history and other diagnostic information is necessary to determine patient infection status. The expected result is Negative.  Fact Sheet for Patients: EntrepreneurPulse.com.au  Fact Sheet for Healthcare Providers: IncredibleEmployment.be  This test is not yet approved or cleared by the [place] FDA and  has been authorized for detection and/or diagnosis of SARS-CoV-2 by FDA under an Emergency Use Authorization (EUA).  This EUA will remain in effect (meaning this test can be used)  for the duration of  the COVID-19 declaration under Section 564(b)(1) of the Act, 21 U.S.C. section 360bbb-3(b)(1), unless the authorization is terminated or revoked sooner.     Influenza A by PCR NEGATIVE NEGATIVE Final   Influenza B by PCR NEGATIVE NEGATIVE Final    Comment: (NOTE) The Xpert Xpress SARS-CoV-2/FLU/RSV plus assay is intended as an aid in the diagnosis of influenza from Nasopharyngeal swab specimens and should not be used as a sole basis for treatment. Nasal washings and aspirates are unacceptable for Xpert Xpress SARS-CoV-2/FLU/RSV testing.  Fact Sheet for Patients: EntrepreneurPulse.com.au  Fact Sheet for Healthcare  Providers: IncredibleEmployment.be  This test is not yet approved or cleared by the [place] FDA and has been authorized for detection and/or diagnosis of SARS-CoV-2 by FDA under an Emergency Use Authorization (EUA). This EUA will remain in effect (meaning this test can be used) for the duration of the COVID-19 declaration under Section 564(b)(1) of the Act, 21 U.S.C. section 360bbb-3(b)(1), unless the authorization is terminated or revoked.  Performed at [facility], [place] [address], [place], [place] [address]      Radiological Exams on Admission: CT LUMBAR SPINE W CONTRAST  Result Date: 09/08/2021 CLINICAL DATA:  70 year old male status post multiple spine surgeries, most recently last month with wound drainage. EXAM: CT LUMBAR SPINE WITH CONTRAST TECHNIQUE: Multidetector CT imaging of the lumbar spine was performed with intravenous contrast administration. CONTRAST:  65m OMNIPAQUE IOHEXOL 350 MG/ML SOLN COMPARISON:  CT lumbar myelogram 05/10/2021. FINDINGS: Segmentation: Normal, the same numbering system used in June. Alignment: Chronic levoconvex lumbar scoliosis is stable. Stable mild retrolisthesis of L1 on L2 and straightening of the visible lower thoracic kyphosis. Vertebrae: Postoperative details are below. Visible lower thoracic levels appear intact. No acute osseous abnormality identified. Paraspinal and other soft tissues: Patchy and indistinct edema or fluid in the subcutaneous soft tissues posterior to the lumbar spine beginning at the thoracolumbar junction and continuing to the sacrum (series 5, image 109). No rim enhancing or organized collection. No soft tissue gas. Aortoiliac calcified atherosclerosis. Negative visible costophrenic angles and abdominal viscera. Disc levels: T10-T11 and T11-T12: Vacuum disc and disc space loss. No spinal stenosis. T12-L1: Chronic severe disc space loss. Vacuum disc has increased since June. Circumferential disc  osteophyte complex eccentric to the left is stable. No significant spinal stenosis. Moderate left T12 foraminal stenosis. L1-L2: Chronic severe disc space loss with increased vacuum disc. Stable mild retrolisthesis. Circumferential disc osteophyte complex with mild to moderate posterior element hypertrophy appears stable. Stable borderline spinal stenosis suspected, moderate bilateral L1 foraminal stenosis. L2-L3: Chronic posterior and interbody fusion. Stable pedicle screws and interbody implant without loosening. Stable interbody arthrodesis (sagittal image 46). Residual posterior element spurring with no definite posterior element arthrodesis. No stenosis. L3-L4: Chronic posterior and interbody fusion with stable hardware. Stable interbody and right side posterior element arthrodesis. No significant stenosis. L4-L5: A 2nd set of spinal rods begins at this level, but chronic L4 and L5 pedicle screws and interbody  implant here appears stable without loosening. However, no convincing arthrodesis. Moderate facet hypertrophy is stable. The L5 screws traverse the facet joints as before. Chronic endplate spurring. Stable borderline to mild osseous foraminal stenosis. L5-S1: Chronic anterior and posterior fusion hardware at this level with mildly increased lucency along the bilateral S1 pedicle screws since June. Some anterior interbody screw loosening also redemonstrated. No arthrodesis. Moderate to severe residual endplate and facet spurring maximal at the left L5 neural foramen. This level has not significantly changed since June. New bilateral cortical screws traversing the SI joints and extending into the iliac bones appear intact without evidence of loosening. IMPRESSION: 1. Patchy subcutaneous edema and/or fluid throughout the posterior paraspinal soft tissues. No organized or rim enhancing fluid collection, and no soft tissue gas. 2. Chronic L4-L5 and L5-S1 pseudoarthrosis, chronic L5 and S1 hardware loosening.  3. Addition of new bilateral sacroiliac screws and new interlocking posterior spinal rods since June - with no adverse features associated with this hardware. 4. Stable L2 through L4 spinal hardware without loosening, and solid arthrodesis suspected at L2-L3 and L3-L4. 5. Advanced degeneration in the lower thoracic levels and adjacent L1-L2 segment. Increased vacuum disc since June but otherwise stable. 6.  Aortic Atherosclerosis (ICD10-I70.0). Electronically Signed   By: Genevie Ann M.D.   On: 09/08/2021 06:03    EKG: Independently reviewed.  Sinus rhythm at 76 bpm with frequent PVCs  Assessment/Plan Sepsis secondary to post operative wound infection: Acute.  Patient presents with complaints of purulent drainage and back spasms from his recent surgical procedure on 9/21 with Dr. Vertell Limber.  CT imaging concerning for patchy subcutaneous edema and or fluid throughout the posterior paraspinal soft tissues with no organized fluid collection appreciated.  Labs significant for CRP 24.3 and sed rate of 60.  Gram stain from the wound did not show any organisms.   Neurosurgery had been formally consulted and plan to take the patient for I&D later on this afternoon. -Admit to progressive -Follow-up blood and wound cultures once able to be obtained -N.p.o. for need of procedure -Start empiric antibiotics of vancomycin, metronidazole, and Rocephin per pharmacy -Tylenol as needed for fever -Continue to trend lactic acid levels -Appreciate neurosurgery consultative services, we will follow-up for any further recommendation  Acute respiratory failure with hypoxia: Normally patient is not on oxygen at baseline, but currently, but currently requiring 4 L of nasal cannula oxygen to maintain O2 saturations greater than 90%.  Given patient's low-grade fever, tachycardia, recent surgery, elevated D-dimer 3.18, and new oxygen requirement question the possibility of a PE.  Other causes include the wound and/or COVID-19  infection -Continuous pulse oximetry with nasal cannula oxygen maintain O2 saturation greater than 92% -Check CT angiogram of the chest  Hypotension: Acute.  On admission blood pressures noted to be as low as 91/44.  Patient is on antihypertensive medications amlodipine benazepril -at baseline, but last took them the other day. -Hold amlodipine-benazepril -Bolus 1 L normal saline IV fluids, and then placed on a rate of 75 mL/h -Goal MAP greater than 65  COVID-19 infection/pneumonia: Patient notes some sinus congestion, but denies any significant cough or shortness of breath.  Inflammatory markers were noted to be elevated but suspect likely related with wound infection.  Chest x-ray concerning for atypical infection versus edema. -COVID-19 focused order set utilized -Follow-up CT angiogram of the chest -Remdesivir per pharmacy -Albuterol inhaler as needed -Decadron 6 mg IV daily -Vitamin C and zinc -Antitussives as needed -Continue monitor inflammatory markers  Hyponatremia:  Acute.  Sodium 133 on admission.  Suspect hypovolemic hyponatremia given elevated BUN to creatinine ratio. -IV fluids as seen above  Postoperative anemia: Hemoglobin prior to surgery had been 13.4, but hemoglobin on admission 11.8 g/dL. -Continue to monitor  Elevated AST: Acute.  AST mildly elevated at 44.  Patient without history of significant alcohol abuse.  Suspect possibly active in the setting of acute infection. -Recheck CMP tomorrow  Hyperlipidemia -Continue pravastatin  Obesity: BMI 32.89 kg/m  DVT prophylaxis: Lovenox Code Status: Full Family Communication: Updated at bedside Disposition Plan: Likely discharge home once medically Consults called: Neurosurgery Admission status: Inpatient require more than 2 midnight stay  Michael Morton MD [facility]   If 7PM-7AM, please contact night-coverage   09/08/2021, 9:33 AM

## 2021-09-08 NOTE — Interval H&P Note (Signed)
History and Physical Interval Note:  09/08/2021 3:14 PM  Michael Hill  has presented today for surgery, with the diagnosis of Wound infection.  The various methods of treatment have been discussed with the patient and family. After consideration of risks, benefits and other options for treatment, the patient has consented to  Procedure(s): Incision and drainage of lumbar wound (N/A) as a surgical intervention.  The patient's history has been reviewed, patient examined, no change in status, stable for surgery.  I have reviewed the patient's chart and labs.  Questions were answered to the patient's satisfaction.     Dorian Heckle

## 2021-09-08 NOTE — Anesthesia Procedure Notes (Signed)
Procedure Name: Intubation Date/Time: 09/08/2021 4:25 PM Performed by: Epifanio Lesches, CRNA Pre-anesthesia Checklist: Patient identified, Emergency Drugs available, Suction available and Patient being monitored Patient Re-evaluated:Patient Re-evaluated prior to induction Oxygen Delivery Method: Circle System Utilized Preoxygenation: Pre-oxygenation with 100% oxygen Induction Type: IV induction and Rapid sequence Laryngoscope Size: Glidescope and 3 Grade View: Grade I Tube type: Oral Tube size: 7.5 mm Number of attempts: 1 Airway Equipment and Method: Stylet and Oral airway Placement Confirmation: ETT inserted through vocal cords under direct vision, positive ETCO2 and breath sounds checked- equal and bilateral Secured at: 22 cm Tube secured with: Tape Dental Injury: Teeth and Oropharynx as per pre-operative assessment  Comments: Elective glidescope intubation d/t covid +

## 2021-09-08 NOTE — Brief Op Note (Signed)
09/08/2021  5:30 PM  PATIENT:  Michael Hill  69 y.o. male  PRE-OPERATIVE DIAGNOSIS:  Wound infection  POST-OPERATIVE DIAGNOSIS:  Wound infection  PROCEDURE:  Procedure(s): Incision and drainage of lumbar wound (N/A)  SURGEON:  Surgeon(s) and Role:    * Tecora Eustache, MD - Primary  PHYSICIAN ASSISTANT:   ASSISTANTS: [place], RN   ANESTHESIA:   general  EBL:  25 mL   BLOOD ADMINISTERED:none  DRAINS: (Medium) Hemovact drain(s) in the epidural space with  Suction Open   LOCAL MEDICATIONS USED:  NONE  SPECIMEN:  Source of Specimen:  Wound culture  DISPOSITION OF SPECIMEN:  Microbiology  COUNTS:  YES  TOURNIQUET:  * No tourniquets in log *  DICTATION: DICTATION:   INDICATIONS:  Patient is 69 year old man who recently underwent columbo-pelvic fusion.  He developed Covid and a draining lumbar wound.  It was elected to take him to surgery for wound I & D.    PROCEDURE: Patient was brought to the OR.  Following induction of anesthesia, he was placed in a prone position on chest rolls.  His back was prepped with betadine scrub and Duraprep.  The wound was explored and was purulent drainage.  There was a fascial defect and the purulence extended to the hardware. The wound was irrigated with saline and vancomycin after cultures were obtained.  The edges were then closed with 1, 0, 2-0 and 3-0 Vicryl sutures after a medium Hemovac drain was placed.  Sterile occlusive dressings were placed with Dermabond and honeycomb dressings.  The patient was extubated in the OR and taken to PACU in stable condition.   PLAN OF CARE: Admit to inpatient   PATIENT DISPOSITION:  PACU - hemodynamically stable.   Delay start of Pharmacological VTE agent (>24hrs) due to surgical blood loss or risk of bleeding: yes  

## 2021-09-08 NOTE — ED Provider Notes (Signed)
Patient signed out to me by previous provider. Please refer to their note for full HPI.  Briefly this is a 70 year old male with recent laminectomy with Dr. Venetia Maxon on 08/24/2021 presenting with lower back spasms and drainage from the incision site.  Patient has 100.3 temperature here, is tachycardic with a white count.  Also noted to be COVID-positive.  Neurosurgery has evaluated the patient and plan to take him to the OR for washout and cultures.  They recommend medical admission and holding antibiotic treatment at this time till OR/washout.  Patients evaluation and results requires admission for further treatment and care. Patient agrees with admission plan, offers no new complaints and is stable/unchanged at time of admit.   Rozelle Logan, [place] 09/08/21 [id number]

## 2021-09-09 ENCOUNTER — Inpatient Hospital Stay (HOSPITAL_COMMUNITY): Payer: Medicare PPO

## 2021-09-09 ENCOUNTER — Other Ambulatory Visit: Payer: Self-pay

## 2021-09-09 ENCOUNTER — Encounter (HOSPITAL_COMMUNITY): Payer: Self-pay | Admitting: Neurosurgery

## 2021-09-09 DIAGNOSIS — A4101 Sepsis due to Methicillin susceptible Staphylococcus aureus: Secondary | ICD-10-CM | POA: Diagnosis present

## 2021-09-09 DIAGNOSIS — E871 Hypo-osmolality and hyponatremia: Secondary | ICD-10-CM | POA: Diagnosis present

## 2021-09-09 DIAGNOSIS — T8149XA Infection following a procedure, other surgical site, initial encounter: Secondary | ICD-10-CM

## 2021-09-09 DIAGNOSIS — I5031 Acute diastolic (congestive) heart failure: Secondary | ICD-10-CM

## 2021-09-09 DIAGNOSIS — R6520 Severe sepsis without septic shock: Secondary | ICD-10-CM | POA: Diagnosis not present

## 2021-09-09 DIAGNOSIS — J9601 Acute respiratory failure with hypoxia: Secondary | ICD-10-CM | POA: Diagnosis not present

## 2021-09-09 LAB — CBC WITH DIFFERENTIAL/PLATELET
Abs Immature Granulocytes: 0 10*3/uL (ref 0.00–0.07)
Basophils Absolute: 0 10*3/uL (ref 0.0–0.1)
Basophils Relative: 0 %
Eosinophils Absolute: 0 10*3/uL (ref 0.0–0.5)
Eosinophils Relative: 0 %
HCT: 29.2 % — ABNORMAL LOW (ref 39.0–52.0)
Hemoglobin: 9.5 g/dL — ABNORMAL LOW (ref 13.0–17.0)
Lymphocytes Relative: 1 %
Lymphs Abs: 0.1 10*3/uL — ABNORMAL LOW (ref 0.7–4.0)
MCH: 29.9 pg (ref 26.0–34.0)
MCHC: 32.5 g/dL (ref 30.0–36.0)
MCV: 91.8 fL (ref 80.0–100.0)
Monocytes Absolute: 0.4 10*3/uL (ref 0.1–1.0)
Monocytes Relative: 5 %
Neutro Abs: 7.1 10*3/uL (ref 1.7–7.7)
Neutrophils Relative %: 94 %
Platelets: 227 10*3/uL (ref 150–400)
RBC: 3.18 MIL/uL — ABNORMAL LOW (ref 4.22–5.81)
RDW: 13.8 % (ref 11.5–15.5)
WBC: 7.5 10*3/uL (ref 4.0–10.5)
nRBC: 0 % (ref 0.0–0.2)
nRBC: 0 /100 WBC

## 2021-09-09 LAB — COMPREHENSIVE METABOLIC PANEL
ALT: 22 U/L (ref 0–44)
AST: 31 U/L (ref 15–41)
Albumin: 2.2 g/dL — ABNORMAL LOW (ref 3.5–5.0)
Alkaline Phosphatase: 56 U/L (ref 38–126)
Anion gap: 9 (ref 5–15)
BUN: 23 mg/dL (ref 8–23)
CO2: 23 mmol/L (ref 22–32)
Calcium: 8.3 mg/dL — ABNORMAL LOW (ref 8.9–10.3)
Chloride: 102 mmol/L (ref 98–111)
Creatinine, Ser: 0.88 mg/dL (ref 0.61–1.24)
GFR, Estimated: >60 mL/min (ref >60)
Glucose, Bld: 155 mg/dL — ABNORMAL HIGH (ref 70–99)
Potassium: 4.1 mmol/L (ref 3.5–5.1)
Sodium: 134 mmol/L — ABNORMAL LOW (ref 135–145)
Total Bilirubin: 0.5 mg/dL (ref 0.3–1.2)
Total Protein: 5 g/dL — ABNORMAL LOW (ref 6.5–8.1)

## 2021-09-09 LAB — ECHOCARDIOGRAM COMPLETE
Height: 67 in
S' Lateral: 3.3 cm
Weight: 3442.7 oz

## 2021-09-09 LAB — PHOSPHORUS: Phosphorus: 2.5 mg/dL (ref 2.5–4.6)

## 2021-09-09 LAB — D-DIMER, QUANTITATIVE: D-Dimer, Quant: 3.06 ug/mL-FEU — ABNORMAL HIGH (ref 0.00–0.50)

## 2021-09-09 LAB — FERRITIN: Ferritin: 245 ng/mL (ref 24–336)

## 2021-09-09 LAB — C-REACTIVE PROTEIN: CRP: 30 mg/dL — ABNORMAL HIGH (ref <1.0)

## 2021-09-09 LAB — MAGNESIUM: Magnesium: 1.7 mg/dL (ref 1.7–2.4)

## 2021-09-09 MED ORDER — ONDANSETRON HCL 4 MG PO TABS: 4.0000 mg | ORAL_TABLET | Freq: Four times a day (QID) | ORAL | Status: DC | PRN

## 2021-09-09 MED ORDER — PANTOPRAZOLE SODIUM 40 MG IV SOLR
40.0000 mg | Freq: Every day | INTRAVENOUS | Status: DC
  Administered 2021-09-09: 40 mg via INTRAVENOUS
  Filled 2021-09-09: qty 40

## 2021-09-09 MED ORDER — POLYETHYLENE GLYCOL 3350 17 G PO PACK: 17.0000 g | PACK | Freq: Every day | ORAL | Status: DC | PRN

## 2021-09-09 MED ORDER — METHOCARBAMOL 1000 MG/10ML IJ SOLN
500.0000 mg | Freq: Four times a day (QID) | INTRAVENOUS | Status: DC | PRN
  Filled 2021-09-09 (×3): qty 5

## 2021-09-09 MED ORDER — ONDANSETRON HCL 4 MG/2ML IJ SOLN: 4.0000 mg | Freq: Four times a day (QID) | INTRAMUSCULAR | Status: DC | PRN

## 2021-09-09 MED ORDER — METHOCARBAMOL 500 MG PO TABS: 500.0000 mg | ORAL_TABLET | Freq: Four times a day (QID) | ORAL | Status: DC | PRN

## 2021-09-09 MED ORDER — SODIUM CHLORIDE 0.9 % IV SOLN: 250.0000 mL | INTRAVENOUS | Status: DC

## 2021-09-09 MED ORDER — CEFAZOLIN SODIUM-DEXTROSE 2-4 GM/100ML-% IV SOLN
2.0000 g | Freq: Three times a day (TID) | INTRAVENOUS | Status: DC
Start: 2021-09-09 — End: 2021-09-09

## 2021-09-09 MED ORDER — OXYCODONE HCL 5 MG PO TABS
5.0000 mg | ORAL_TABLET | ORAL | Status: DC | PRN
  Administered 2021-09-09 – 2021-09-14 (×31): 5 mg via ORAL
  Filled 2021-09-09 (×31): qty 1

## 2021-09-09 MED ORDER — HYDROCODONE-ACETAMINOPHEN 5-325 MG PO TABS: 2.0000 | ORAL_TABLET | ORAL | Status: DC | PRN

## 2021-09-09 MED ORDER — PHENOL 1.4 % MT LIQD: 1.0000 | OROMUCOSAL | Status: DC | PRN

## 2021-09-09 MED ORDER — PANTOPRAZOLE SODIUM 40 MG PO TBEC
40.0000 mg | DELAYED_RELEASE_TABLET | Freq: Every day | ORAL | Status: DC
Start: 2021-09-10 — End: 2021-09-15
  Administered 2021-09-10 – 2021-09-14 (×5): 40 mg via ORAL
  Filled 2021-09-09 (×5): qty 1

## 2021-09-09 MED ORDER — CALCIUM CARBONATE ANTACID 500 MG PO CHEW
1.0000 | CHEWABLE_TABLET | Freq: Two times a day (BID) | ORAL | Status: DC | PRN
  Administered 2021-09-09: 200 mg via ORAL
  Filled 2021-09-09: qty 1

## 2021-09-09 MED ORDER — ALUM & MAG HYDROXIDE-SIMETH 200-200-20 MG/5ML PO SUSP: 30.0000 mL | Freq: Four times a day (QID) | ORAL | Status: DC | PRN

## 2021-09-09 MED ORDER — DOCUSATE SODIUM 100 MG PO CAPS
100.0000 mg | ORAL_CAPSULE | Freq: Two times a day (BID) | ORAL | Status: DC
  Administered 2021-09-09 – 2021-09-12 (×6): 100 mg via ORAL
  Filled 2021-09-09 (×7): qty 1

## 2021-09-09 MED ORDER — METHOCARBAMOL 500 MG PO TABS
500.0000 mg | ORAL_TABLET | Freq: Four times a day (QID) | ORAL | Status: DC | PRN
  Administered 2021-09-09 – 2021-09-13 (×10): 500 mg via ORAL
  Filled 2021-09-09 (×10): qty 1

## 2021-09-09 MED ORDER — BISACODYL 10 MG RE SUPP: 10.0000 mg | Freq: Every day | RECTAL | Status: DC | PRN

## 2021-09-09 MED ORDER — ACETAMINOPHEN 650 MG RE SUPP: 650.0000 mg | RECTAL | Status: DC | PRN

## 2021-09-09 MED ORDER — ACETAMINOPHEN 325 MG PO TABS
650.0000 mg | ORAL_TABLET | ORAL | Status: DC | PRN
  Administered 2021-09-09 (×2): 650 mg via ORAL
  Filled 2021-09-09 (×2): qty 2

## 2021-09-09 MED ORDER — KCL IN DEXTROSE-NACL 20-5-0.45 MEQ/L-%-% IV SOLN
INTRAVENOUS | Status: DC
  Filled 2021-09-09: qty 1000

## 2021-09-09 MED ORDER — FLEET ENEMA 7-19 GM/118ML RE ENEM: 1.0000 | ENEMA | Freq: Once | RECTAL | Status: DC | PRN

## 2021-09-09 MED ORDER — HYDROMORPHONE HCL 1 MG/ML IJ SOLN: 0.5000 mg | INTRAMUSCULAR | Status: DC | PRN

## 2021-09-09 MED ORDER — KETOROLAC TROMETHAMINE 15 MG/ML IJ SOLN
7.5000 mg | Freq: Four times a day (QID) | INTRAMUSCULAR | Status: AC
  Administered 2021-09-09 (×4): 7.5 mg via INTRAVENOUS
  Filled 2021-09-09 (×4): qty 1

## 2021-09-09 MED ORDER — SODIUM CHLORIDE 0.9% FLUSH: 3.0000 mL | Freq: Two times a day (BID) | INTRAVENOUS | Status: DC

## 2021-09-09 MED ORDER — MENTHOL 3 MG MT LOZG: 1.0000 | LOZENGE | OROMUCOSAL | Status: DC | PRN

## 2021-09-09 MED ORDER — ZOLPIDEM TARTRATE 5 MG PO TABS
5.0000 mg | ORAL_TABLET | Freq: Every evening | ORAL | Status: DC | PRN
  Filled 2021-09-09: qty 1

## 2021-09-09 MED ORDER — SODIUM CHLORIDE 0.9% FLUSH: 3.0000 mL | INTRAVENOUS | Status: DC | PRN

## 2021-09-09 NOTE — Assessment & Plan Note (Addendum)
Inflammatory markers are improving. Continue remdesivir and Decadron. See acute hypoxic respiratory failure.

## 2021-09-09 NOTE — Assessment & Plan Note (Signed)
Likely secondary to infection.  Potentially demand ischemia as well.  Monitor.

## 2021-09-09 NOTE — Anesthesia Postprocedure Evaluation (Signed)
Anesthesia Post Note  Patient: Michael Hill  Procedure(s) Performed: Incision and drainage of lumbar wound (Spine Lumbar)     Patient location during evaluation: PACU Anesthesia Type: General Level of consciousness: awake and alert Pain management: pain level controlled Vital Signs Assessment: post-procedure vital signs reviewed and stable Respiratory status: spontaneous breathing, nonlabored ventilation, respiratory function stable and patient connected to nasal cannula oxygen Cardiovascular status: blood pressure returned to baseline and stable Postop Assessment: no apparent nausea or vomiting Anesthetic complications: no   No notable events documented.  Last Vitals:  Vitals:   09/09/21 0747 09/09/21 1100  BP: 117/71 120/69  Pulse: 84 97  Resp: 14 16  Temp: 36.4 C 36.8 C  SpO2: 94% 90%    Last Pain:  Vitals:   09/09/21 1100  TempSrc: Oral  PainSc:                  Kennieth Rad

## 2021-09-09 NOTE — Assessment & Plan Note (Addendum)
Placing the patient at high risk for poor outcome in the setting of COVID infection as well as wound infection.  Monitor. Body mass index is 33.7 kg/m.

## 2021-09-09 NOTE — Assessment & Plan Note (Signed)
On Lipitor.  Continue.

## 2021-09-09 NOTE — Assessment & Plan Note (Addendum)
Initially on 4 L of oxygen Saturating 90% 10/6.  Does not use oxygen at baseline. Currently on room air 94% Likely secondary to COVID infection as well as mild volume overload.. CT PE protocol negative for pulmonary embolism. Completed remdesivir 10/9.  On Decadron.  Continue for 5 more days.

## 2021-09-09 NOTE — Consult Note (Signed)
[facility]  Total days of antibiotics 2              Reason for Consult:MSSA bacteremia    Referring Physician: Rolly Salter, MD  Principal Problem:   Sepsis [facility]) Active Problems:   HYPERCHOLESTEROLEMIA   Postoperative wound infection   Acute respiratory failure with hypoxia (HCC)   COVID-19 virus infection   Hypotension   Postoperative anemia   Elevated AST (SGOT)   Obesity (BMI 30.0-34.9)    HPI: Michael Hill is a 70 y.o. male with HTN, HLD, chronic back pain s/p L5-S1 decompression surgery on 08/25/21 who presented to [facility] following a back spasm and wound infection. He states that he has been having muscle spasms of his low back, and his daughter, who is a Engineer, civil (consulting), noticed purulent drainage and erythema around the surgical site and encouraged him to go the the hospital for further evaluation. He denies any fevers, chills, chest pain, shortness of breath, n/v/d, saddle anesthesia, or any other sxs at this time.  The patient was taken to the OR on 10/5 for I&D of his draining lumbar wound. Wound was explored and purulence was noted to extend to the hardware. The wound was irrigated with saline and vanc after cultures were obtained. Wound cultures are growing gram positive cocci and the patient was also noted to have MSSA growing in his blood cultures.   Past Medical History:  Diagnosis Date   Arthritis    Chronic back pain    scoliosis/stenosis/spondylosis   History of bronchitis    many yrs ago   History of shingles    Hyperlipidemia    takes Lipitor daily   Hypertension    takes Lotrel daily    Allergies: No Known Allergies  Current antibiotics: Ancef    MEDICATIONS:  vitamin C  500 mg Oral Daily   atorvastatin  20 mg Oral Daily   dexamethasone (DECADRON) injection  6 mg Intravenous Q24H   docusate sodium  100 mg Oral BID   enoxaparin (LOVENOX) injection  40 mg Subcutaneous Q24H   famotidine  20 mg Oral Daily   ketorolac   7.5 mg Intravenous Q6H   oxyCODONE  10 mg Oral Q12H   pantoprazole (PROTONIX) IV  40 mg Intravenous Daily   zinc sulfate  220 mg Oral Daily    Social History   Tobacco Use   Smoking status: Never   Smokeless tobacco: Never  Vaping Use   Vaping Use: Never used  Substance Use Topics   Alcohol use: No   Drug use: No    Family History  Problem Relation Age of Onset   Heart disease Father        before age 85   Hyperlipidemia Father    Hypertension Father    Hypertension Brother    Heart disease Brother        before age 71   Cancer Daughter     Review of Systems -  Review of Systems  Constitutional:  Negative for chills and fever.  HENT: Negative.    Respiratory:  Positive for shortness of breath. Negative for cough and sputum production.   Cardiovascular:  Negative for chest pain and palpitations.  Gastrointestinal:  Negative for diarrhea, nausea and vomiting.  Genitourinary: Negative.   Musculoskeletal:  Positive for back pain and myalgias.  Neurological:  Negative for dizziness and headaches.     OBJECTIVE: Temp:  [97.6 F (36.4 C)-101.7 F (38.7 C)] 97.6  F (36.4 C) (10/06 0747) Pulse Rate:  [81-120] 84 (10/06 0747) Resp:  [14-22] 14 (10/06 0747) BP: (77-121)/(48-83) 117/71 (10/06 0747) SpO2:  [92 %-96 %] 94 % (10/06 0747) Weight:  [97.6 kg] 97.6 kg (10/05 2100)  General: No acute distress. CV: RRR. No murmurs, rubs, or gallops. No LE edema Pulmonary: Lungs CTAB. Normal effort. No wheezing or rales. Abdominal: Soft, nontender, nondistended. Normal bowel sounds. Extremities/MSK: Palpable radial and DP pulses. Normal ROM. Lumbar wound is dressed and no surrounding erythema/warmth/drainage noted. Surgical drain intact. Skin: Warm and dry. No obvious rash or lesions. Neuro: A&Ox3. Moves all extremities. Normal sensation. No focal deficit.   LABS: Results for orders placed or performed during the hospital encounter of 09/08/21 (from the past 48 hour(s))   CBC with Differential     Status: Abnormal   Collection Time: 09/08/21  2:53 AM  Result Value Ref Range   WBC 17.6 (H) 4.0 - 10.5 K/uL   RBC 3.96 (L) 4.22 - 5.81 MIL/uL   Hemoglobin 11.8 (L) 13.0 - 17.0 g/dL   HCT 01.0 (L) 27.2 - 53.6 %   MCV 91.7 80.0 - 100.0 fL   MCH 29.8 26.0 - 34.0 pg   MCHC 32.5 30.0 - 36.0 g/dL   RDW 64.4 03.4 - 74.2 %   Platelets 343 150 - 400 K/uL   nRBC 0.0 0.0 - 0.2 %   Neutrophils Relative % 90 %   Neutro Abs 15.9 (H) 1.7 - 7.7 K/uL   Lymphocytes Relative 3 %   Lymphs Abs 0.5 (L) 0.7 - 4.0 K/uL   Monocytes Relative 5 %   Monocytes Absolute 0.9 0.1 - 1.0 K/uL   Eosinophils Relative 0 %   Eosinophils Absolute 0.0 0.0 - 0.5 K/uL   Basophils Relative 0 %   Basophils Absolute 0.0 0.0 - 0.1 K/uL   Immature Granulocytes 2 %   Abs Immature Granulocytes 0.31 (H) 0.00 - 0.07 K/uL    Comment: Performed at [facility], [address] [place], [place] [address]  Comprehensive metabolic panel     Status: Abnormal   Collection Time: 09/08/21  2:53 AM  Result Value Ref Range   Sodium 133 (L) 135 - 145 mmol/L   Potassium 4.2 3.5 - 5.1 mmol/L   Chloride 97 (L) 98 - 111 mmol/L   CO2 26 22 - 32 mmol/L   Glucose, Bld 150 (H) 70 - 99 mg/dL    Comment: Glucose reference range applies only to samples taken after fasting for at least 8 hours.   BUN 25 (H) 8 - 23 mg/dL   Creatinine, Ser 8.75 0.61 - 1.24 mg/dL   Calcium 9.1 8.9 - 64.3 mg/dL   Total Protein 6.5 6.5 - 8.1 g/dL   Albumin 3.2 (L) 3.5 - 5.0 g/dL   AST 44 (H) 15 - 41 U/L   ALT 25 0 - 44 U/L   Alkaline Phosphatase 75 38 - 126 U/L   Total Bilirubin 1.3 (H) 0.3 - 1.2 mg/dL   GFR, Estimated >32 >95 mL/min    Comment: (NOTE) Calculated using the CKD-EPI Creatinine Equation (2021)    Anion gap 10 5 - 15    Comment: Performed at [facility], [address], [place], [place] [address]  Lactic acid, plasma     Status: Abnormal   Collection Time: 09/08/21  2:53 AM  Result Value Ref Range    Lactic Acid, Venous 2.2 (HH) 0.5 - 1.9 mmol/L    Comment:  CRITICAL RESULT CALLED TO, READ BACK BY AND VERIFIED WITH: JESSICA [facility] RN 09/08/21 0421 [place] Performed at [facility], [address] [place], [place] [address]   Resp Panel by RT-PCR (Flu A&B, Covid) Nasopharyngeal Swab     Status: Abnormal   Collection Time: 09/08/21  4:53 AM   Specimen: Nasopharyngeal Swab; Nasopharyngeal(NP) swabs in vial transport medium  Result Value Ref Range   SARS Coronavirus 2 by RT PCR POSITIVE (A) NEGATIVE    Comment: RESULT CALLED TO, READ BACK BY AND VERIFIED WITH: BESSMAN,RN@0712  09/08/21 MK (NOTE) SARS-CoV-2 target nucleic acids are DETECTED.  The SARS-CoV-2 RNA is generally detectable in upper respiratory specimens during the acute phase of infection. Positive results are indicative of the presence of the identified virus, but do not rule out bacterial infection or co-infection with other pathogens not detected by the test. Clinical correlation with patient history and other diagnostic information is necessary to determine patient infection status. The expected result is Negative.  Fact Sheet for Patients: BloggerCourse.com  Fact Sheet for Healthcare Providers: SeriousBroker.it  This test is not yet approved or cleared by the [place] FDA and  has been authorized for detection and/or diagnosis of SARS-CoV-2 by FDA under an Emergency Use Authorization (EUA).  This EUA will remain in effect (meaning this test can be used)  for the duration of  the COVID-19 declaration under Section 564(b)(1) of the Act, 21 U.S.C. section 360bbb-3(b)(1), unless the authorization is terminated or revoked sooner.     Influenza A by PCR NEGATIVE NEGATIVE   Influenza B by PCR NEGATIVE NEGATIVE    Comment: (NOTE) The Xpert Xpress SARS-CoV-2/FLU/RSV plus assay is intended as an aid in the diagnosis of influenza from Nasopharyngeal swab  specimens and should not be used as a sole basis for treatment. Nasal washings and aspirates are unacceptable for Xpert Xpress SARS-CoV-2/FLU/RSV testing.  Fact Sheet for Patients: BloggerCourse.com  Fact Sheet for Healthcare Providers: SeriousBroker.it  This test is not yet approved or cleared by the [place] FDA and has been authorized for detection and/or diagnosis of SARS-CoV-2 by FDA under an Emergency Use Authorization (EUA). This EUA will remain in effect (meaning this test can be used) for the duration of the COVID-19 declaration under Section 564(b)(1) of the Act, 21 U.S.C. section 360bbb-3(b)(1), unless the authorization is terminated or revoked.  Performed at [facility], [address], [place], [place] [address]   Lactic acid, plasma     Status: Abnormal   Collection Time: 09/08/21  5:18 AM  Result Value Ref Range   Lactic Acid, Venous 2.4 (HH) 0.5 - 1.9 mmol/L    Comment: CRITICAL VALUE NOTED.  VALUE IS CONSISTENT WITH PREVIOUSLY REPORTED AND CALLED VALUE. Performed at [facility], [address] [place], [place] [address]   Sedimentation rate     Status: Abnormal   Collection Time: 09/08/21  5:25 AM  Result Value Ref Range   Sed Rate 60 (H) 0 - 16 mm/hr    Comment: Performed at [facility], [address], [place], [place] [address]  C-reactive protein     Status: Abnormal   Collection Time: 09/08/21  5:25 AM  Result Value Ref Range   CRP 24.3 (H) <1.0 mg/dL    Comment: Performed at [facility], [address], [place], [place] [address]  Blood culture (routine x 2)     Status: Abnormal (Preliminary result)   Collection Time: 09/08/21  5:36  AM   Specimen: BLOOD  Result Value Ref Range   Specimen Description BLOOD LEFT ANTECUBITAL    Special Requests      BOTTLES DRAWN AEROBIC AND ANAEROBIC Blood Culture adequate volume   Culture  Setup Time      GRAM POSITIVE COCCI IN  CLUSTERS IN BOTH AEROBIC AND ANAEROBIC BOTTLES CRITICAL RESULT CALLED TO, READ BACK BY AND VERIFIED WITH: PHARMD ANDREW MAYER 10/5/2 @1843  BY JW    Culture (A)     STAPHYLOCOCCUS AUREUS SUSCEPTIBILITIES TO FOLLOW Performed at [facility], [address] [place], [place]    Report Status PENDING   Blood Culture ID Panel (Reflexed)     Status: Abnormal   Collection Time: 09/08/21  5:36 AM  Result Value Ref Range   Enterococcus faecalis NOT DETECTED NOT DETECTED   Enterococcus Faecium NOT DETECTED NOT DETECTED   Listeria monocytogenes NOT DETECTED NOT DETECTED   Staphylococcus species DETECTED (A) NOT DETECTED    Comment: CRITICAL RESULT CALLED TO, READ BACK BY AND VERIFIED WITH: PHARMD ANDREW MAYER 10/5/2 @1843  BY JW    Staphylococcus aureus (BCID) DETECTED (A) NOT DETECTED    Comment: CRITICAL RESULT CALLED TO, READ BACK BY AND VERIFIED WITH: PHARMD ANDREW MAYER 10/5/2 @1843  BY JW    Staphylococcus epidermidis NOT DETECTED NOT DETECTED   Staphylococcus lugdunensis NOT DETECTED NOT DETECTED   Streptococcus species NOT DETECTED NOT DETECTED   Streptococcus agalactiae NOT DETECTED NOT DETECTED   Streptococcus pneumoniae NOT DETECTED NOT DETECTED   Streptococcus pyogenes NOT DETECTED NOT DETECTED   A.calcoaceticus-baumannii NOT DETECTED NOT DETECTED   Bacteroides fragilis NOT DETECTED NOT DETECTED   Enterobacterales NOT DETECTED NOT DETECTED   Enterobacter cloacae complex NOT DETECTED NOT DETECTED   Escherichia coli NOT DETECTED NOT DETECTED   Klebsiella aerogenes NOT DETECTED NOT DETECTED   Klebsiella oxytoca NOT DETECTED NOT DETECTED   Klebsiella pneumoniae NOT DETECTED NOT DETECTED   Proteus species NOT DETECTED NOT DETECTED   Salmonella species NOT DETECTED NOT DETECTED   Serratia marcescens NOT DETECTED NOT DETECTED   Haemophilus influenzae NOT DETECTED NOT DETECTED   Neisseria meningitidis NOT DETECTED NOT DETECTED   Pseudomonas aeruginosa NOT DETECTED  NOT DETECTED   Stenotrophomonas maltophilia NOT DETECTED NOT DETECTED   Candida albicans NOT DETECTED NOT DETECTED   Candida auris NOT DETECTED NOT DETECTED   Candida glabrata NOT DETECTED NOT DETECTED   Candida krusei NOT DETECTED NOT DETECTED   Candida parapsilosis NOT DETECTED NOT DETECTED   Candida tropicalis NOT DETECTED NOT DETECTED   Cryptococcus neoformans/gattii NOT DETECTED NOT DETECTED   Meth resistant mecA/C and MREJ NOT DETECTED NOT DETECTED    Comment: Performed at [facility], [address] [place], [facility] [address]  Blood culture (routine x 2)     Status: Abnormal (Preliminary result)   Collection Time: 09/08/21  5:41 AM   Specimen: BLOOD  Result Value Ref Range   Specimen Description BLOOD RIGHT ANTECUBITAL    Special Requests      BOTTLES DRAWN AEROBIC AND ANAEROBIC Blood Culture adequate volume   Culture  Setup Time      GRAM POSITIVE COCCI IN CLUSTERS IN BOTH AEROBIC AND ANAEROBIC BOTTLES CRITICAL VALUE NOTED.  VALUE IS CONSISTENT WITH PREVIOUSLY REPORTED AND CALLED VALUE. Performed at [facility], [address] [place], [facility] [address]    Culture STAPHYLOCOCCUS AUREUS (A)    Report Status PENDING   Brain natriuretic peptide     Status:  Abnormal   Collection Time: 09/08/21 10:02 AM  Result Value Ref Range   B Natriuretic Peptide 503.3 (H) 0.0 - 100.0 pg/mL    Comment: Performed at [facility], [address], [place], [place] [address]  HIV Antibody (routine testing w rflx)     Status: None   Collection Time: 09/08/21 10:45 AM  Result Value Ref Range   HIV Screen 4th Generation wRfx Non Reactive Non Reactive    Comment: Performed at [facility], [address], [place], [place] [address]  Procalcitonin     Status: None   Collection Time: 09/08/21 10:45 AM  Result Value Ref Range   Procalcitonin 5.23 ng/mL    Comment:        Interpretation: PCT > 2 ng/mL: Systemic infection (sepsis) is likely, unless other causes are  known. (NOTE)       Sepsis PCT Algorithm           Lower Respiratory Tract                                      Infection PCT Algorithm    ----------------------------     ----------------------------         PCT < 0.25 ng/mL                PCT < 0.10 ng/mL          Strongly encourage             Strongly discourage   discontinuation of antibiotics    initiation of antibiotics    ----------------------------     -----------------------------       PCT 0.25 - 0.50 ng/mL            PCT 0.10 - 0.25 ng/mL               OR       >80% decrease in PCT            Discourage initiation of                                            antibiotics      Encourage discontinuation           of antibiotics    ----------------------------     -----------------------------         PCT >= 0.50 ng/mL              PCT 0.26 - 0.50 ng/mL               AND       <80% decrease in PCT              Encourage initiation of                                             antibiotics       Encourage continuation           of antibiotics    ----------------------------     -----------------------------        PCT >= 0.50 ng/mL  PCT > 0.50 ng/mL               AND         increase in PCT                  Strongly encourage                                      initiation of antibiotics    Strongly encourage escalation           of antibiotics                                     -----------------------------                                           PCT <= 0.25 ng/mL                                                 OR                                        > 80% decrease in PCT                                      Discontinue / Do not initiate                                             antibiotics  Performed at [facility], [address] [place], [place] [address]   Lactate dehydrogenase     Status: Abnormal   Collection Time: 09/08/21 10:45 AM  Result Value Ref Range   LDH 207 (H) 98 -  192 U/L    Comment: Performed at [facility], [address] [place], [place] [address]  Hepatitis B surface antigen     Status: None   Collection Time: 09/08/21 10:45 AM  Result Value Ref Range   Hepatitis B Surface Ag NON REACTIVE NON REACTIVE    Comment: Performed at [facility], [address], [place], [place] [address]  Fibrinogen     Status: Abnormal   Collection Time: 09/08/21 10:45 AM  Result Value Ref Range   Fibrinogen 726 (H) 210 - 475 mg/dL    Comment: (NOTE) Fibrinogen results may be underestimated in patients receiving thrombolytic therapy. Performed at [facility], [address] [place], [place] [address]   Ferritin     Status: None   Collection Time: 09/08/21 10:45 AM  Result Value Ref Range   Ferritin 135 24 - 336 ng/mL    Comment: Performed at [facility], [address] [place], [place] [address]  D-dimer, quantitative     Status: Abnormal   Collection Time: 09/08/21  10:45 AM  Result Value Ref Range   D-Dimer, Quant 3.18 (H) 0.00 - 0.50 ug/mL-FEU    Comment: (NOTE) At the manufacturer cut-off value of 0.5 g/mL FEU, this assay has a negative predictive value of 95-100%.This assay is intended for use in conjunction with a clinical pretest probability (PTP) assessment model to exclude pulmonary embolism (PE) and deep venous thrombosis (DVT) in outpatients suspected of PE or DVT. Results should be correlated with clinical presentation. Performed at [facility], [address] [place], [place] [address]   Aerobic/Anaerobic Culture w Gram Stain (surgical/deep wound)     Status: Abnormal (Preliminary result)   Collection Time: 09/08/21 12:25 PM   Specimen: Wound  Result Value Ref Range   Specimen Description WOUND    Special Requests  UKN    Gram Stain NO WBC SEEN NO ORGANISMS SEEN     Culture (A)     STAPHYLOCOCCUS AUREUS SUSCEPTIBILITIES TO FOLLOW Performed at [facility], [address] [place],  [place] [address]    Report Status PENDING   Aerobic/Anaerobic Culture w Gram Stain (surgical/deep wound)     Status: None (Preliminary result)   Collection Time: 09/08/21  4:44 PM   Specimen: Wound  Result Value Ref Range   Specimen Description WOUND    Special Requests LUMBAR NO 1    Gram Stain      NO ORGANISMS SEEN SQUAMOUS EPITHELIAL CELLS PRESENT FEW WBC PRESENT, PREDOMINANTLY MONONUCLEAR MODERATE GRAM POSITIVE COCCI Performed at [facility], [address] [place], [place] [address]    Culture PENDING    Report Status PENDING   Aerobic/Anaerobic Culture w Gram Stain (surgical/deep wound)     Status: None (Preliminary result)   Collection Time: 09/08/21  4:57 PM   Specimen: Wound  Result Value Ref Range   Specimen Description WOUND    Special Requests LUMBAR NO 2    Gram Stain      NO ORGANISMS SEEN SQUAMOUS EPITHELIAL CELLS PRESENT FEW WBC PRESENT, PREDOMINANTLY MONONUCLEAR ABUNDANT GRAM POSITIVE COCCI Performed at [facility], [address], [place], [place] [address]    Culture PENDING    Report Status PENDING   Lactic acid, plasma     Status: None   Collection Time: 09/08/21  9:41 PM  Result Value Ref Range   Lactic Acid, Venous 1.2 0.5 - 1.9 mmol/L    Comment: Performed at [facility], [address] [place], [place] [address]  Hemoglobin and hematocrit, blood     Status: Abnormal   Collection Time: 09/08/21  9:41 PM  Result Value Ref Range   Hemoglobin 9.9 (L) 13.0 - 17.0 g/dL   HCT 33.3 (L) 54.5 - 62.5 %    Comment: Performed at [facility], [address] [place], [place] [address]  Type and screen [facility]     Status: None   Collection Time: 09/08/21  9:41 PM  Result Value Ref Range   ABO/RH(D) A POS    Antibody Screen NEG    Sample Expiration      09/11/2021,2359 Performed at [facility], [address] [place], [place] [address]   Phosphorus     Status: None   Collection Time: 09/09/21  2:54 AM   Result Value Ref Range   Phosphorus 2.5 2.5 - 4.6 mg/dL    Comment: Performed at [facility], [address], [place], [place] [address]  Magnesium  Status: None   Collection Time: 09/09/21  2:54 AM  Result Value Ref Range   Magnesium 1.7 1.7 - 2.4 mg/dL    Comment: Performed at [facility], [address] [place], [place] [address]  Ferritin     Status: None   Collection Time: 09/09/21  2:54 AM  Result Value Ref Range   Ferritin 245 24 - 336 ng/mL    Comment: Performed at [facility], [address] [place], [place] [address]  D-dimer, quantitative     Status: Abnormal   Collection Time: 09/09/21  2:54 AM  Result Value Ref Range   D-Dimer, Quant 3.06 (H) 0.00 - 0.50 ug/mL-FEU    Comment: (NOTE) At the manufacturer cut-off value of 0.5 g/mL FEU, this assay has a negative predictive value of 95-100%.This assay is intended for use in conjunction with a clinical pretest probability (PTP) assessment model to exclude pulmonary embolism (PE) and deep venous thrombosis (DVT) in outpatients suspected of PE or DVT. Results should be correlated with clinical presentation. Performed at [facility], [address] [place], [place] [address]   C-reactive protein     Status: Abnormal   Collection Time: 09/09/21  2:54 AM  Result Value Ref Range   CRP 30.0 (H) <1.0 mg/dL    Comment: Performed at [facility], [address], [place], [place] [address]  Comprehensive metabolic panel     Status: Abnormal   Collection Time: 09/09/21  2:54 AM  Result Value Ref Range   Sodium 134 (L) 135 - 145 mmol/L   Potassium 4.1 3.5 - 5.1 mmol/L   Chloride 102 98 - 111 mmol/L   CO2 23 22 - 32 mmol/L   Glucose, Bld 155 (H) 70 - 99 mg/dL    Comment: Glucose reference range applies only to samples taken after fasting for at least 8 hours.   BUN 23 8 - 23 mg/dL   Creatinine, Ser 8.41 0.61 - 1.24 mg/dL   Calcium 8.3 (L) 8.9 - 10.3 mg/dL   Total Protein 5.0 (L) 6.5 -  8.1 g/dL   Albumin 2.2 (L) 3.5 - 5.0 g/dL   AST 31 15 - 41 U/L   ALT 22 0 - 44 U/L   Alkaline Phosphatase 56 38 - 126 U/L   Total Bilirubin 0.5 0.3 - 1.2 mg/dL   GFR, Estimated >66 >06 mL/min    Comment: (NOTE) Calculated using the CKD-EPI Creatinine Equation (2021)    Anion gap 9 5 - 15    Comment: Performed at [facility], [address] [place], [place] [address]  CBC with Differential/Platelet     Status: Abnormal   Collection Time: 09/09/21  2:54 AM  Result Value Ref Range   WBC 7.5 4.0 - 10.5 K/uL   RBC 3.18 (L) 4.22 - 5.81 MIL/uL   Hemoglobin 9.5 (L) 13.0 - 17.0 g/dL   HCT 10.9 (L) 32.3 - 55.7 %   MCV 91.8 80.0 - 100.0 fL   MCH 29.9 26.0 - 34.0 pg   MCHC 32.5 30.0 - 36.0 g/dL   RDW 32.2 02.5 - 42.7 %   Platelets 227 150 - 400 K/uL   nRBC 0.0 0.0 - 0.2 %   Neutrophils Relative % 94 %   Neutro Abs 7.1 1.7 - 7.7 K/uL   Lymphocytes Relative 1 %   Lymphs Abs 0.1 (L) 0.7 - 4.0 K/uL   Monocytes Relative 5 %   Monocytes Absolute 0.4 0.1 -  1.0 K/uL   Eosinophils Relative 0 %   Eosinophils Absolute 0.0 0.0 - 0.5 K/uL   Basophils Relative 0 %   Basophils Absolute 0.0 0.0 - 0.1 K/uL   nRBC 0 0 /100 WBC   Abs Immature Granulocytes 0.00 0.00 - 0.07 K/uL    Comment: Performed at [facility], [address] [place], [place] [address]    MICRO:  IMAGING: CT Angio Chest Pulmonary Embolism (PE) W or WO Contrast  Result Date: 09/08/2021 CLINICAL DATA:  Tachycardia, tachypnea, COVID EXAM: CT ANGIOGRAPHY CHEST WITH CONTRAST TECHNIQUE: Multidetector CT imaging of the chest was performed using the standard protocol during bolus administration of intravenous contrast. Multiplanar CT image reconstructions and MIPs were obtained to evaluate the vascular anatomy. CONTRAST:  50mL OMNIPAQUE IOHEXOL 350 MG/ML SOLN COMPARISON:  Same day chest radiograph FINDINGS: Cardiovascular: Evaluation of the pulmonary arteries is degraded by contrast bolus timing and respiratory motion  artifact in the distal pulmonary arteries. There is no definite pulmonary embolism to the segmental level. The main pulmonary artery is enlarged measuring up to 3.6 cm suggesting pulmonary hypertension. The heart is enlarged. There is no pericardial effusion. Coronary artery calcifications are noted. Mediastinum/Nodes: There is a subcentimeter left thyroid nodule. The esophagus is grossly unremarkable. There is a small hiatal hernia. There are scattered prominent mediastinal lymph nodes measuring up to 8 mm in the precarinal region. Prominent but not pathologically enlarged hilar lymph nodes are also noted. There is no axillary lymphadenopathy. Lungs/Pleura: There is debris in the upper trachea. The remainder of the trachea and central airways are patent, there is marked central bronchial wall thickening. There are scattered patchy ground-glass opacities predominantly at the periphery of the lungs, with the largest focal area ground-glass opacity measuring up to 2.0 cm in the right lower lobe. There is significant interlobular septal thickening. There is no pleural effusion or pneumothorax. Upper Abdomen: The imaged portions of the upper abdominal viscera are unremarkable. Musculoskeletal: There is multilevel degenerative change of the thoracic spine with vacuum disc phenomenon at multiple levels. There is no acute osseous abnormality or aggressive osseous lesion. Review of the MIP images confirms the above findings. IMPRESSION: 1. No definite evidence of pulmonary embolism to the segmental level, within the confines of poor contrast bolus timing and respiratory motion artifact. 2. Scattered patchy ground-glass opacities in the lungs favored to be infectious or inflammatory in etiology with differential including COVID-19. Recommend follow-up CT chest after resolution of symptoms to ensure resolution. 3. Cardiomegaly with moderate pulmonary interstitial edema. Prominent mediastinal and hilar lymph nodes may be  related to heart failure. 4. Enlarged main pulmonary artery suggesting pulmonary hypertension. 5. Debris in the trachea. Correlate with any signs or symptoms of aspiration. 6. Extensive coronary artery calcifications. Electronically Signed   By: Lesia Hausen M.D.   On: 09/08/2021 15:46   CT LUMBAR SPINE W CONTRAST  Result Date: 09/08/2021 CLINICAL DATA:  70 year old male status post multiple spine surgeries, most recently last month with wound drainage. EXAM: CT LUMBAR SPINE WITH CONTRAST TECHNIQUE: Multidetector CT imaging of the lumbar spine was performed with intravenous contrast administration. CONTRAST:  75mL OMNIPAQUE IOHEXOL 350 MG/ML SOLN COMPARISON:  CT lumbar myelogram 05/10/2021. FINDINGS: Segmentation: Normal, the same numbering system used in June. Alignment: Chronic levoconvex lumbar scoliosis is stable. Stable mild retrolisthesis of L1 on L2 and straightening of the visible lower thoracic kyphosis. Vertebrae: Postoperative details are below. Visible lower thoracic levels appear intact. No acute osseous abnormality identified. Paraspinal and other  soft tissues: Patchy and indistinct edema or fluid in the subcutaneous soft tissues posterior to the lumbar spine beginning at the thoracolumbar junction and continuing to the sacrum (series 5, image 109). No rim enhancing or organized collection. No soft tissue gas. Aortoiliac calcified atherosclerosis. Negative visible costophrenic angles and abdominal viscera. Disc levels: T10-T11 and T11-T12: Vacuum disc and disc space loss. No spinal stenosis. T12-L1: Chronic severe disc space loss. Vacuum disc has increased since June. Circumferential disc osteophyte complex eccentric to the left is stable. No significant spinal stenosis. Moderate left T12 foraminal stenosis. L1-L2: Chronic severe disc space loss with increased vacuum disc. Stable mild retrolisthesis. Circumferential disc osteophyte complex with mild to moderate posterior element hypertrophy appears  stable. Stable borderline spinal stenosis suspected, moderate bilateral L1 foraminal stenosis. L2-L3: Chronic posterior and interbody fusion. Stable pedicle screws and interbody implant without loosening. Stable interbody arthrodesis (sagittal image 46). Residual posterior element spurring with no definite posterior element arthrodesis. No stenosis. L3-L4: Chronic posterior and interbody fusion with stable hardware. Stable interbody and right side posterior element arthrodesis. No significant stenosis. L4-L5: A 2nd set of spinal rods begins at this level, but chronic L4 and L5 pedicle screws and interbody implant here appears stable without loosening. However, no convincing arthrodesis. Moderate facet hypertrophy is stable. The L5 screws traverse the facet joints as before. Chronic endplate spurring. Stable borderline to mild osseous foraminal stenosis. L5-S1: Chronic anterior and posterior fusion hardware at this level with mildly increased lucency along the bilateral S1 pedicle screws since June. Some anterior interbody screw loosening also redemonstrated. No arthrodesis. Moderate to severe residual endplate and facet spurring maximal at the left L5 neural foramen. This level has not significantly changed since June. New bilateral cortical screws traversing the SI joints and extending into the iliac bones appear intact without evidence of loosening. IMPRESSION: 1. Patchy subcutaneous edema and/or fluid throughout the posterior paraspinal soft tissues. No organized or rim enhancing fluid collection, and no soft tissue gas. 2. Chronic L4-L5 and L5-S1 pseudoarthrosis, chronic L5 and S1 hardware loosening. 3. Addition of new bilateral sacroiliac screws and new interlocking posterior spinal rods since June - with no adverse features associated with this hardware. 4. Stable L2 through L4 spinal hardware without loosening, and solid arthrodesis suspected at L2-L3 and L3-L4. 5. Advanced degeneration in the lower thoracic  levels and adjacent L1-L2 segment. Increased vacuum disc since June but otherwise stable. 6.  Aortic Atherosclerosis (ICD10-I70.0). Electronically Signed   By: Odessa Fleming M.D.   On: 09/08/2021 06:03   Portable chest 1 View  Result Date: 09/08/2021 CLINICAL DATA:  Recent back surgery. No with back spasms and cloudy drainage. EXAM: PORTABLE CHEST 1 VIEW COMPARISON:  07/28/14 FINDINGS: Enlargement of the cardiac silhouette may reflect portable technique. No signs of pleural effusion. Asymmetric increase interstitial markings identified within the right upper lobe. No focal airspace consolidation. No signs of pleural effusion. Visualized osseous structures appear intact. IMPRESSION: Asymmetric increase interstitial markings within the right upper lobe may reflect interstitial edema or atypical infection. Electronically Signed   By: Signa Kell M.D.   On: 09/08/2021 11:38    HISTORICAL MICRO/IMAGING  Assessment/Plan:    #MSSA Bacteremia Blood cultures performed and were noted to grow MSSA. Wound cultures were also taken during the patient's I&D and growth is significant for abundant gram positive cocci. Patient was initially started on IV vanc, metronidazole, and rocephin, however, will discontinue those meds and continue with IV ancef.  - Continue IV ancef 2 g q8h,  likely will require 6 weeks of therapy - Repeat blood cultures - Echo to be performed today - Could benefit from Rifampin in future after blood cultures clear, given the patient's hardware for activity against biofilm production   #COVID-19 infection - Continue decadron - Maintain O2 sat >90% - Management per primary team

## 2021-09-09 NOTE — Progress Notes (Signed)
Subjective: Patient reports that he is doing well overall. He has some incisional pain with movement. He was incidently found to have COVID-19. No acute events overnight.   Objective: Vital signs in last 24 hours: Temp:  [98.2 F (36.8 C)-101.7 F (38.7 C)] 98.5 F (36.9 C) (10/06 0400) Pulse Rate:  [81-120] 88 (10/06 0650) Resp:  [14-23] 15 (10/06 0650) BP: (77-121)/(44-83) 93/59 (10/06 0650) SpO2:  [92 %-96 %] 95 % (10/06 0650) Weight:  [97.6 kg] 97.6 kg (10/05 2100)  Intake/Output from previous day: 10/05 0701 - 10/06 0700 In: 4197.2 [P.O.:240; I.V.:1968.2; IV Piggyback:1989] Out: 836 [Urine:750; Drains:61; Blood:25] Intake/Output this shift: No intake/output data recorded.  Physical Exam: Patient is awake, A/O X 4, conversant, and in good spirits. They are in NAD and VSS. Doing well. Speech is fluent and appropriate. MAEW with good strength that is symmetric bilaterally. 5/5 BUE/BLE. Sensation to light touch is intact. PERLA, EOMI. CNs grossly intact. Dressing is clean dry intact. Drain with approximately 61 ml of output overnight.     Lab Results: Recent Labs    09/08/21 0253 09/08/21 2141 09/09/21 0254  WBC 17.6*  --  7.5  HGB 11.8* 9.9* 9.5*  HCT 36.3* 30.9* 29.2*  PLT 343  --  227   BMET Recent Labs    09/08/21 0253 09/09/21 0254  NA 133* 134*  K 4.2 4.1  CL 97* 102  CO2 26 23  GLUCOSE 150* 155*  BUN 25* 23  CREATININE 0.95 0.88  CALCIUM 9.1 8.3*    Studies/Results: CT Angio Chest Pulmonary Embolism (PE) W or WO Contrast  Result Date: 09/08/2021 CLINICAL DATA:  Tachycardia, tachypnea, COVID EXAM: CT ANGIOGRAPHY CHEST WITH CONTRAST TECHNIQUE: Multidetector CT imaging of the chest was performed using the standard protocol during bolus administration of intravenous contrast. Multiplanar CT image reconstructions and MIPs were obtained to evaluate the vascular anatomy. CONTRAST:  33mL OMNIPAQUE IOHEXOL 350 MG/ML SOLN COMPARISON:  Same day chest radiograph  FINDINGS: Cardiovascular: Evaluation of the pulmonary arteries is degraded by contrast bolus timing and respiratory motion artifact in the distal pulmonary arteries. There is no definite pulmonary embolism to the segmental level. The main pulmonary artery is enlarged measuring up to 3.6 cm suggesting pulmonary hypertension. The heart is enlarged. There is no pericardial effusion. Coronary artery calcifications are noted. Mediastinum/Nodes: There is a subcentimeter left thyroid nodule. The esophagus is grossly unremarkable. There is a small hiatal hernia. There are scattered prominent mediastinal lymph nodes measuring up to 8 mm in the precarinal region. Prominent but not pathologically enlarged hilar lymph nodes are also noted. There is no axillary lymphadenopathy. Lungs/Pleura: There is debris in the upper trachea. The remainder of the trachea and central airways are patent, there is marked central bronchial wall thickening. There are scattered patchy ground-glass opacities predominantly at the periphery of the lungs, with the largest focal area ground-glass opacity measuring up to 2.0 cm in the right lower lobe. There is significant interlobular septal thickening. There is no pleural effusion or pneumothorax. Upper Abdomen: The imaged portions of the upper abdominal viscera are unremarkable. Musculoskeletal: There is multilevel degenerative change of the thoracic spine with vacuum disc phenomenon at multiple levels. There is no acute osseous abnormality or aggressive osseous lesion. Review of the MIP images confirms the above findings. IMPRESSION: 1. No definite evidence of pulmonary embolism to the segmental level, within the confines of poor contrast bolus timing and respiratory motion artifact. 2. Scattered patchy ground-glass opacities in the lungs favored to be infectious  or inflammatory in etiology with differential including COVID-19. Recommend follow-up CT chest after resolution of symptoms to ensure  resolution. 3. Cardiomegaly with moderate pulmonary interstitial edema. Prominent mediastinal and hilar lymph nodes may be related to heart failure. 4. Enlarged main pulmonary artery suggesting pulmonary hypertension. 5. Debris in the trachea. Correlate with any signs or symptoms of aspiration. 6. Extensive coronary artery calcifications. Electronically Signed   By: Lesia Hausen M.D.   On: 09/08/2021 15:46   CT LUMBAR SPINE W CONTRAST  Result Date: 09/08/2021 CLINICAL DATA:  70 year old male status post multiple spine surgeries, most recently last month with wound drainage. EXAM: CT LUMBAR SPINE WITH CONTRAST TECHNIQUE: Multidetector CT imaging of the lumbar spine was performed with intravenous contrast administration. CONTRAST:  85mL OMNIPAQUE IOHEXOL 350 MG/ML SOLN COMPARISON:  CT lumbar myelogram 05/10/2021. FINDINGS: Segmentation: Normal, the same numbering system used in June. Alignment: Chronic levoconvex lumbar scoliosis is stable. Stable mild retrolisthesis of L1 on L2 and straightening of the visible lower thoracic kyphosis. Vertebrae: Postoperative details are below. Visible lower thoracic levels appear intact. No acute osseous abnormality identified. Paraspinal and other soft tissues: Patchy and indistinct edema or fluid in the subcutaneous soft tissues posterior to the lumbar spine beginning at the thoracolumbar junction and continuing to the sacrum (series 5, image 109). No rim enhancing or organized collection. No soft tissue gas. Aortoiliac calcified atherosclerosis. Negative visible costophrenic angles and abdominal viscera. Disc levels: T10-T11 and T11-T12: Vacuum disc and disc space loss. No spinal stenosis. T12-L1: Chronic severe disc space loss. Vacuum disc has increased since June. Circumferential disc osteophyte complex eccentric to the left is stable. No significant spinal stenosis. Moderate left T12 foraminal stenosis. L1-L2: Chronic severe disc space loss with increased vacuum disc.  Stable mild retrolisthesis. Circumferential disc osteophyte complex with mild to moderate posterior element hypertrophy appears stable. Stable borderline spinal stenosis suspected, moderate bilateral L1 foraminal stenosis. L2-L3: Chronic posterior and interbody fusion. Stable pedicle screws and interbody implant without loosening. Stable interbody arthrodesis (sagittal image 46). Residual posterior element spurring with no definite posterior element arthrodesis. No stenosis. L3-L4: Chronic posterior and interbody fusion with stable hardware. Stable interbody and right side posterior element arthrodesis. No significant stenosis. L4-L5: A 2nd set of spinal rods begins at this level, but chronic L4 and L5 pedicle screws and interbody implant here appears stable without loosening. However, no convincing arthrodesis. Moderate facet hypertrophy is stable. The L5 screws traverse the facet joints as before. Chronic endplate spurring. Stable borderline to mild osseous foraminal stenosis. L5-S1: Chronic anterior and posterior fusion hardware at this level with mildly increased lucency along the bilateral S1 pedicle screws since June. Some anterior interbody screw loosening also redemonstrated. No arthrodesis. Moderate to severe residual endplate and facet spurring maximal at the left L5 neural foramen. This level has not significantly changed since June. New bilateral cortical screws traversing the SI joints and extending into the iliac bones appear intact without evidence of loosening. IMPRESSION: 1. Patchy subcutaneous edema and/or fluid throughout the posterior paraspinal soft tissues. No organized or rim enhancing fluid collection, and no soft tissue gas. 2. Chronic L4-L5 and L5-S1 pseudoarthrosis, chronic L5 and S1 hardware loosening. 3. Addition of new bilateral sacroiliac screws and new interlocking posterior spinal rods since June - with no adverse features associated with this hardware. 4. Stable L2 through L4 spinal  hardware without loosening, and solid arthrodesis suspected at L2-L3 and L3-L4. 5. Advanced degeneration in the lower thoracic levels and adjacent L1-L2 segment. Increased vacuum disc  since June but otherwise stable. 6.  Aortic Atherosclerosis (ICD10-I70.0). Electronically Signed   By: Odessa Fleming M.D.   On: 09/08/2021 06:03   Portable chest 1 View  Result Date: 09/08/2021 CLINICAL DATA:  Recent back surgery. No with back spasms and cloudy drainage. EXAM: PORTABLE CHEST 1 VIEW COMPARISON:  07/28/14 FINDINGS: Enlargement of the cardiac silhouette may reflect portable technique. No signs of pleural effusion. Asymmetric increase interstitial markings identified within the right upper lobe. No focal airspace consolidation. No signs of pleural effusion. Visualized osseous structures appear intact. IMPRESSION: Asymmetric increase interstitial markings within the right upper lobe may reflect interstitial edema or atypical infection. Electronically Signed   By: Signa Kell M.D.   On: 09/08/2021 11:38    Assessment/Plan: Patient is post-op day 1 s/p incision and drainage of lumbar wound. He is recovering well and complains only of mild incisional discomfort with movement. Begin PT/OT. Continue working on pain control, mobility and ambulating patient. Will remove drain today. Abx per primary.   LOS: 1 day     Council Mechanic, DNP, NP-C 09/09/2021, 8:03 AM

## 2021-09-09 NOTE — Assessment & Plan Note (Signed)
H&H stable.  Monitor 

## 2021-09-09 NOTE — Evaluation (Signed)
PAtPhysical Therapy Evaluation Patient Details Name: Michael Hill MRN: [id number] DOB: 1951/11/27 Today's Date: 09/09/2021  History of Present Illness  Michael Hill is a 70 y.o. presents with complaints of a wound infection. I & D of lumbar wound 09/08/2021. Blood cultures positive for MSSA and pt COVID+.  PMH signficant for shingles, HTN, ALIF 2015, B knee arthroscopy (x4 total), R RCR, and spondylolysis s/p L5/S1 decompression/fusion/exploration of adjacent adjacent level fusion with extension to pelvis on 08/24/2021.  Clinical Impression  Patient presents with limited mobility due to pain and mild LE weakness as well as back precautions and now with COVID restrictions.  He was able to ambulate in the room with RW and minguard A to S.  He stood with minguard A from the chair and reports some issues with bed mobility, though able to relate technique as well as for stairs with side stepping.  Feel he will benefit from skilled PT in the acute setting but not likely to need follow up PT at d/c as has good family support system.        Recommendations for follow up therapy are one component of a multi-disciplinary discharge planning process, led by the attending physician.  Recommendations may be updated based on patient status, additional functional criteria and insurance authorization.  Follow Up Recommendations No PT follow up    Equipment Recommendations  None recommended by PT    Recommendations for Other Services       Precautions / Restrictions Precautions Precautions: Fall;Back Precaution Booklet Issued: No Precaution Comments: Pt has handout from 2 weeks ago, able to recall 3/3 back precautions Required Braces or Orthoses: Spinal Brace Spinal Brace: Lumbar corset;Applied in sitting position Restrictions Weight Bearing Restrictions: No      Mobility  Bed Mobility               General bed mobility comments: in recliner    Transfers Overall transfer level: Needs  assistance Equipment used: Rolling walker (2 wheeled) Transfers: Sit to/from Stand Sit to Stand: Min guard         General transfer comment: VCs for safe hand placement  Ambulation/Gait Ambulation/Gait assistance: Min guard Gait Distance (Feet): 120 Feet Assistive device: Rolling walker (2 wheeled) Gait Pattern/deviations: Step-through pattern;Decreased stride length;Trunk flexed     General Gait Details: straightens up when stopped, but increased trunk flexion as increased distance, walked laps in his room  Stairs            Wheelchair Mobility    Modified Rankin (Stroke Patients Only)       Balance Overall balance assessment: Needs assistance Sitting-balance support: No upper extremity supported;Feet supported Sitting balance-Leahy Scale: Good     Standing balance support: No upper extremity supported Standing balance-Leahy Scale: Fair Standing balance comment: standing at RW to wash up front peri area                             Pertinent Vitals/Pain Pain Assessment: No/denies pain Faces Pain Scale: Hurts even more Pain Location: Incision site Pain Descriptors / Indicators: Operative site guarding;Sore Pain Intervention(s): Monitored during session;Repositioned    Home Living Family/patient expects to be discharged to:: Private residence Living Arrangements: Spouse/significant other;Children Available Help at Discharge: Family;Available 24 hours/day Type of Home: House Home Access: Stairs to enter Entrance Stairs-Rails: Right;Left;Can reach both Entrance Stairs-Number of Steps: 4-5 Home Layout: One level Home Equipment: Walker - 2 wheels;Grab bars - tub/shower;Toilet riser;Adaptive equipment  Prior Function Level of Independence: Independent         Comments: Enjoys working in his garden. He is a retired Designer, [employer]   Dominant Hand: Right    Extremity/Trunk Assessment   Upper Extremity Assessment Upper  Extremity Assessment: Defer to OT evaluation    Lower Extremity Assessment Lower Extremity Assessment: Overall WFL for tasks assessed       Communication   Communication: No difficulties  Cognition Arousal/Alertness: Awake/alert Behavior During Therapy: WFL for tasks assessed/performed Overall Cognitive Status: Within Functional Limits for tasks assessed                                        General Comments General comments (skin integrity, edema, etc.): SpO2 91% with O2 prongs out of his nose (4LPM), applied to his nostrils and SpO2 94%, HR fluctuated 100's up to 130's, with pt reporting has had arrhthmia all his life    Exercises     Assessment/Plan    PT Assessment Patient needs continued PT services  PT Problem List Decreased strength;Decreased balance;Decreased activity tolerance;Decreased mobility;Decreased knowledge of use of DME;Decreased knowledge of precautions;Cardiopulmonary status limiting activity       PT Treatment Interventions DME instruction;Therapeutic activities;Gait training;Therapeutic exercise;Patient/family education;Balance training;Functional mobility training    PT Goals (Current goals can be found in the Care Plan section)  Acute Rehab PT Goals Patient Stated Goal: to get over COVID and back infection PT Goal Formulation: With patient Time For Goal Achievement: 09/23/21 Potential to Achieve Goals: Good    Frequency Min 5X/week   Barriers to discharge        Co-evaluation               AM-PAC PT "6 Clicks" Mobility  Outcome Measure Help needed turning from your back to your side while in a flat bed without using bedrails?: A Little Help needed moving from lying on your back to sitting on the side of a flat bed without using bedrails?: A Little Help needed moving to and from a bed to a chair (including a wheelchair)?: A Little Help needed standing up from a chair using your arms (e.g., wheelchair or bedside chair)?:  A Little Help needed to walk in hospital room?: A Little Help needed climbing 3-5 steps with a railing? : A Little 6 Click Score: 18    End of Session Equipment Utilized During Treatment: Back brace Activity Tolerance: Patient tolerated treatment well Patient left: in chair;with call bell/phone within reach;with family/visitor present;with chair alarm set   PT Visit Diagnosis: Other abnormalities of gait and mobility (R26.89)    Time: 6834-1962 PT Time Calculation (min) (ACUTE ONLY): 27 min   Charges:   PT Evaluation $PT Eval Moderate Complexity: 1 Mod PT Treatments $Gait Training: 8-22 mins        Sheran Lawless, PT Acute Rehabilitation Services Pager:[phone] [phone] 09/09/2021   Elray Mcgregor 09/09/2021, 5:10 PM

## 2021-09-09 NOTE — Assessment & Plan Note (Addendum)
Presents with hypotension tachycardia and hypoxia.  Meeting SIRS criteria. Infection in recent surgical site on his spine S/P I and D.  By neurosurgery. Blood cultures positive for MSSA. ID consulted. Echocardiogram negative for any vegetation.  Cardiology consult for TEE. Negative for vegetation.  PICC line inserted.  Blood cultures negative for 48 hours. Currently on IV cefazolin.  Rifampin added.  OPAT orders signed.

## 2021-09-09 NOTE — Progress Notes (Signed)
  Progress Note    Michael Hill   BEM:[id number]  DOB: Aug 11, 1951  DOA: 09/08/2021     1 Date of Service: 09/09/2021  Subjective:  Continues to have shortness of breath.  Cough.  No nausea no vomiting but still has some back pain which is improving.  Overall diarrhea constipation.  Hospital Problems * Severe Sepsis due to methicillin susceptible Staphylococcus aureus (MSSA) with acute hypoxic respiratory failure without septic shock (HCC) Presents with hypotension tachycardia and hypoxia.  Meeting SIRS criteria. Infection in recent surgical site on his spine S/P I and D.  By neurosurgery. Blood cultures positive for MSSA. ID consulted. Echocardiogram ordered. Repeat cultures ordered. Currently on IV cefazolin. Fever curve improving.    COVID-19 virus infection See acute hypoxic respiratory failure.  Acute respiratory failure with hypoxia (HCC) On 4 L of oxygen.  Does not use oxygen at home. Saturating 90% right now. Likely secondary to COVID infection. CT PE protocol negative for pulmonary embolism. Mildly volume overloaded therefore will discontinue IV fluids Currently on remdesivir which I will continue. Also on Decadron unfortunately at this can cause poor wound healing as well as worsening infection but given his severity of illness with COVID-19 I will continue.  Hyponatremia Corrected.  From poor p.o. intake.  Elevated AST (SGOT) Likely secondary to infection.  Potentially demand ischemia as well.  Monitor.  Postoperative anemia H&H stable.  Monitor.  Obesity (BMI 30.0-34.9) Placing the patient at high risk for poor outcome in the setting of COVID infection as well as wound infection.  Monitor.  HYPERCHOLESTEROLEMIA On Lipitor.  Continue.     Objective Vital signs were reviewed and unremarkable except for: Blood pressure:     Exam Physical Exam Constitutional:      Appearance: Normal appearance.  HENT:     Head: Normocephalic.     Mouth/Throat:      Mouth: Mucous membranes are moist.     Pharynx: Oropharynx is clear.  Eyes:     Extraocular Movements: Extraocular movements intact.     Pupils: Pupils are equal, round, and reactive to light.  Cardiovascular:     Rate and Rhythm: Normal rate and regular rhythm.     Heart sounds: No murmur heard. Pulmonary:     Effort: Pulmonary effort is normal.     Breath sounds: Normal breath sounds.  Abdominal:     General: Bowel sounds are normal.     Palpations: Abdomen is soft.  Musculoskeletal:        General: Swelling present.  Neurological:     General: No focal deficit present.     Mental Status: He is alert and oriented to person, place, and time.     Labs / Other Information Positive blood cultures. Elevated CRP.  Trending up. D-dimer elevated as well.    Time spent: [facility] 09/09/2021, 7:06 PM

## 2021-09-09 NOTE — Transfer of Care (Signed)
Immediate Anesthesia Transfer of Care Note  Patient: Michael Hill  Procedure(s) Performed: Incision and drainage of lumbar wound (Spine Lumbar)  Patient Location: PACU  Anesthesia Type:General  Level of Consciousness: awake and alert   Airway & Oxygen Therapy: Patient Spontanous Breathing and Patient connected to face mask oxygen  Post-op Assessment: Report given to RN and Post -op Vital signs reviewed and stable  Post vital signs: Reviewed and stable  Last Vitals:  Vitals Value Taken Time  BP 71/55 09/09/21 0848  Temp 36.4 C 09/09/21 0747  Pulse 87 09/09/21 0854  Resp 19 09/09/21 0854  SpO2 93 % 09/09/21 0854  Vitals shown include unvalidated device data.  Last Pain:  Vitals:   09/09/21 0747  TempSrc: Oral  PainSc:       Patients Stated Pain Goal: 2 (09/09/21 0534)  Complications: No notable events documented.

## 2021-09-09 NOTE — Progress Notes (Signed)
  Echocardiogram 2D Echocardiogram has been performed.  Michael Hill F 09/09/2021, 11:27 AM

## 2021-09-09 NOTE — Evaluation (Signed)
Occupational Therapy Evaluation Patient Details Name: Michael Hill MRN: [id number] DOB: 1950/12/10 Today's Date: 09/09/2021   History of Present Illness Michael Hill is a 70 y.o. presents with complaints of a wound infection. Within the last week he had some muscle spasms of his lower back that made it difficult for him to get around and urinary incontinence because of not being able to get to the bathroom in time, sleeping more than usual, daughter noticed purulent drainage and some redness from his incision site for which she had concern for the possibility of infection. In addition to the back spasms patient reports that he has had some mild leg swelling. I & D of lumbar wound 09/08/2021. Wound cultures are growing gram positive cocci and the patient was also noted to have MSSA growing in his blood cultures. Incidently found to be COVID +.  PMH signficant for shingles, HTN, ALIF 2015, B knee arthroscopy (x4 total), R RCR, and spondylolysis s/p L5/S1 decompression/fusion/exploration of adjacent adjacent level fusion with extension to pelvis on 08/24/2021.   Clinical Impression   This 70 yo male admitted and underwent above presents to acute OT with pt remembering his back precautions, moving around the room at a min guard A level with RW, and needing some A for LB ADLs that he reports his family can A with. Sats on RA 89% without SOB, educated on purse lipped breathing and left him on 4 liters of O2 satting at 94%. Acute OT will sign off.     Recommendations for follow up therapy are one component of a multi-disciplinary discharge planning process, led by the attending physician.  Recommendations may be updated based on patient status, additional functional criteria and insurance authorization.   Follow Up Recommendations  No OT follow up;Supervision - Intermittent    Equipment Recommendations  None recommended by OT       Precautions / Restrictions Precautions Precautions:  Fall;Back Precaution Booklet Issued: No Precaution Comments: Pt has handout from 2 weeks ago, able to recall 3/3 back precautions Required Braces or Orthoses: Spinal Brace Spinal Brace: Lumbar corset;Applied in supine position Restrictions Weight Bearing Restrictions: No      Mobility Bed Mobility               General bed mobility comments: Pt up in bathroom on 3n1 upon my entry    Transfers Overall transfer level: Needs assistance Equipment used: Rolling walker (2 wheeled) Transfers: Sit to/from Stand Sit to Stand: Min guard         General transfer comment: VCs for safe hand placement    Balance Overall balance assessment: Needs assistance Sitting-balance support: No upper extremity supported;Feet supported Sitting balance-Leahy Scale: Good     Standing balance support: No upper extremity supported Standing balance-Leahy Scale: Fair Standing balance comment: standing at RW to wash up front peri area                           ADL either performed or assessed with clinical judgement   ADL Overall ADL's : Needs assistance/impaired Eating/Feeding: Independent;Sitting   Grooming: Set up;Sitting   Upper Body Bathing: Set up;Sitting   Lower Body Bathing: Moderate assistance Lower Body Bathing Details (indicate cue type and reason): Lower legs and back peri area, min guard A sit<>stand Upper Body Dressing : Set up;Sitting Upper Body Dressing Details (indicate cue type and reason): including brace Lower Body Dressing: Maximal assistance Lower Body Dressing Details (indicate cue type  and reason): min guard A sit<>stand Toilet Transfer: Min guard;Ambulation;RW;BSC Toilet Transfer Details (indicate cue type and reason): over toilet Toileting- Clothing Manipulation and Hygiene: Min guard;Sit to/from Nurse, children's Details (indicate cue type and reason): Pt does not have a shower seat in his walk in shower at home and feels he does not  need one--"I was doing well without one since my last surgery, just standing up"   General ADL Comments: Reports family can A prn for any ADLs he cannot do on his own     Vision Ability to See in Adequate Light: 0 Adequate Patient Visual Report: No change from baseline              Pertinent Vitals/Pain Pain Assessment: No/denies pain     Hand Dominance Right   Extremity/Trunk Assessment Upper Extremity Assessment Upper Extremity Assessment: Overall WFL for tasks assessed           Communication Communication Communication: No difficulties   Cognition Arousal/Alertness: Awake/alert Behavior During Therapy: WFL for tasks assessed/performed Overall Cognitive Status: Within Functional Limits for tasks assessed                                     General Comments  On RA pts sats 89% with occassional tachycardia (x3 while I was with him up to 130, baseline mid 90s), on 4 liters up to 94% and educated on purse lipped breathing. Pt was not SOB at all during entire B/D/toileting session.            Home Living Family/patient expects to be discharged to:: Private residence Living Arrangements: Spouse/significant other;Children Available Help at Discharge: Family;Available 24 hours/day Type of Home: House Home Access: Stairs to enter [employer] of Steps: 4-5 Entrance Stairs-Rails: Right;Left;Can reach both Home Layout: One level     Bathroom Shower/Tub: Walk-in shower;Tub/shower unit   Bathroom Toilet: Standard     Home Equipment: Walker - 2 wheels;Grab bars - tub/shower;Toilet riser;Adaptive equipment Adaptive Equipment: Reacher        Prior Functioning/Environment Level of Independence: Independent        Comments: Enjoys working in his garden. He is a retired Optometrist        OT Problem List: Decreased strength;Decreased range of motion;Impaired balance (sitting and/or standing)      OT Treatment/Interventions:      OT  Goals(Current goals can be found in the care plan section) Acute Rehab OT Goals Patient Stated Goal: to get over COVID and back infection   AM-PAC OT "6 Clicks" Daily Activity     Outcome Measure Help from another person eating meals?: None Help from another person taking care of personal grooming?: A Little Help from another person toileting, which includes using toliet, bedpan, or urinal?: A Little Help from another person bathing (including washing, rinsing, drying)?: A Lot Help from another person to put on and taking off regular upper body clothing?: A Little Help from another person to put on and taking off regular lower body clothing?: A Lot 6 Click Score: 17   End of Session Equipment Utilized During Treatment: Back brace;Rolling walker Nurse Communication:  (NT: pt washed up and had a small bowel movement)  Activity Tolerance: Patient tolerated treatment well Patient left: in chair;with call bell/phone within reach;with chair alarm set  OT Visit Diagnosis: Unsteadiness on feet (R26.81);Muscle weakness (generalized) (M62.81)  Time: 1420-1501 OT Time Calculation (min): 41 min Charges:  OT General Charges $OT Visit: 1 Visit OT Evaluation $OT Eval Moderate Complexity: 1 Mod OT Treatments $Self Care/Home Management : 23-37 mins  Ignacia Palma, OTR/L Acute [employer] Pager [phone] Office [phone]    Evette Georges 09/09/2021, 3:15 PM

## 2021-09-09 NOTE — Assessment & Plan Note (Signed)
Corrected.  From poor p.o. intake.

## 2021-09-10 ENCOUNTER — Inpatient Hospital Stay (HOSPITAL_COMMUNITY): Payer: Medicare PPO

## 2021-09-10 DIAGNOSIS — A4101 Sepsis due to Methicillin susceptible Staphylococcus aureus: Secondary | ICD-10-CM | POA: Diagnosis not present

## 2021-09-10 DIAGNOSIS — R7989 Other specified abnormal findings of blood chemistry: Secondary | ICD-10-CM

## 2021-09-10 DIAGNOSIS — I471 Supraventricular tachycardia: Secondary | ICD-10-CM | POA: Diagnosis not present

## 2021-09-10 DIAGNOSIS — R6520 Severe sepsis without septic shock: Secondary | ICD-10-CM

## 2021-09-10 DIAGNOSIS — J9601 Acute respiratory failure with hypoxia: Secondary | ICD-10-CM | POA: Diagnosis not present

## 2021-09-10 DIAGNOSIS — U071 COVID-19: Secondary | ICD-10-CM

## 2021-09-10 DIAGNOSIS — R600 Localized edema: Secondary | ICD-10-CM | POA: Diagnosis present

## 2021-09-10 LAB — CBC WITH DIFFERENTIAL/PLATELET
Abs Immature Granulocytes: 0.02 10*3/uL (ref 0.00–0.07)
Basophils Absolute: 0 10*3/uL (ref 0.0–0.1)
Basophils Relative: 0 %
Eosinophils Absolute: 0 10*3/uL (ref 0.0–0.5)
Eosinophils Relative: 0 %
HCT: 30.9 % — ABNORMAL LOW (ref 39.0–52.0)
Hemoglobin: 10 g/dL — ABNORMAL LOW (ref 13.0–17.0)
Immature Granulocytes: 0 %
Lymphocytes Relative: 7 %
Lymphs Abs: 0.5 10*3/uL — ABNORMAL LOW (ref 0.7–4.0)
MCH: 29.2 pg (ref 26.0–34.0)
MCHC: 32.4 g/dL (ref 30.0–36.0)
MCV: 90.1 fL (ref 80.0–100.0)
Monocytes Absolute: 0.4 10*3/uL (ref 0.1–1.0)
Monocytes Relative: 5 %
Neutro Abs: 5.7 10*3/uL (ref 1.7–7.7)
Neutrophils Relative %: 88 %
Platelets: 240 10*3/uL (ref 150–400)
RBC: 3.43 MIL/uL — ABNORMAL LOW (ref 4.22–5.81)
RDW: 14 % (ref 11.5–15.5)
WBC: 6.5 10*3/uL (ref 4.0–10.5)
nRBC: 0 % (ref 0.0–0.2)

## 2021-09-10 LAB — CULTURE, BLOOD (ROUTINE X 2)
Special Requests: ADEQUATE
Special Requests: ADEQUATE

## 2021-09-10 LAB — COMPREHENSIVE METABOLIC PANEL
ALT: 22 U/L (ref 0–44)
AST: 32 U/L (ref 15–41)
Albumin: 2.2 g/dL — ABNORMAL LOW (ref 3.5–5.0)
Alkaline Phosphatase: 59 U/L (ref 38–126)
Anion gap: 8 (ref 5–15)
BUN: 29 mg/dL — ABNORMAL HIGH (ref 8–23)
CO2: 22 mmol/L (ref 22–32)
Calcium: 8.3 mg/dL — ABNORMAL LOW (ref 8.9–10.3)
Chloride: 101 mmol/L (ref 98–111)
Creatinine, Ser: 0.78 mg/dL (ref 0.61–1.24)
GFR, Estimated: >60 mL/min (ref >60)
Glucose, Bld: 123 mg/dL — ABNORMAL HIGH (ref 70–99)
Potassium: 4.2 mmol/L (ref 3.5–5.1)
Sodium: 131 mmol/L — ABNORMAL LOW (ref 135–145)
Total Bilirubin: 0.7 mg/dL (ref 0.3–1.2)
Total Protein: 5.2 g/dL — ABNORMAL LOW (ref 6.5–8.1)

## 2021-09-10 LAB — PHOSPHORUS: Phosphorus: 1.9 mg/dL — ABNORMAL LOW (ref 2.5–4.6)

## 2021-09-10 LAB — C-REACTIVE PROTEIN: CRP: 21.3 mg/dL — ABNORMAL HIGH (ref <1.0)

## 2021-09-10 LAB — MAGNESIUM: Magnesium: 1.9 mg/dL (ref 1.7–2.4)

## 2021-09-10 LAB — FERRITIN: Ferritin: 301 ng/mL (ref 24–336)

## 2021-09-10 LAB — D-DIMER, QUANTITATIVE: D-Dimer, Quant: 3.09 ug/mL-FEU — ABNORMAL HIGH (ref 0.00–0.50)

## 2021-09-10 MED ORDER — METOPROLOL TARTRATE 5 MG/5ML IV SOLN: 2.5000 mg | Freq: Four times a day (QID) | INTRAVENOUS | Status: DC | PRN

## 2021-09-10 MED ORDER — METOPROLOL TARTRATE 25 MG PO TABS
25.0000 mg | ORAL_TABLET | Freq: Two times a day (BID) | ORAL | Status: DC
  Administered 2021-09-10 – 2021-09-14 (×8): 25 mg via ORAL
  Filled 2021-09-10 (×8): qty 1

## 2021-09-10 MED ORDER — FUROSEMIDE 10 MG/ML IJ SOLN
20.0000 mg | Freq: Once | INTRAMUSCULAR | Status: AC
  Administered 2021-09-11: 20 mg via INTRAVENOUS
  Filled 2021-09-10: qty 2

## 2021-09-10 MED ORDER — FUROSEMIDE 10 MG/ML IJ SOLN: 20.0000 mg | Freq: Once | INTRAMUSCULAR | Status: DC

## 2021-09-10 MED ORDER — K PHOS MONO-SOD PHOS DI & MONO 155-852-130 MG PO TABS
250.0000 mg | ORAL_TABLET | Freq: Three times a day (TID) | ORAL | Status: DC
  Administered 2021-09-10: 250 mg via ORAL
  Filled 2021-09-10: qty 1

## 2021-09-10 NOTE — Progress Notes (Signed)
Subjective: Patient reports  "I feel ok" denying leg pain, reporting mild lumbar discomfort  Objective: Vital signs in last 24 hours: Temp:  [98.2 F (36.8 C)-98.6 F (37 C)] 98.6 F (37 C) (10/07 0416) Pulse Rate:  [82-107] 82 (10/07 0416) Resp:  [14-21] 14 (10/07 0416) BP: (95-125)/(53-71) 100/60 (10/07 0416) SpO2:  [90 %-95 %] 94 % (10/07 0416)  Intake/Output from previous day: 10/06 0701 - 10/07 0700 In: -  Out: 805 [Urine:675; Drains:130] Intake/Output this shift: No intake/output data recorded.  Alert, conversant. Reports resolution of leg pain following initial surgery. Reports mild lumbar discomfort at present. Incision without erythema, swelling or drainage beneath intact honeycomb drsg. Hemovac with mionimal drainage at present, 50ml overnight.  Good strength BLE. Working with PT/OT.   Lab Results: Recent Labs    09/09/21 0254 09/10/21 0544  WBC 7.5 6.5  HGB 9.5* 10.0*  HCT 29.2* 30.9*  PLT 227 240   BMET Recent Labs    09/09/21 0254 09/10/21 0544  NA 134* 131*  K 4.1 4.2  CL 102 101  CO2 23 22  GLUCOSE 155* 123*  BUN 23 29*  CREATININE 0.88 0.78  CALCIUM 8.3* 8.3*    Studies/Results: CT Angio Chest Pulmonary Embolism (PE) W or WO Contrast  Result Date: 09/08/2021 CLINICAL DATA:  Tachycardia, tachypnea, COVID EXAM: CT ANGIOGRAPHY CHEST WITH CONTRAST TECHNIQUE: Multidetector CT imaging of the chest was performed using the standard protocol during bolus administration of intravenous contrast. Multiplanar CT image reconstructions and MIPs were obtained to evaluate the vascular anatomy. CONTRAST:  58mL OMNIPAQUE IOHEXOL 350 MG/ML SOLN COMPARISON:  Same day chest radiograph FINDINGS: Cardiovascular: Evaluation of the pulmonary arteries is degraded by contrast bolus timing and respiratory motion artifact in the distal pulmonary arteries. There is no definite pulmonary embolism to the segmental level. The main pulmonary artery is enlarged measuring up to 3.6 cm  suggesting pulmonary hypertension. The heart is enlarged. There is no pericardial effusion. Coronary artery calcifications are noted. Mediastinum/Nodes: There is a subcentimeter left thyroid nodule. The esophagus is grossly unremarkable. There is a small hiatal hernia. There are scattered prominent mediastinal lymph nodes measuring up to 8 mm in the precarinal region. Prominent but not pathologically enlarged hilar lymph nodes are also noted. There is no axillary lymphadenopathy. Lungs/Pleura: There is debris in the upper trachea. The remainder of the trachea and central airways are patent, there is marked central bronchial wall thickening. There are scattered patchy ground-glass opacities predominantly at the periphery of the lungs, with the largest focal area ground-glass opacity measuring up to 2.0 cm in the right lower lobe. There is significant interlobular septal thickening. There is no pleural effusion or pneumothorax. Upper Abdomen: The imaged portions of the upper abdominal viscera are unremarkable. Musculoskeletal: There is multilevel degenerative change of the thoracic spine with vacuum disc phenomenon at multiple levels. There is no acute osseous abnormality or aggressive osseous lesion. Review of the MIP images confirms the above findings. IMPRESSION: 1. No definite evidence of pulmonary embolism to the segmental level, within the confines of poor contrast bolus timing and respiratory motion artifact. 2. Scattered patchy ground-glass opacities in the lungs favored to be infectious or inflammatory in etiology with differential including COVID-19. Recommend follow-up CT chest after resolution of symptoms to ensure resolution. 3. Cardiomegaly with moderate pulmonary interstitial edema. Prominent mediastinal and hilar lymph nodes may be related to heart failure. 4. Enlarged main pulmonary artery suggesting pulmonary hypertension. 5. Debris in the trachea. Correlate with any signs or symptoms  of aspiration.  6. Extensive coronary artery calcifications. Electronically Signed   By: Lesia Hausen M.D.   On: 09/08/2021 15:46   Portable chest 1 View  Result Date: 09/08/2021 CLINICAL DATA:  Recent back surgery. No with back spasms and cloudy drainage. EXAM: PORTABLE CHEST 1 VIEW COMPARISON:  07/28/14 FINDINGS: Enlargement of the cardiac silhouette may reflect portable technique. No signs of pleural effusion. Asymmetric increase interstitial markings identified within the right upper lobe. No focal airspace consolidation. No signs of pleural effusion. Visualized osseous structures appear intact. IMPRESSION: Asymmetric increase interstitial markings within the right upper lobe may reflect interstitial edema or atypical infection. Electronically Signed   By: Signa Kell M.D.   On: 09/08/2021 11:38   ECHOCARDIOGRAM COMPLETE  Result Date: 09/09/2021    ECHOCARDIOGRAM REPORT   Patient Name:   Michael Hill Date of Exam: 09/09/2021 Medical Rec #:  [id number]       Height:       67.0 in Accession #:    [id number]      Weight:       215.2 lb Date of Birth:  1951/10/09      BSA:          2.086 m Patient Age:    69 years        BP:           120/69 mmHg Patient Gender: M               HR:           101 bpm. Exam Location:  Inpatient Procedure: 2D Echo, Cardiac Doppler and Color Doppler Indications:    CHF Acute Diastolic I 50.31  History:        Patient has no prior history of Echocardiogram examinations.                 Arrythmias:Tachycardia. Sepsis. Deveoped infection post back                 surgery. Covid 19 positive. Edema.  Sonographer:    Roosvelt Maser RDCS Referring Phys: [id number] RONDELL A SMITH IMPRESSIONS  1. Left ventricular ejection fraction, by estimation, is 55 to 60%. The left ventricle has normal function. The left ventricle has no regional wall motion abnormalities. Left ventricular diastolic function could not be evaluated.  2. Right ventricular systolic function was not well visualized. The right ventricular  size is not well visualized. There is normal pulmonary artery systolic pressure.  3. Left atrial size was mildly dilated.  4. The mitral valve is normal in structure. Trivial mitral valve regurgitation.  5. The aortic valve is normal in structure. Aortic valve regurgitation is not visualized. FINDINGS  Left Ventricle: Left ventricular ejection fraction, by estimation, is 55 to 60%. The left ventricle has normal function. The left ventricle has no regional wall motion abnormalities. The left ventricular internal cavity size was normal in size. There is  no left ventricular hypertrophy. Left ventricular diastolic function could not be evaluated. Right Ventricle: The right ventricular size is not well visualized. Right vetricular wall thickness was not well visualized. Right ventricular systolic function was not well visualized. There is normal pulmonary artery systolic pressure. The tricuspid regurgitant velocity is 2.53 m/s, and with an assumed right atrial pressure of 3 mmHg, the estimated right ventricular systolic pressure is 28.6 mmHg. Left Atrium: Left atrial size was mildly dilated. Right Atrium: Right atrial size was not well visualized. Pericardium: There is no evidence of pericardial effusion. Mitral  Valve: The mitral valve is normal in structure. Trivial mitral valve regurgitation. Tricuspid Valve: The tricuspid valve is normal in structure. Tricuspid valve regurgitation is not demonstrated. Aortic Valve: The aortic valve is normal in structure. Aortic valve regurgitation is not visualized. Pulmonic Valve: The pulmonic valve was normal in structure. Pulmonic valve regurgitation is trivial. Aorta: The aortic root is normal in size and structure. IAS/Shunts: The atrial septum is grossly normal.  LEFT VENTRICLE PLAX 2D LVIDd:         5.30 cm LVIDs:         3.30 cm LV PW:         1.30 cm LV IVS:        1.10 cm  IVC IVC diam: 1.30 cm LEFT ATRIUM             Index LA diam:        4.00 cm 1.92 cm/m LA Vol (A2C):    61.3 ml 29.39 ml/m LA Vol (A4C):   84.3 ml 40.41 ml/m LA Biplane Vol: 73.6 ml 35.28 ml/m   AORTA Ao Root diam: 3.90 cm TRICUSPID VALVE TR Peak grad:   25.6 mmHg TR Vmax:        253.00 cm/s Kristeen Miss MD Electronically signed by Kristeen Miss MD Signature Date/Time: 09/09/2021/2:25:35 PM    Final     Assessment/Plan:   LOS: 2 days  Per Dr. Venetia Maxon, d/c hemovac.  Change honeycomb PRN. May leave open to air if not draining. Continue to mobilize in LSO. IVAB per Medicine/ID.   Georgiann Cocker 09/10/2021, 7:55 AM

## 2021-09-10 NOTE — Progress Notes (Signed)
Physical Therapy Treatment Patient Details Name: Michael Hill MRN: [id number] DOB: 1951-02-04 Today's Date: 09/10/2021   History of Present Illness CASH DUCE is a 70 y.o. presents with complaints of a wound infection. I & D of lumbar wound 09/08/2021. Blood cultures positive for MSSA and pt COVID+.  PMH signficant for shingles, HTN, ALIF 2015, B knee arthroscopy (x4 total), R RCR, and spondylolysis s/p L5/S1 decompression/fusion/exploration of adjacent adjacent level fusion with extension to pelvis on 08/24/2021.    PT Comments    Patient progressing able to demonstrate good technique with supine to sit using back precautions without assist.  Able to ambulate today on 3LO2 and maintained O2 sats >90% and with minimal SOB.  Still with HR elevations at times.  Patient educated on pelvic positioning for more neutral spine.  PT will continue to follow acutely.     Recommendations for follow up therapy are one component of a multi-disciplinary discharge planning process, led by the attending physician.  Recommendations may be updated based on patient status, additional functional criteria and insurance authorization.  Follow Up Recommendations  No PT follow up     Equipment Recommendations  None recommended by PT    Recommendations for Other Services       Precautions / Restrictions Precautions Precautions: Fall;Back Required Braces or Orthoses: Spinal Brace Spinal Brace: Lumbar corset;Applied in sitting position     Mobility  Bed Mobility Overal bed mobility: Modified Independent             General bed mobility comments: good technique rolling and side to sit    Transfers Overall transfer level: Needs assistance Equipment used: Rolling walker (2 wheeled) Transfers: Sit to/from Stand Sit to Stand: Min guard         General transfer comment: increased time and scooting for positioning to allow standing after lying supine  Ambulation/Gait Ambulation/Gait  assistance: Min guard Gait Distance (Feet): 160 Feet Assistive device: Rolling walker (2 wheeled) Gait Pattern/deviations: Step-through pattern;Decreased stride length     General Gait Details: in room on 3L O2 with SpO2 > 90% throughout   Stairs             Wheelchair Mobility    Modified Rankin (Stroke Patients Only)       Balance Overall balance assessment: Needs assistance   Sitting balance-Leahy Scale: Good       Standing balance-Leahy Scale: Fair                              Cognition Arousal/Alertness: Awake/alert Behavior During Therapy: WFL for tasks assessed/performed Overall Cognitive Status: Within Functional Limits for tasks assessed                                        Exercises Other Exercises Other Exercises: Educated on post pelvic tilts back to the wall for more neutral pelvic positioning and for lower abdominal strengthening. Performed x 4 with 5 sec hold    General Comments        Pertinent Vitals/Pain Pain Assessment: Faces Faces Pain Scale: Hurts even more Pain Location: Incision site Pain Descriptors / Indicators: Operative site guarding;Sore Pain Intervention(s): Monitored during session;Repositioned;Limited activity within patient's tolerance    Home Living  Prior Function            PT Goals (current goals can now be found in the care plan section) Progress towards PT goals: Progressing toward goals    Frequency    Min 5X/week      PT Plan Current plan remains appropriate    Co-evaluation              AM-PAC PT "6 Clicks" Mobility   Outcome Measure  Help needed turning from your back to your side while in a flat bed without using bedrails?: None Help needed moving from lying on your back to sitting on the side of a flat bed without using bedrails?: None Help needed moving to and from a bed to a chair (including a wheelchair)?: A Little Help  needed standing up from a chair using your arms (e.g., wheelchair or bedside chair)?: A Little Help needed to walk in hospital room?: A Little Help needed climbing 3-5 steps with a railing? : A Little 6 Click Score: 20    End of Session Equipment Utilized During Treatment: Back brace Activity Tolerance: Patient tolerated treatment well Patient left: in bed;with call bell/phone within reach   PT Visit Diagnosis: Other abnormalities of gait and mobility (R26.89)     Time: 1515-1540 PT Time Calculation (min) (ACUTE ONLY): 25 min  Charges:  $Gait Training: 8-22 mins $Therapeutic Exercise: 8-22 mins                     Sheran Lawless, PT Acute Rehabilitation Services Pager:[phone] Office:[phone] 09/10/2021    Elray Mcgregor 09/10/2021, 5:19 PM

## 2021-09-10 NOTE — Hospital Course (Addendum)
10/5 incision and drainage of the lumbar wound 10/6 MSSA grew in the blood culture.  ID consulted. 10/7 has SVTs on telemetry.  Lopressor initiated.  Echocardiogram negative for vegetation.  Lower extremity Doppler negative for DVT. 10/9 PICC line inserted.  Cardiology consult for TEE.

## 2021-09-10 NOTE — Assessment & Plan Note (Addendum)
-   see sepsis 

## 2021-09-10 NOTE — Plan of Care (Signed)

## 2021-09-10 NOTE — Progress Notes (Signed)
[facility]    Date of Admission:  09/08/2021   Total days of antibiotics 3           ID: Michael Hill is a 70 y.o. male with with HTN, HLD, chronic back pain s/p L5-S1 decompression surgery on 08/25/21 admitted for postoperative wound infection and MSSA bacteremia.   Principal Problem:   Severe Sepsis due to methicillin susceptible Staphylococcus aureus (MSSA) with acute hypoxic respiratory failure without septic shock (HCC) Active Problems:   HYPERCHOLESTEROLEMIA   Postoperative wound infection   Acute respiratory failure with hypoxia (HCC)   COVID-19 virus infection   Hypotension   Postoperative anemia   Elevated AST (SGOT)   Obesity (BMI 30.0-34.9)   Hyponatremia    Subjective: Patient feels the same today. Continues to have low back pain although notes that it is improving Denies any fevers, chills, or shortness of breath but remains on 4-5 L [place].   Had TTE performed yesterday with normal EF and no signs of endocarditis or vegetations noted.   Medications:   vitamin C  500 mg Oral Daily   atorvastatin  20 mg Oral Daily   dexamethasone (DECADRON) injection  6 mg Intravenous Q24H   docusate sodium  100 mg Oral BID   enoxaparin (LOVENOX) injection  40 mg Subcutaneous Q24H   famotidine  20 mg Oral Daily   oxyCODONE  10 mg Oral Q12H   pantoprazole  40 mg Oral Daily   zinc sulfate  220 mg Oral Daily    Objective: Vital signs in last 24 hours: Temp:  [98.2 F (36.8 C)-98.7 F (37.1 C)] 98.7 F (37.1 C) (10/07 0820) Pulse Rate:  [82-107] 100 (10/07 0820) Resp:  [14-21] 16 (10/07 0820) BP: (95-125)/(53-71) 121/71 (10/07 0820) SpO2:  [90 %-95 %] 90 % (10/07 0820)   General: No acute distress. CV: RRR. No murmurs, rubs, or gallops.  Pulmonary: Lungs CTAB. Normal effort. No wheezing or rales. Abdominal: Soft, nontender, nondistended. Normal bowel sounds. Extremities/MSK: Palpable radial and DP pulses. Normal ROM. Lumbar wound is dressed  and no surrounding erythema/warmth/drainage noted.  Skin: Warm and dry. No obvious rash or lesions. Neuro: A&Ox3. No focal deficit.    Lab Results Recent Labs    09/09/21 0254 09/10/21 0544  WBC 7.5 6.5  HGB 9.5* 10.0*  HCT 29.2* 30.9*  NA 134* 131*  K 4.1 4.2  CL 102 101  CO2 23 22  BUN 23 29*  CREATININE 0.88 0.78   Liver Panel Recent Labs    09/09/21 0254 09/10/21 0544  PROT 5.0* 5.2*  ALBUMIN 2.2* 2.2*  AST 31 32  ALT 22 22  ALKPHOS 56 59  BILITOT 0.5 0.7   Sedimentation Rate Recent Labs    09/08/21 0525  ESRSEDRATE 60*   C-Reactive Protein Recent Labs    09/09/21 0254 09/10/21 0544  CRP 30.0* 21.3*    Microbiology:  Studies/Results: CT Angio Chest Pulmonary Embolism (PE) W or WO Contrast  Result Date: 09/08/2021 CLINICAL DATA:  Tachycardia, tachypnea, COVID EXAM: CT ANGIOGRAPHY CHEST WITH CONTRAST TECHNIQUE: Multidetector CT imaging of the chest was performed using the standard protocol during bolus administration of intravenous contrast. Multiplanar CT image reconstructions and MIPs were obtained to evaluate the vascular anatomy. CONTRAST:  50mL OMNIPAQUE IOHEXOL 350 MG/ML SOLN COMPARISON:  Same day chest radiograph FINDINGS: Cardiovascular: Evaluation of the pulmonary arteries is degraded by contrast bolus timing and respiratory motion artifact in the distal pulmonary arteries. There is no definite  pulmonary embolism to the segmental level. The main pulmonary artery is enlarged measuring up to 3.6 cm suggesting pulmonary hypertension. The heart is enlarged. There is no pericardial effusion. Coronary artery calcifications are noted. Mediastinum/Nodes: There is a subcentimeter left thyroid nodule. The esophagus is grossly unremarkable. There is a small hiatal hernia. There are scattered prominent mediastinal lymph nodes measuring up to 8 mm in the precarinal region. Prominent but not pathologically enlarged hilar lymph nodes are also noted. There is no axillary  lymphadenopathy. Lungs/Pleura: There is debris in the upper trachea. The remainder of the trachea and central airways are patent, there is marked central bronchial wall thickening. There are scattered patchy ground-glass opacities predominantly at the periphery of the lungs, with the largest focal area ground-glass opacity measuring up to 2.0 cm in the right lower lobe. There is significant interlobular septal thickening. There is no pleural effusion or pneumothorax. Upper Abdomen: The imaged portions of the upper abdominal viscera are unremarkable. Musculoskeletal: There is multilevel degenerative change of the thoracic spine with vacuum disc phenomenon at multiple levels. There is no acute osseous abnormality or aggressive osseous lesion. Review of the MIP images confirms the above findings. IMPRESSION: 1. No definite evidence of pulmonary embolism to the segmental level, within the confines of poor contrast bolus timing and respiratory motion artifact. 2. Scattered patchy ground-glass opacities in the lungs favored to be infectious or inflammatory in etiology with differential including COVID-19. Recommend follow-up CT chest after resolution of symptoms to ensure resolution. 3. Cardiomegaly with moderate pulmonary interstitial edema. Prominent mediastinal and hilar lymph nodes may be related to heart failure. 4. Enlarged main pulmonary artery suggesting pulmonary hypertension. 5. Debris in the trachea. Correlate with any signs or symptoms of aspiration. 6. Extensive coronary artery calcifications. Electronically Signed   By: Lesia Hausen M.D.   On: 09/08/2021 15:46   DG CHEST PORT 1 VIEW  Result Date: 09/10/2021 CLINICAL DATA:  Shortness of breath EXAM: PORTABLE CHEST 1 VIEW COMPARISON:  Radiograph 09/08/2021 FINDINGS: Unchanged cardiomegaly. Increased, diffuse interstitial opacities with increased alveolar opacities in the right mid and lower lung. No large pleural effusion. No visible pneumothorax. No acute  osseous abnormality. IMPRESSION: Increased pulmonary edema.  Unchanged cardiomegaly Electronically Signed   By: Caprice Renshaw M.D.   On: 09/10/2021 09:38   ECHOCARDIOGRAM COMPLETE  Result Date: 09/09/2021    ECHOCARDIOGRAM REPORT   Patient Name:   Michael Hill Date of Exam: 09/09/2021 Medical Rec #:  [id number]       Height:       67.0 in Accession #:    [id number]      Weight:       215.2 lb Date of Birth:  September 26, 1951      BSA:          2.086 m Patient Age:    69 years        BP:           120/69 mmHg Patient Gender: M               HR:           101 bpm. Exam Location:  Inpatient Procedure: 2D Echo, Cardiac Doppler and Color Doppler Indications:    CHF Acute Diastolic I 50.31  History:        Patient has no prior history of Echocardiogram examinations.                 Arrythmias:Tachycardia. Sepsis. Deveoped infection post back  surgery. Covid 19 positive. Edema.  Sonographer:    Roosvelt Maser RDCS Referring Phys: [id number] RONDELL A SMITH IMPRESSIONS  1. Left ventricular ejection fraction, by estimation, is 55 to 60%. The left ventricle has normal function. The left ventricle has no regional wall motion abnormalities. Left ventricular diastolic function could not be evaluated.  2. Right ventricular systolic function was not well visualized. The right ventricular size is not well visualized. There is normal pulmonary artery systolic pressure.  3. Left atrial size was mildly dilated.  4. The mitral valve is normal in structure. Trivial mitral valve regurgitation.  5. The aortic valve is normal in structure. Aortic valve regurgitation is not visualized. FINDINGS  Left Ventricle: Left ventricular ejection fraction, by estimation, is 55 to 60%. The left ventricle has normal function. The left ventricle has no regional wall motion abnormalities. The left ventricular internal cavity size was normal in size. There is  no left ventricular hypertrophy. Left ventricular diastolic function could not be  evaluated. Right Ventricle: The right ventricular size is not well visualized. Right vetricular wall thickness was not well visualized. Right ventricular systolic function was not well visualized. There is normal pulmonary artery systolic pressure. The tricuspid regurgitant velocity is 2.53 m/s, and with an assumed right atrial pressure of 3 mmHg, the estimated right ventricular systolic pressure is 28.6 mmHg. Left Atrium: Left atrial size was mildly dilated. Right Atrium: Right atrial size was not well visualized. Pericardium: There is no evidence of pericardial effusion. Mitral Valve: The mitral valve is normal in structure. Trivial mitral valve regurgitation. Tricuspid Valve: The tricuspid valve is normal in structure. Tricuspid valve regurgitation is not demonstrated. Aortic Valve: The aortic valve is normal in structure. Aortic valve regurgitation is not visualized. Pulmonic Valve: The pulmonic valve was normal in structure. Pulmonic valve regurgitation is trivial. Aorta: The aortic root is normal in size and structure. IAS/Shunts: The atrial septum is grossly normal.  LEFT VENTRICLE PLAX 2D LVIDd:         5.30 cm LVIDs:         3.30 cm LV PW:         1.30 cm LV IVS:        1.10 cm  IVC IVC diam: 1.30 cm LEFT ATRIUM             Index LA diam:        4.00 cm 1.92 cm/m LA Vol (A2C):   61.3 ml 29.39 ml/m LA Vol (A4C):   84.3 ml 40.41 ml/m LA Biplane Vol: 73.6 ml 35.28 ml/m   AORTA Ao Root diam: 3.90 cm TRICUSPID VALVE TR Peak grad:   25.6 mmHg TR Vmax:        253.00 cm/s Kristeen Miss MD Electronically signed by Kristeen Miss MD Signature Date/Time: 09/09/2021/2:25:35 PM    Final      Assessment/Plan: #MSSA Bacteremia MSSA bacteremia in OP cultures and in blood. Started on IV ancef therapy and will need continuation of this for 6 weeks. TTE performed with normal EF and no evidence of endocarditis noted.  - Continue IV ancef x 6 weeks, will start oral abx after completion of IV therapy  - Repeat blood  cultures pending - Will begin rifampin after blood cultures clear, given the patient's hardware for activity against biofilm production  - Will follow up again with patient on Monday   #COVID-19 infection Patient remains on 4-5 L [place].  - Continue decadron - Maintain O2 sat >90% - Management per primary team  Daiden Coltrane [facility] Pager: [phone]  09/10/2021, 10:46 AM

## 2021-09-10 NOTE — Assessment & Plan Note (Addendum)
Lower extremity Doppler negative for DVT.  Swelling improving but still present. Blood pressure soft therefore will provide compression stockings.

## 2021-09-10 NOTE — Progress Notes (Signed)
  Progress Note    Michael Hill   [id number]  DOB: 1951/05/06  DOA: 09/08/2021     2 Date of Service: 09/10/2021   Subjective:  Feeling better.  Appetite improving.  Back pain improving.  No abdominal pain.  No constipation.  Hospital Problems * Severe Sepsis due to methicillin susceptible Staphylococcus aureus (MSSA) with acute hypoxic respiratory failure without septic shock (HCC) Presents with hypotension tachycardia and hypoxia.  Meeting SIRS criteria. Infection in recent surgical site on his spine S/P I and D.  By neurosurgery. Blood cultures positive for MSSA. ID consulted. Echocardiogram negative for any vegetation.  Will likely require TEE. Repeat cultures ordered. Currently on IV cefazolin. Fever curve improving.    Hypotension Currently improving.  Postoperative wound infection see sepsis  COVID-19 virus infection See acute hypoxic respiratory failure.  Acute respiratory failure with hypoxia (HCC) On 4 L of oxygen.  Does not use oxygen at home. Saturating 90% 10/6. Currently on room air at 88%. Likely secondary to COVID infection as well as mild volume overload.. CT PE protocol negative for pulmonary embolism. Currently on remdesivir which I will continue. Also on Decadron unfortunately at this can cause poor wound healing as well as worsening infection but given his severity of illness with COVID-19 I will continue.  SVT (supraventricular tachycardia) (HCC) In the setting of acute infection and sepsis as well as COVID-19. Initiate Lopressor. Heart rate occasionally irregular likely MAT.  No evidence of A. fib so far.  Pedal edema Lower extremity Doppler negative for DVT.  Likely volume overload.  Will benefit from IV diuresis.  Hyponatremia Corrected.  From poor p.o. intake.  Hypophosphatemia Currently replaced.  Elevated AST (SGOT) Likely secondary to infection.  Potentially demand ischemia as well.  Monitor.  Postoperative anemia H&H  stable.  Monitor.  Obesity (BMI 30.0-34.9) Placing the patient at high risk for poor outcome in the setting of COVID infection as well as wound infection.  Monitor.  HYPERCHOLESTEROLEMIA On Lipitor.  Continue.     Objective Having frequent tachycardia on telemetry.  Exam Physical Exam Constitutional:      Appearance: Normal appearance.  HENT:     Head: Normocephalic.     Mouth/Throat:     Mouth: Mucous membranes are moist.     Pharynx: Oropharynx is clear.  Eyes:     Extraocular Movements: Extraocular movements intact.     Pupils: Pupils are equal, round, and reactive to light.  Cardiovascular:     Rate and Rhythm: Normal rate and regular rhythm.     Heart sounds: No murmur heard. Pulmonary:     Effort: Pulmonary effort is normal.     Breath sounds: Normal breath sounds.  Abdominal:     General: Bowel sounds are normal.     Palpations: Abdomen is soft.  Musculoskeletal:        General: Swelling present.  Neurological:     General: No focal deficit present.     Mental Status: He is alert and oriented to person, place, and time.     Labs / Other Information Hypophosphatemia.    Time spent: 35 min [facility] 09/10/2021, 7:59 PM

## 2021-09-10 NOTE — Assessment & Plan Note (Addendum)
In the setting of acute infection and sepsis as well as COVID-19. Initiate Lopressor.  We will transition to Toprol-XL on discharge. Heart rate occasionally irregular likely MAT.  No evidence of A. fib so far.

## 2021-09-10 NOTE — Assessment & Plan Note (Signed)
Currently replaced.

## 2021-09-10 NOTE — Progress Notes (Signed)
Lower extremity venous bilateral study completed.   Please see CV Proc for preliminary results.   Mischele Detter, RDMS, RVT  

## 2021-09-10 NOTE — Assessment & Plan Note (Addendum)
Blood pressure now normal.  Tolerating Lopressor.

## 2021-09-11 DIAGNOSIS — R6520 Severe sepsis without septic shock: Secondary | ICD-10-CM | POA: Diagnosis not present

## 2021-09-11 DIAGNOSIS — J9601 Acute respiratory failure with hypoxia: Secondary | ICD-10-CM | POA: Diagnosis not present

## 2021-09-11 DIAGNOSIS — A4101 Sepsis due to Methicillin susceptible Staphylococcus aureus: Secondary | ICD-10-CM | POA: Diagnosis not present

## 2021-09-11 LAB — CBC WITH DIFFERENTIAL/PLATELET
Abs Immature Granulocytes: 0.03 10*3/uL (ref 0.00–0.07)
Basophils Absolute: 0 10*3/uL (ref 0.0–0.1)
Basophils Relative: 0 %
Eosinophils Absolute: 0 10*3/uL (ref 0.0–0.5)
Eosinophils Relative: 0 %
HCT: 29.5 % — ABNORMAL LOW (ref 39.0–52.0)
Hemoglobin: 9.6 g/dL — ABNORMAL LOW (ref 13.0–17.0)
Immature Granulocytes: 0 %
Lymphocytes Relative: 12 %
Lymphs Abs: 0.9 10*3/uL (ref 0.7–4.0)
MCH: 29.4 pg (ref 26.0–34.0)
MCHC: 32.5 g/dL (ref 30.0–36.0)
MCV: 90.2 fL (ref 80.0–100.0)
Monocytes Absolute: 0.5 10*3/uL (ref 0.1–1.0)
Monocytes Relative: 7 %
Neutro Abs: 6.1 10*3/uL (ref 1.7–7.7)
Neutrophils Relative %: 81 %
Platelets: 223 10*3/uL (ref 150–400)
RBC: 3.27 MIL/uL — ABNORMAL LOW (ref 4.22–5.81)
RDW: 14.2 % (ref 11.5–15.5)
WBC: 7.6 10*3/uL (ref 4.0–10.5)
nRBC: 0 % (ref 0.0–0.2)

## 2021-09-11 LAB — COMPREHENSIVE METABOLIC PANEL
ALT: 23 U/L (ref 0–44)
AST: 33 U/L (ref 15–41)
Albumin: 2 g/dL — ABNORMAL LOW (ref 3.5–5.0)
Alkaline Phosphatase: 59 U/L (ref 38–126)
Anion gap: 5 (ref 5–15)
BUN: 26 mg/dL — ABNORMAL HIGH (ref 8–23)
CO2: 26 mmol/L (ref 22–32)
Calcium: 8 mg/dL — ABNORMAL LOW (ref 8.9–10.3)
Chloride: 102 mmol/L (ref 98–111)
Creatinine, Ser: 0.8 mg/dL (ref 0.61–1.24)
GFR, Estimated: >60 mL/min (ref >60)
Glucose, Bld: 132 mg/dL — ABNORMAL HIGH (ref 70–99)
Potassium: 4.3 mmol/L (ref 3.5–5.1)
Sodium: 133 mmol/L — ABNORMAL LOW (ref 135–145)
Total Bilirubin: 0.8 mg/dL (ref 0.3–1.2)
Total Protein: 4.8 g/dL — ABNORMAL LOW (ref 6.5–8.1)

## 2021-09-11 LAB — PHOSPHORUS: Phosphorus: 2.9 mg/dL (ref 2.5–4.6)

## 2021-09-11 LAB — C-REACTIVE PROTEIN: CRP: 14.2 mg/dL — ABNORMAL HIGH (ref <1.0)

## 2021-09-11 LAB — FERRITIN: Ferritin: 255 ng/mL (ref 24–336)

## 2021-09-11 LAB — MAGNESIUM: Magnesium: 1.8 mg/dL (ref 1.7–2.4)

## 2021-09-11 NOTE — Progress Notes (Signed)
Progress Note    Michael Hill   [id number]  DOB: Jun 25, 1951  DOA: 09/08/2021     3 Date of Service: 09/11/2021     Subjective:  No acute complaint.  No nausea no vomiting.  Now on room air.  Hospital Problems * Severe Sepsis due to methicillin susceptible Staphylococcus aureus (MSSA) with acute hypoxic respiratory failure without septic shock (HCC) Presents with hypotension tachycardia and hypoxia.  Meeting SIRS criteria. Infection in recent surgical site on his spine S/P I and D.  By neurosurgery. Blood cultures positive for MSSA. ID consulted. Echocardiogram negative for any vegetation.  Will likely require TEE. Repeat cultures negative for 24 hours.  Will request ID to consider PICC line tomorrow if remains negative. Currently on IV cefazolin. Fever curve improving.    Hypotension Blood pressure now normal.  Tolerating Lopressor.  COVID-19 virus infection Inflammatory markers are improving. Continue remdesivir and Decadron. See acute hypoxic respiratory failure.  Acute respiratory failure with hypoxia (HCC) Initially on 4 L of oxygen Saturating 90% 10/6.  Does not use oxygen at baseline. Currently on room air 94% Likely secondary to COVID infection as well as mild volume overload.. CT PE protocol negative for pulmonary embolism. Currently on remdesivir which I will continue. Also on Decadron unfortunately at this can cause poor wound healing as well as worsening infection but given his severity of illness with COVID-19 I will continue.  Postoperative wound infection see sepsis  SVT (supraventricular tachycardia) (HCC) In the setting of acute infection and sepsis as well as COVID-19. Initiate Lopressor.  We will transition to Toprol-XL on discharge. Heart rate occasionally irregular likely MAT.  No evidence of A. fib so far.  Pedal edema Lower extremity Doppler negative for DVT.  Likely volume overload.  X1 IV Lasix.  Hyponatremia Corrected.  From poor  p.o. intake.  Hypophosphatemia Currently replaced.  Elevated AST (SGOT) Likely secondary to infection.  Potentially demand ischemia as well.  Monitor.  Postoperative anemia H&H stable.  Monitor.  Obesity (BMI 30.0-34.9) Placing the patient at high risk for poor outcome in the setting of COVID infection as well as wound infection.  Monitor. Body mass index is 33.7 kg/m.   HYPERCHOLESTEROLEMIA On Lipitor.  Continue.     Objective Vital signs were reviewed and unremarkable.  Vitals:   09/11/21 0516 09/11/21 0744 09/11/21 1200 09/11/21 1525  BP: 113/75 101/69 123/64 (!) 108/59  Pulse: 82 72 65 77  Resp:  15 17 17   Temp: 98.4 F (36.9 C) 98.4 F (36.9 C) 98.3 F (36.8 C) 98.3 F (36.8 C)  TempSrc: Oral Oral Oral Oral  SpO2: 94% 96% 94% 94%  Weight:      Height:       97.6 kg  Exam Physical Exam Constitutional:      Appearance: Normal appearance.  HENT:     Head: Normocephalic.     Mouth/Throat:     Mouth: Mucous membranes are moist.     Pharynx: Oropharynx is clear.  Eyes:     Extraocular Movements: Extraocular movements intact.     Pupils: Pupils are equal, round, and reactive to light.  Cardiovascular:     Rate and Rhythm: Normal rate and regular rhythm.     Heart sounds: No murmur heard. Pulmonary:     Effort: Pulmonary effort is normal.     Breath sounds: Normal breath sounds.  Abdominal:     General: Bowel sounds are normal.     Palpations: Abdomen is soft.  Musculoskeletal:  General: Swelling present.  Neurological:     General: No focal deficit present.     Mental Status: He is alert and oriented to person, place, and time.     Labs / Other Information My review of labs, imaging, notes and other tests shows no new significant findings.     Time spent: 47  [facility] 09/11/2021, 7:31 PM

## 2021-09-11 NOTE — Progress Notes (Signed)
   Providing Compassionate, Quality Care - Together  NEUROSURGERY PROGRESS NOTE   S: No issues overnight.   O: EXAM:  BP 101/69 (BP Location: Right Arm)   Pulse 72   Temp 98.4 F (36.9 C) (Oral)   Resp 15   Ht 5\' 7"  (1.702 m)   Wt 97.6 kg   SpO2 96%   BMI 33.70 kg/m   Awake, alert, oriented x3 PERRL Speech fluent, appropriate  CNs grossly intact  5/5 BUE/BLE  C/d/I incision  ASSESSMENT:  70 y.o. male with   L5-S1 nonunion, s/p revision fusion Wound infection  PLAN: - cont pain control - abx per ID, PICC line pending - covid treatment    Thank you for allowing me to participate in this patient's care.  Please do not hesitate to call with questions or concerns.   78, DO Neurosurgeon [facility] Cell: [phone]

## 2021-09-12 ENCOUNTER — Inpatient Hospital Stay: Payer: Self-pay

## 2021-09-12 DIAGNOSIS — J9601 Acute respiratory failure with hypoxia: Secondary | ICD-10-CM | POA: Diagnosis not present

## 2021-09-12 DIAGNOSIS — A4101 Sepsis due to Methicillin susceptible Staphylococcus aureus: Secondary | ICD-10-CM | POA: Diagnosis not present

## 2021-09-12 DIAGNOSIS — R6520 Severe sepsis without septic shock: Secondary | ICD-10-CM | POA: Diagnosis not present

## 2021-09-12 LAB — CBC WITH DIFFERENTIAL/PLATELET
Abs Immature Granulocytes: 0.11 10*3/uL — ABNORMAL HIGH (ref 0.00–0.07)
Basophils Absolute: 0 10*3/uL (ref 0.0–0.1)
Basophils Relative: 0 %
Eosinophils Absolute: 0.1 10*3/uL (ref 0.0–0.5)
Eosinophils Relative: 1 %
HCT: 34.4 % — ABNORMAL LOW (ref 39.0–52.0)
Hemoglobin: 11.6 g/dL — ABNORMAL LOW (ref 13.0–17.0)
Immature Granulocytes: 1 %
Lymphocytes Relative: 19 %
Lymphs Abs: 2 10*3/uL (ref 0.7–4.0)
MCH: 29.5 pg (ref 26.0–34.0)
MCHC: 33.7 g/dL (ref 30.0–36.0)
MCV: 87.5 fL (ref 80.0–100.0)
Monocytes Absolute: 1 10*3/uL (ref 0.1–1.0)
Monocytes Relative: 10 %
Neutro Abs: 7.1 10*3/uL (ref 1.7–7.7)
Neutrophils Relative %: 69 %
Platelets: 291 10*3/uL (ref 150–400)
RBC: 3.93 MIL/uL — ABNORMAL LOW (ref 4.22–5.81)
RDW: 14.1 % (ref 11.5–15.5)
WBC: 10.2 10*3/uL (ref 4.0–10.5)
nRBC: 0 % (ref 0.0–0.2)

## 2021-09-12 LAB — COMPREHENSIVE METABOLIC PANEL
ALT: 29 U/L (ref 0–44)
AST: 43 U/L — ABNORMAL HIGH (ref 15–41)
Albumin: 2.2 g/dL — ABNORMAL LOW (ref 3.5–5.0)
Alkaline Phosphatase: 68 U/L (ref 38–126)
Anion gap: 8 (ref 5–15)
BUN: 25 mg/dL — ABNORMAL HIGH (ref 8–23)
CO2: 28 mmol/L (ref 22–32)
Calcium: 8.4 mg/dL — ABNORMAL LOW (ref 8.9–10.3)
Chloride: 98 mmol/L (ref 98–111)
Creatinine, Ser: 0.84 mg/dL (ref 0.61–1.24)
GFR, Estimated: >60 mL/min (ref >60)
Glucose, Bld: 110 mg/dL — ABNORMAL HIGH (ref 70–99)
Potassium: 3.8 mmol/L (ref 3.5–5.1)
Sodium: 134 mmol/L — ABNORMAL LOW (ref 135–145)
Total Bilirubin: 0.5 mg/dL (ref 0.3–1.2)
Total Protein: 5.3 g/dL — ABNORMAL LOW (ref 6.5–8.1)

## 2021-09-12 LAB — C-REACTIVE PROTEIN: CRP: 10 mg/dL — ABNORMAL HIGH (ref <1.0)

## 2021-09-12 LAB — FERRITIN: Ferritin: 203 ng/mL (ref 24–336)

## 2021-09-12 LAB — MAGNESIUM: Magnesium: 1.9 mg/dL (ref 1.7–2.4)

## 2021-09-12 LAB — PHOSPHORUS: Phosphorus: 3.6 mg/dL (ref 2.5–4.6)

## 2021-09-12 MED ORDER — CHLORHEXIDINE GLUCONATE CLOTH 2 % EX PADS
6.0000 | MEDICATED_PAD | Freq: Every day | CUTANEOUS | Status: DC
  Administered 2021-09-12 – 2021-09-14 (×3): 6 via TOPICAL

## 2021-09-12 MED ORDER — SODIUM CHLORIDE 0.9% FLUSH
10.0000 mL | Freq: Two times a day (BID) | INTRAVENOUS | Status: DC
  Administered 2021-09-12 – 2021-09-14 (×4): 10 mL

## 2021-09-12 MED ORDER — SODIUM CHLORIDE 0.9% FLUSH: 10.0000 mL | INTRAVENOUS | Status: DC | PRN

## 2021-09-12 NOTE — Progress Notes (Signed)
  Progress Note    Michael Hill   [id number]  DOB: April 03, 1951  DOA: 09/08/2021     4 Date of Service: 09/12/2021   Subjective:  No nausea no vomiting no fever no chills.  No chest pain no abdominal pain.  Had a rough night last night secondary to back pain.  Hospital Problems * Severe Sepsis due to methicillin susceptible Staphylococcus aureus (MSSA) with acute hypoxic respiratory failure without septic shock (HCC) Presents with hypotension tachycardia and hypoxia.  Meeting SIRS criteria. Infection in recent surgical site on his spine S/P I and D.  By neurosurgery. Blood cultures positive for MSSA. ID consulted. Echocardiogram negative for any vegetation.  Cardiology consult for TEE. PICC line inserted.  Blood cultures negative for 48 hours. Currently on IV cefazolin.  ID planning to add rifampin due to hardware being present.   Hypotension Blood pressure now normal.  Tolerating Lopressor.  COVID-19 virus infection Inflammatory markers are improving. Continue remdesivir and Decadron. See acute hypoxic respiratory failure.  Acute respiratory failure with hypoxia (HCC) Initially on 4 L of oxygen Saturating 90% 10/6.  Does not use oxygen at baseline. Currently on room air 94% Likely secondary to COVID infection as well as mild volume overload.. CT PE protocol negative for pulmonary embolism. Completed remdesivir 10/9.  On Decadron.  Continue for 5 more days.  Postoperative wound infection see sepsis  SVT (supraventricular tachycardia) (HCC) In the setting of acute infection and sepsis as well as COVID-19. Initiate Lopressor.  We will transition to Toprol-XL on discharge. Heart rate occasionally irregular likely MAT.  No evidence of A. fib so far.  Pedal edema Lower extremity Doppler negative for DVT.  Likely volume overload.  X1 IV Lasix.  Hyponatremia Corrected.  From poor p.o. intake.  Hypophosphatemia Currently replaced.  Elevated AST (SGOT) Likely  secondary to infection.  Potentially demand ischemia as well.  Monitor.  Postoperative anemia H&H stable.  Monitor.  Obesity (BMI 30.0-34.9) Placing the patient at high risk for poor outcome in the setting of COVID infection as well as wound infection.  Monitor. Body mass index is 33.7 kg/m.   HYPERCHOLESTEROLEMIA On Lipitor.  Continue.     Objective Vital signs were reviewed and unremarkable.  Vitals:   09/12/21 0401 09/12/21 0729 09/12/21 1110 09/12/21 1458  BP: 134/84 (!) 110/58 (!) 94/54 (!) 90/57  Pulse: 67 (!) 104 88 96  Resp: 14 17 20 20   Temp: 98.8 F (37.1 C) 97.8 F (36.6 C) 98.1 F (36.7 C) 98 F (36.7 C)  TempSrc: Oral Axillary Axillary Axillary  SpO2: 91% 92% 94% 93%  Weight:      Height:       97.6 kg  Exam General: Appear in mild distress, no Rash; Oral Mucosa Clear, moist. no Abnormal Neck Mass Or lumps, Conjunctiva normal  Cardiovascular: S1 and S2 Present, no Murmur, Respiratory: good respiratory effort, Bilateral Air entry present and CTA, no Crackles, no wheezes Abdomen: Bowel Sound present, Soft and no tenderness Extremities: no Pedal edema Neurology: alert and oriented to time, place, and person affect appropriate. no new focal deficit Gait not checked due to patient safety concerns   Labs / Other Information My review of labs, imaging, notes and other tests shows no new significant findings.     Time spent: 35 mins [facility] 09/12/2021, 7:41 PM

## 2021-09-12 NOTE — Progress Notes (Signed)
Peripherally Inserted Central Catheter Placement  The IV Nurse has discussed with the patient and/or persons authorized to consent for the patient, the purpose of this procedure and the potential benefits and risks involved with this procedure.  The benefits include less needle sticks, lab draws from the catheter, and the patient may be discharged home with the catheter. Risks include, but not limited to, infection, bleeding, blood clot (thrombus formation), and puncture of an artery; nerve damage and irregular heartbeat and possibility to perform a PICC exchange if needed/ordered by physician.  Alternatives to this procedure were also discussed.  Bard Power PICC patient education guide, fact sheet on infection prevention and patient information card has been provided to patient /or left at bedside.    PICC Placement Documentation  PICC Single Lumen 09/12/21 Right Basilic 38 cm 0 cm (Active)  Indication for Insertion or Continuance of Line Home intravenous therapies (PICC only) 09/12/21 1333  Exposed Catheter (cm) 0 cm 09/12/21 1333  Site Assessment Clean;Dry;Intact 09/12/21 1333  Line Status Flushed;Saline locked;Blood return noted 09/12/21 1333  Dressing Type Transparent 09/12/21 1333  Dressing Status Clean;Dry;Intact 09/12/21 1333  Antimicrobial disc in place? Yes 09/12/21 1333  Safety Lock Not Applicable 09/12/21 1333  Line Care Connections checked and tightened 09/12/21 1333  Line Adjustment (NICU/IV Team Only) No 09/12/21 1333  Dressing Intervention New dressing 09/12/21 1333  Dressing Change Due 09/19/21 09/12/21 1333       [name] 09/12/2021, 1:34 PM

## 2021-09-12 NOTE — Plan of Care (Signed)

## 2021-09-12 NOTE — Progress Notes (Signed)
   Providing Compassionate, Quality Care - Together  NEUROSURGERY PROGRESS NOTE   S: No issues overnight.   O: EXAM:  BP (!) 94/54 (BP Location: Left Wrist)   Pulse 88   Temp 98.1 F (36.7 C) (Axillary)   Resp 20   Ht 5\' 7"  (1.702 m)   Wt 97.6 kg   SpO2 94%   BMI 33.70 kg/m   Awake, alert, oriented x3 PERRL Speech fluent, appropriate  CNs grossly intact  5/5 BUE/BLE  C/d/I incision   ASSESSMENT:  70 y.o. male with    L5-S1 nonunion, s/p revision fusion Wound infection   PLAN: - cont pain control - abx per ID, PICC line pending - covid treatment       Thank you for allowing me to participate in this patient's care.  Please do not hesitate to call with questions or concerns.   70, DO Neurosurgeon [facility] Cell: [phone]

## 2021-09-13 DIAGNOSIS — J9601 Acute respiratory failure with hypoxia: Secondary | ICD-10-CM | POA: Diagnosis not present

## 2021-09-13 DIAGNOSIS — A4101 Sepsis due to Methicillin susceptible Staphylococcus aureus: Secondary | ICD-10-CM | POA: Diagnosis not present

## 2021-09-13 DIAGNOSIS — R6520 Severe sepsis without septic shock: Secondary | ICD-10-CM | POA: Diagnosis not present

## 2021-09-13 LAB — AEROBIC/ANAEROBIC CULTURE W GRAM STAIN (SURGICAL/DEEP WOUND): Gram Stain: NONE SEEN

## 2021-09-13 LAB — CBC WITH DIFFERENTIAL/PLATELET
Abs Immature Granulocytes: 0.24 10*3/uL — ABNORMAL HIGH (ref 0.00–0.07)
Basophils Absolute: 0 10*3/uL (ref 0.0–0.1)
Basophils Relative: 0 %
Eosinophils Absolute: 0.1 10*3/uL (ref 0.0–0.5)
Eosinophils Relative: 1 %
HCT: 35 % — ABNORMAL LOW (ref 39.0–52.0)
Hemoglobin: 11.4 g/dL — ABNORMAL LOW (ref 13.0–17.0)
Immature Granulocytes: 2 %
Lymphocytes Relative: 26 %
Lymphs Abs: 2.7 10*3/uL (ref 0.7–4.0)
MCH: 28.8 pg (ref 26.0–34.0)
MCHC: 32.6 g/dL (ref 30.0–36.0)
MCV: 88.4 fL (ref 80.0–100.0)
Monocytes Absolute: 1 10*3/uL (ref 0.1–1.0)
Monocytes Relative: 10 %
Neutro Abs: 6.2 10*3/uL (ref 1.7–7.7)
Neutrophils Relative %: 61 %
Platelets: 317 10*3/uL (ref 150–400)
RBC: 3.96 MIL/uL — ABNORMAL LOW (ref 4.22–5.81)
RDW: 14 % (ref 11.5–15.5)
WBC: 10.3 10*3/uL (ref 4.0–10.5)
nRBC: 0.2 % (ref 0.0–0.2)

## 2021-09-13 LAB — C-REACTIVE PROTEIN: CRP: 8.4 mg/dL — ABNORMAL HIGH (ref <1.0)

## 2021-09-13 LAB — COMPREHENSIVE METABOLIC PANEL
ALT: 24 U/L (ref 0–44)
AST: 27 U/L (ref 15–41)
Albumin: 2.4 g/dL — ABNORMAL LOW (ref 3.5–5.0)
Alkaline Phosphatase: 64 U/L (ref 38–126)
Anion gap: 10 (ref 5–15)
BUN: 24 mg/dL — ABNORMAL HIGH (ref 8–23)
CO2: 26 mmol/L (ref 22–32)
Calcium: 8.4 mg/dL — ABNORMAL LOW (ref 8.9–10.3)
Chloride: 99 mmol/L (ref 98–111)
Creatinine, Ser: 0.78 mg/dL (ref 0.61–1.24)
GFR, Estimated: >60 mL/min (ref >60)
Glucose, Bld: 118 mg/dL — ABNORMAL HIGH (ref 70–99)
Potassium: 3.9 mmol/L (ref 3.5–5.1)
Sodium: 135 mmol/L (ref 135–145)
Total Bilirubin: 0.9 mg/dL (ref 0.3–1.2)
Total Protein: 5.4 g/dL — ABNORMAL LOW (ref 6.5–8.1)

## 2021-09-13 MED ORDER — RIFAMPIN 300 MG PO CAPS
300.0000 mg | ORAL_CAPSULE | Freq: Two times a day (BID) | ORAL | Status: DC
  Administered 2021-09-13 – 2021-09-14 (×3): 300 mg via ORAL
  Filled 2021-09-13 (×4): qty 1

## 2021-09-13 MED ORDER — DEXAMETHASONE 4 MG PO TABS
6.0000 mg | ORAL_TABLET | Freq: Every day | ORAL | Status: DC
  Administered 2021-09-14: 6 mg via ORAL
  Filled 2021-09-13: qty 2

## 2021-09-13 MED ORDER — RIFAMPIN 300 MG PO CAPS: 300.0000 mg | ORAL_CAPSULE | Freq: Two times a day (BID) | ORAL | 0 refills | Status: DC

## 2021-09-13 MED ORDER — CEFAZOLIN IV (FOR PTA / DISCHARGE USE ONLY): 2.0000 g | Freq: Three times a day (TID) | INTRAVENOUS | 0 refills | Status: DC

## 2021-09-13 NOTE — Progress Notes (Addendum)
PHARMACY CONSULT NOTE FOR:  OUTPATIENT  PARENTERAL ANTIBIOTIC THERAPY (OPAT)  Indication: MSSA Bacteremia/Lumbar Infection Regimen: Cefazolin 2g IV q8h Rifampin 300 mg PO BID End date: 10/20/21  IV antibiotic discharge orders are pended. To discharging provider:  please sign these orders via discharge navigator,  Select New Orders & click on the button choice - Manage This Unsigned Work.     Thank you for allowing pharmacy to be a part of this patient's care.  Shirlee More, PharmD PGY2 Infectious Diseases Pharmacy Resident   Please check [email or web address] for unit-specific pharmacy phone numbers

## 2021-09-13 NOTE — H&P (View-Only) (Signed)
  Progress Note    Michael Hill   MRN:[id number]  DOB: 04/21/1951  DOA: 09/08/2021     5 Date of Service: 09/13/2021  Subjective:  No nausea no vomiting no fever no chills.  No chest pain.  No abdominal pain.  Still has swelling in the leg.  Oral intake improving.  Pain well controlled for now.  Hospital Problems * Severe Sepsis due to methicillin susceptible Staphylococcus aureus (MSSA) with acute hypoxic respiratory failure without septic shock (HCC) Presents with hypotension tachycardia and hypoxia.  Meeting SIRS criteria. Infection in recent surgical site on his spine S/P I and D.  By neurosurgery. Blood cultures positive for MSSA. ID consulted. Echocardiogram negative for any vegetation.  Cardiology consult for TEE. PICC line inserted.  Blood cultures negative for 48 hours. Currently on IV cefazolin.  Rifampin added.  OPAT orders signed.  Hypotension Blood pressure now normal.  Tolerating Lopressor.  COVID-19 virus infection Inflammatory markers are improving. Continue remdesivir and Decadron. See acute hypoxic respiratory failure.  Acute respiratory failure with hypoxia (HCC) Initially on 4 L of oxygen Saturating 90% 10/6.  Does not use oxygen at baseline. Currently on room air 94% Likely secondary to COVID infection as well as mild volume overload.. CT PE protocol negative for pulmonary embolism. Completed remdesivir 10/9.  On Decadron.  Continue for 5 more days.  Postoperative wound infection see sepsis  SVT (supraventricular tachycardia) (HCC) In the setting of acute infection and sepsis as well as COVID-19. Initiate Lopressor.  We will transition to Toprol-XL on discharge. Heart rate occasionally irregular likely MAT.  No evidence of A. fib so far.  Pedal edema Lower extremity Doppler negative for DVT.  Swelling improving but still present. Blood pressure soft therefore will provide compression stockings.  Hyponatremia Corrected.  From poor p.o.  intake.  Hypophosphatemia Currently replaced.  Elevated AST (SGOT) Likely secondary to infection.  Potentially demand ischemia as well.  Monitor.  Postoperative anemia H&H stable.  Monitor.  Obesity (BMI 30.0-34.9) Placing the patient at high risk for poor outcome in the setting of COVID infection as well as wound infection.  Monitor. Body mass index is 33.7 kg/m.   HYPERCHOLESTEROLEMIA On Lipitor.  Continue.     Objective Vital signs were reviewed and unremarkable.  Vitals:   09/13/21 0411 09/13/21 0828 09/13/21 1208 09/13/21 1534  BP: 108/65 113/71 (!) 90/56 110/76  Pulse: 85 82 89 68  Resp: 14 19 18 15  Temp: 98.6 F (37 C) 97.7 F (36.5 C) 98 F (36.7 C) 97.9 F (36.6 C)  TempSrc: Oral Oral Axillary Oral  SpO2: 90% 95% 94% 95%  Weight:      Height:       97.6 kg  Exam General: Appear in mild distress, no Rash; Oral Mucosa Clear, moist. no Abnormal Neck Mass Or lumps, Conjunctiva normal  Cardiovascular: S1 and S2 Present, no Murmur, Respiratory: good respiratory effort, Bilateral Air entry present and CTA, no Crackles, no wheezes Abdomen: Bowel Sound present, Soft and no tenderness Extremities: bilateral Pedal edema Neurology: alert and oriented to time, place, and person affect appropriate. no new focal deficit Gait not checked due to patient safety concerns   Labs / Other Information My review of labs, imaging, notes and other tests shows no new significant findings.     Time spent: 35 mins  [facility] 09/13/2021, 8:28 PM   

## 2021-09-13 NOTE — Progress Notes (Signed)
  Progress Note    Michael Hill   TGG:[id number]  DOB: 03/18/1951  DOA: 09/08/2021     5 Date of Service: 09/13/2021  Subjective:  No nausea no vomiting no fever no chills.  No chest pain.  No abdominal pain.  Still has swelling in the leg.  Oral intake improving.  Pain well controlled for now.  Hospital Problems * Severe Sepsis due to methicillin susceptible Staphylococcus aureus (MSSA) with acute hypoxic respiratory failure without septic shock (HCC) Presents with hypotension tachycardia and hypoxia.  Meeting SIRS criteria. Infection in recent surgical site on his spine S/P I and D.  By neurosurgery. Blood cultures positive for MSSA. ID consulted. Echocardiogram negative for any vegetation.  Cardiology consult for TEE. PICC line inserted.  Blood cultures negative for 48 hours. Currently on IV cefazolin.  Rifampin added.  OPAT orders signed.  Hypotension Blood pressure now normal.  Tolerating Lopressor.  COVID-19 virus infection Inflammatory markers are improving. Continue remdesivir and Decadron. See acute hypoxic respiratory failure.  Acute respiratory failure with hypoxia (HCC) Initially on 4 L of oxygen Saturating 90% 10/6.  Does not use oxygen at baseline. Currently on room air 94% Likely secondary to COVID infection as well as mild volume overload.. CT PE protocol negative for pulmonary embolism. Completed remdesivir 10/9.  On Decadron.  Continue for 5 more days.  Postoperative wound infection see sepsis  SVT (supraventricular tachycardia) (HCC) In the setting of acute infection and sepsis as well as COVID-19. Initiate Lopressor.  We will transition to Toprol-XL on discharge. Heart rate occasionally irregular likely MAT.  No evidence of A. fib so far.  Pedal edema Lower extremity Doppler negative for DVT.  Swelling improving but still present. Blood pressure soft therefore will provide compression stockings.  Hyponatremia Corrected.  From poor p.o.  intake.  Hypophosphatemia Currently replaced.  Elevated AST (SGOT) Likely secondary to infection.  Potentially demand ischemia as well.  Monitor.  Postoperative anemia H&H stable.  Monitor.  Obesity (BMI 30.0-34.9) Placing the patient at high risk for poor outcome in the setting of COVID infection as well as wound infection.  Monitor. Body mass index is 33.7 kg/m.   HYPERCHOLESTEROLEMIA On Lipitor.  Continue.     Objective Vital signs were reviewed and unremarkable.  Vitals:   09/13/21 0411 09/13/21 0828 09/13/21 1208 09/13/21 1534  BP: 108/65 113/71 (!) 90/56 110/76  Pulse: 85 82 89 68  Resp: 14 19 18 15   Temp: 98.6 F (37 C) 97.7 F (36.5 C) 98 F (36.7 C) 97.9 F (36.6 C)  TempSrc: Oral Oral Axillary Oral  SpO2: 90% 95% 94% 95%  Weight:      Height:       97.6 kg  Exam General: Appear in mild distress, no Rash; Oral Mucosa Clear, moist. no Abnormal Neck Mass Or lumps, Conjunctiva normal  Cardiovascular: S1 and S2 Present, no Murmur, Respiratory: good respiratory effort, Bilateral Air entry present and CTA, no Crackles, no wheezes Abdomen: Bowel Sound present, Soft and no tenderness Extremities: bilateral Pedal edema Neurology: alert and oriented to time, place, and person affect appropriate. no new focal deficit Gait not checked due to patient safety concerns   Labs / Other Information My review of labs, imaging, notes and other tests shows no new significant findings.     Time spent: 35 mins  [facility] 09/13/2021, 8:28 PM

## 2021-09-13 NOTE — Progress Notes (Signed)
Subjective: Patient reports feeling better  Objective: Vital signs in last 24 hours: Temp:  [97.7 F (36.5 C)-98.6 F (37 C)] 97.7 F (36.5 C) (10/10 0828) Pulse Rate:  [61-96] 82 (10/10 0828) Resp:  [14-21] 19 (10/10 0828) BP: (83-117)/(47-81) 113/71 (10/10 0828) SpO2:  [85 %-95 %] 95 % (10/10 0828)  Intake/Output from previous day: 10/09 0701 - 10/10 0700 In: 800 [IV Piggyback:800] Out: 350 [Urine:350] Intake/Output this shift: No intake/output data recorded.  Physical Exam: Back dressing CDI.  No complaints of leg pain or weakness.  Lab Results: Recent Labs    09/12/21 0431 09/13/21 0455  WBC 10.2 10.3  HGB 11.6* 11.4*  HCT 34.4* 35.0*  PLT 291 317   BMET Recent Labs    09/12/21 0431 09/13/21 0455  NA 134* 135  K 3.8 3.9  CL 98 99  CO2 28 26  GLUCOSE 110* 118*  BUN 25* 24*  CREATININE 0.84 0.78  CALCIUM 8.4* 8.4*    Studies/Results: [place] EKG SITE RITE  Result Date: 09/12/2021 If Site Rite image not attached, placement could not be confirmed due to current cardiac rhythm.   Assessment/Plan: TEE today.  IF negative, likely home soon on IV ABX with PICC.    LOS: 5 days    Dorian Heckle, MD 09/13/2021, 8:38 AM

## 2021-09-13 NOTE — Progress Notes (Addendum)
I have seen and examined the patient. I have personally reviewed the clinical findings, laboratory findings, microbiological data and imaging studies. The assessment and treatment plan was discussed with the Nurse Practitioner Janene Madeira. I agree with her/his recommendations except following additions/corrections.  MSSA bacteremia 2/2 Lumbar wound infection involving hardware. Blood cultures 10/7 no growth in 3 days. TTE no vegetations. TEE is planned for 10/11 ( would prefer to have a TEE even though this will not change length of tx given chest xray findings with Pul edema)  Continue cefazolin as is, 6 weeks from date of negative blood cultures on 10/7. See OPAT orders below Will add Rifampin today for hardware May need to continue oral antibiotics post completion of 6 weeks of IV abtx given involvement of hardware ( can be done on OP fu) PICC line in place Will follow up TEE peripherally Monitor CBC and BMP  COVID 19 ( mild symptoms) , management per primary  COVID 19 Isolation precautions per IP   Rosiland Oz, MD Infectious Disease Physician [facility] [address] [place], [place] [address] Phone: [phone]  Fax: [place] for Infectious Disease  Date of Admission:  09/08/2021      Total days of antibiotics 5   Cefazolin 10/05 >> current             ASSESSMENT: Michael Hill is a 70 y.o. male with MSSA bacteremia secondary to surgical site infection from recent lumbar surgery. S/P debridement (10/5).   Bacteremia - MSSA. PICC line has been placed. OPAT orders as outlined below. TEE scheduled 10/11 to rule out endocarditis better than transthoracic option. Continue cefazolin.   COVID - hypoxic at presentation with abnormal chest xray; now s/p dex and remdesivir. Improved and off oxygen.   Hardware complicating wound - add rifampin today now that bacteremia is cleared. Monitor  CMP. No significant drug interactions.     PLAN: Continue cefazolin - OPAT as outlined below TEE pending 10/11  Add rifampin 300 mg BID    OPAT ORDERS:  Diagnosis: W4/X3 wound complicated by hardware and secondary bacteremia  Culture Result: MSSA  No Known Allergies   Discharge antibiotics to be given via PICC line:  Per pharmacy protocol CEFAZOLIN 2gm IV TID  +  PO Rifampin 300 mg BID    Duration: 6 weeks   End Date: 10/22/2021  [facility] Care Per Protocol with [employer] Use: Home health RN for IV administration and teaching, line care and labs.    Labs weekly while on IV antibiotics: _x_ CBC with differential _X_ BMP __ CMP _x_ CRP _x_ ESR __ Vancomycin trough __ CK  _x_ Please pull PIC at completion of IV antibiotics __ Please leave PIC in place until doctor has seen patient or been notified  Fax weekly labs to [phone]  Clinic Follow Up Appt: 10/25 @ 9:45 am with Dr. Linus Salmons     Principal Problem:   Severe Sepsis due to methicillin susceptible Staphylococcus aureus (MSSA) with acute hypoxic respiratory failure without septic shock ([place]) Active Problems:   HYPERCHOLESTEROLEMIA   Postoperative wound infection   Acute respiratory failure with hypoxia ([place])   COVID-19 virus infection   Hypotension   Postoperative anemia   Elevated AST (SGOT)   Obesity (BMI 30.0-34.9)   Hyponatremia   Pedal edema   SVT (supraventricular tachycardia) (HCC)   Hypophosphatemia    vitamin C  500 mg Oral  Daily   atorvastatin  20 mg Oral Daily   Chlorhexidine Gluconate Cloth  6 each Topical Daily   dexamethasone (DECADRON) injection  6 mg Intravenous Q24H   docusate sodium  100 mg Oral BID   enoxaparin (LOVENOX) injection  40 mg Subcutaneous Q24H   famotidine  20 mg Oral Daily   metoprolol tartrate  25 mg Oral BID   oxyCODONE  10 mg Oral Q12H   pantoprazole  40 mg Oral Daily   rifampin  300 mg Oral Q12H   sodium chloride flush  10-40 mL Intracatheter Q12H    zinc sulfate  220 mg Oral Daily    SUBJECTIVE: Not sure how to answer the question "how are you feeling today" No longer requiring oxygen. Frustrated hearing the TEE is scheduled for 10/10.    Review of Systems: Review of Systems  Constitutional:  Negative for chills, fever, malaise/fatigue and weight loss.  HENT:  Negative for sore throat.   Respiratory:  Positive for cough (dry). Negative for sputum production.   Cardiovascular:  Negative for chest pain and leg swelling.  Gastrointestinal:  Negative for abdominal pain, diarrhea and vomiting.  Genitourinary:  Negative for dysuria and flank pain.  Musculoskeletal:  Positive for back pain. Negative for joint pain, myalgias and neck pain.  Skin:  Negative for rash.  Neurological:  Negative for dizziness, tingling and headaches.  Psychiatric/Behavioral:  Negative for depression and substance abuse. The patient is not nervous/anxious and does not have insomnia.     No Known Allergies  OBJECTIVE: Vitals:   09/12/21 2150 09/12/21 2300 09/13/21 0411 09/13/21 0828  BP: (!) 98/47 (!) 110/51 108/65 113/71  Pulse:  61 85 82  Resp:  $Remo'16 14 19  'XasDz$ Temp:  98 F (36.7 C) 98.6 F (37 C) 97.7 F (36.5 C)  TempSrc:  Oral Oral Oral  SpO2:  91% 90% 95%  Weight:      Height:       Body mass index is 33.7 kg/m.  Physical Exam Vitals reviewed.  Constitutional:      Appearance: He is well-developed.     Comments: Resting quietly in bed watching a movie  HENT:     Mouth/Throat:     Dentition: Normal dentition. No dental abscesses.  Cardiovascular:     Rate and Rhythm: Normal rate. Rhythm irregular.     Heart sounds: Normal heart sounds.     Comments: Afib on telemetry  Pulmonary:     Effort: Pulmonary effort is normal.     Breath sounds: Normal breath sounds.  Abdominal:     General: There is no distension.     Palpations: Abdomen is soft.     Tenderness: There is no abdominal tenderness.  Musculoskeletal:     Comments: Lumbar wound  with clean dry bandage   Lymphadenopathy:     Cervical: No cervical adenopathy.  Skin:    General: Skin is warm and dry.     Findings: No rash.  Neurological:     Mental Status: He is alert and oriented to person, place, and time.  Psychiatric:        Judgment: Judgment normal.    Lab Results Lab Results  Component Value Date   WBC 10.3 09/13/2021   HGB 11.4 (L) 09/13/2021   HCT 35.0 (L) 09/13/2021   MCV 88.4 09/13/2021   PLT 317 09/13/2021    Lab Results  Component Value Date   CREATININE 0.78 09/13/2021   BUN 24 (H) 09/13/2021   NA  135 09/13/2021   K 3.9 09/13/2021   CL 99 09/13/2021   CO2 26 09/13/2021    Lab Results  Component Value Date   ALT 24 09/13/2021   AST 27 09/13/2021   ALKPHOS 64 09/13/2021   BILITOT 0.9 09/13/2021     Microbiology: Recent Results (from the past 240 hour(s))  Resp Panel by RT-PCR (Flu A&B, Covid) Nasopharyngeal Swab     Status: Abnormal   Collection Time: 09/08/21  4:53 AM   Specimen: Nasopharyngeal Swab; Nasopharyngeal(NP) swabs in vial transport medium  Result Value Ref Range Status   SARS Coronavirus 2 by RT PCR POSITIVE (A) NEGATIVE Final    Comment: RESULT CALLED TO, READ BACK BY AND VERIFIED WITH: BESSMAN,RN@0712  09/08/21 [place] (NOTE) SARS-CoV-2 target nucleic acids are DETECTED.  The SARS-CoV-2 RNA is generally detectable in upper respiratory specimens during the acute phase of infection. Positive results are indicative of the presence of the identified virus, but do not rule out bacterial infection or co-infection with other pathogens not detected by the test. Clinical correlation with patient history and other diagnostic information is necessary to determine patient infection status. The expected result is Negative.  Fact Sheet for Patients: EntrepreneurPulse.com.au  Fact Sheet for Healthcare Providers: IncredibleEmployment.be  This test is not yet approved or cleared by the [place] FDA and  has been authorized for detection and/or diagnosis of SARS-CoV-2 by FDA under an Emergency Use Authorization (EUA).  This EUA will remain in effect (meaning this test can be used)  for the duration of  the COVID-19 declaration under Section 564(b)(1) of the Act, 21 U.S.C. section 360bbb-3(b)(1), unless the authorization is terminated or revoked sooner.     Influenza A by PCR NEGATIVE NEGATIVE Final   Influenza B by PCR NEGATIVE NEGATIVE Final    Comment: (NOTE) The Xpert Xpress SARS-CoV-2/FLU/RSV plus assay is intended as an aid in the diagnosis of influenza from Nasopharyngeal swab specimens and should not be used as a sole basis for treatment. Nasal washings and aspirates are unacceptable for Xpert Xpress SARS-CoV-2/FLU/RSV testing.  Fact Sheet for Patients: EntrepreneurPulse.com.au  Fact Sheet for Healthcare Providers: IncredibleEmployment.be  This test is not yet approved or cleared by the [place] FDA and has been authorized for detection and/or diagnosis of SARS-CoV-2 by FDA under an Emergency Use Authorization (EUA). This EUA will remain in effect (meaning this test can be used) for the duration of the COVID-19 declaration under Section 564(b)(1) of the Act, 21 U.S.C. section 360bbb-3(b)(1), unless the authorization is terminated or revoked.  Performed at [facility], [place] [address], [place], [place] [address]   Blood culture (routine x 2)     Status: Abnormal   Collection Time: 09/08/21  5:36 AM   Specimen: BLOOD  Result Value Ref Range Status   Specimen Description BLOOD LEFT ANTECUBITAL  Final   Special Requests   Final    BOTTLES DRAWN AEROBIC AND ANAEROBIC Blood Culture adequate volume   Culture  Setup Time   Final    GRAM POSITIVE COCCI IN CLUSTERS IN BOTH AEROBIC AND ANAEROBIC BOTTLES CRITICAL RESULT CALLED TO, READ BACK BY AND VERIFIED WITH: PHARMD ANDREW MAYER 10/5/2 @1843  BY JW Performed at  [facility], [place] [address], [place], [place] [address]    Culture STAPHYLOCOCCUS AUREUS (A)  Final   Report Status 09/10/2021 FINAL  Final   Organism ID, Bacteria STAPHYLOCOCCUS AUREUS  Final      Susceptibility   Staphylococcus aureus - MIC*  CIPROFLOXACIN <=0.5 SENSITIVE Sensitive     ERYTHROMYCIN <=0.25 SENSITIVE Sensitive     GENTAMICIN <=0.5 SENSITIVE Sensitive     OXACILLIN 0.5 SENSITIVE Sensitive     TETRACYCLINE <=1 SENSITIVE Sensitive     VANCOMYCIN <=0.5 SENSITIVE Sensitive     TRIMETH/SULFA <=10 SENSITIVE Sensitive     CLINDAMYCIN <=0.25 SENSITIVE Sensitive     RIFAMPIN <=0.5 SENSITIVE Sensitive     Inducible Clindamycin NEGATIVE Sensitive     * STAPHYLOCOCCUS AUREUS  Blood Culture ID Panel (Reflexed)     Status: Abnormal   Collection Time: 09/08/21  5:36 AM  Result Value Ref Range Status   Enterococcus faecalis NOT DETECTED NOT DETECTED Final   Enterococcus Faecium NOT DETECTED NOT DETECTED Final   Listeria monocytogenes NOT DETECTED NOT DETECTED Final   Staphylococcus species DETECTED (A) NOT DETECTED Final    Comment: CRITICAL RESULT CALLED TO, READ BACK BY AND VERIFIED WITH: PHARMD ANDREW MAYER 10/5/2 $RemoveBefo'@1843'[id number]$  BY JW    Staphylococcus aureus (BCID) DETECTED (A) NOT DETECTED Final    Comment: CRITICAL RESULT CALLED TO, READ BACK BY AND VERIFIED WITH: PHARMD ANDREW MAYER 10/5/2 $RemoveBefo'@1843'[id number]$  BY JW    Staphylococcus epidermidis NOT DETECTED NOT DETECTED Final   Staphylococcus lugdunensis NOT DETECTED NOT DETECTED Final   Streptococcus species NOT DETECTED NOT DETECTED Final   Streptococcus agalactiae NOT DETECTED NOT DETECTED Final   Streptococcus pneumoniae NOT DETECTED NOT DETECTED Final   Streptococcus pyogenes NOT DETECTED NOT DETECTED Final   A.calcoaceticus-baumannii NOT DETECTED NOT DETECTED Final   Bacteroides fragilis NOT DETECTED NOT DETECTED Final   Enterobacterales NOT DETECTED NOT DETECTED Final   Enterobacter cloacae complex NOT DETECTED NOT DETECTED  Final   Escherichia coli NOT DETECTED NOT DETECTED Final   Klebsiella aerogenes NOT DETECTED NOT DETECTED Final   Klebsiella oxytoca NOT DETECTED NOT DETECTED Final   Klebsiella pneumoniae NOT DETECTED NOT DETECTED Final   Proteus species NOT DETECTED NOT DETECTED Final   Salmonella species NOT DETECTED NOT DETECTED Final   Serratia marcescens NOT DETECTED NOT DETECTED Final   Haemophilus influenzae NOT DETECTED NOT DETECTED Final   Neisseria meningitidis NOT DETECTED NOT DETECTED Final   Pseudomonas aeruginosa NOT DETECTED NOT DETECTED Final   Stenotrophomonas maltophilia NOT DETECTED NOT DETECTED Final   Candida albicans NOT DETECTED NOT DETECTED Final   Candida auris NOT DETECTED NOT DETECTED Final   Candida glabrata NOT DETECTED NOT DETECTED Final   Candida krusei NOT DETECTED NOT DETECTED Final   Candida parapsilosis NOT DETECTED NOT DETECTED Final   Candida tropicalis NOT DETECTED NOT DETECTED Final   Cryptococcus neoformans/gattii NOT DETECTED NOT DETECTED Final   Meth resistant mecA/C and MREJ NOT DETECTED NOT DETECTED Final    Comment: Performed at [facility], [address] [place], [place] [address]  Blood culture (routine x 2)     Status: Abnormal   Collection Time: 09/08/21  5:41 AM   Specimen: BLOOD  Result Value Ref Range Status   Specimen Description BLOOD RIGHT ANTECUBITAL  Final   Special Requests   Final    BOTTLES DRAWN AEROBIC AND ANAEROBIC Blood Culture adequate volume   Culture  Setup Time   Final    GRAM POSITIVE COCCI IN CLUSTERS IN BOTH AEROBIC AND ANAEROBIC BOTTLES CRITICAL VALUE NOTED.  VALUE IS CONSISTENT WITH PREVIOUSLY REPORTED AND CALLED VALUE.    Culture (A)  Final    STAPHYLOCOCCUS AUREUS SUSCEPTIBILITIES PERFORMED ON PREVIOUS CULTURE WITHIN THE LAST 5 DAYS. Performed at [facility], [place] [address] [place], [place] [address]    Report Status 09/10/2021 FINAL  Final  Aerobic/Anaerobic Culture w Gram Stain (surgical/deep wound)      Status: None (Preliminary result)   Collection Time: 09/08/21 12:25 PM   Specimen: Wound  Result Value Ref Range Status   Specimen Description WOUND  Final   Special Requests  UKN  Final   Gram Stain   Final    NO WBC SEEN NO ORGANISMS SEEN Performed at [facility], [place] [address] [place], [place] [address]    Culture   Final    FEW STAPHYLOCOCCUS AUREUS NO ANAEROBES ISOLATED; CULTURE IN PROGRESS FOR 5 DAYS    Report Status PENDING  Incomplete   Organism ID, Bacteria STAPHYLOCOCCUS AUREUS  Final      Susceptibility   Staphylococcus aureus - MIC*    CIPROFLOXACIN <=0.5 SENSITIVE Sensitive     ERYTHROMYCIN <=0.25 SENSITIVE Sensitive     GENTAMICIN <=0.5 SENSITIVE Sensitive     OXACILLIN 0.5 SENSITIVE Sensitive     TETRACYCLINE <=1 SENSITIVE Sensitive     VANCOMYCIN <=0.5 SENSITIVE Sensitive     TRIMETH/SULFA <=10 SENSITIVE Sensitive     CLINDAMYCIN <=0.25 SENSITIVE Sensitive     RIFAMPIN <=0.5 SENSITIVE Sensitive     Inducible Clindamycin NEGATIVE Sensitive     * FEW STAPHYLOCOCCUS AUREUS  Aerobic/Anaerobic Culture w Gram Stain (surgical/deep wound)     Status: None (Preliminary result)   Collection Time: 09/08/21  4:44 PM   Specimen: Wound  Result Value Ref Range Status   Specimen Description WOUND  Final   Special Requests LUMBAR NO 1  Final   Gram Stain   Final    NO ORGANISMS SEEN SQUAMOUS EPITHELIAL CELLS PRESENT FEW WBC PRESENT, PREDOMINANTLY MONONUCLEAR MODERATE GRAM POSITIVE COCCI Performed at [facility], [address], [place], [place] [address]    Culture   Final    ABUNDANT STAPHYLOCOCCUS AUREUS NO ANAEROBES ISOLATED; CULTURE IN PROGRESS FOR 5 DAYS    Report Status PENDING  Incomplete   Organism ID, Bacteria STAPHYLOCOCCUS AUREUS  Final      Susceptibility   Staphylococcus aureus - MIC*    CIPROFLOXACIN <=0.5 SENSITIVE Sensitive     ERYTHROMYCIN <=0.25 SENSITIVE Sensitive     GENTAMICIN <=0.5 SENSITIVE Sensitive     OXACILLIN 0.5  SENSITIVE Sensitive     TETRACYCLINE <=1 SENSITIVE Sensitive     VANCOMYCIN <=0.5 SENSITIVE Sensitive     TRIMETH/SULFA <=10 SENSITIVE Sensitive     CLINDAMYCIN <=0.25 SENSITIVE Sensitive     RIFAMPIN <=0.5 SENSITIVE Sensitive     Inducible Clindamycin NEGATIVE Sensitive     * ABUNDANT STAPHYLOCOCCUS AUREUS  Aerobic/Anaerobic Culture w Gram Stain (surgical/deep wound)     Status: None (Preliminary result)   Collection Time: 09/08/21  4:57 PM   Specimen: Wound  Result Value Ref Range Status   Specimen Description WOUND  Final   Special Requests LUMBAR NO 2  Final   Gram Stain   Final    NO ORGANISMS SEEN SQUAMOUS EPITHELIAL CELLS PRESENT FEW WBC PRESENT, PREDOMINANTLY MONONUCLEAR ABUNDANT GRAM POSITIVE COCCI Performed at [facility], [address] [place], [place] [address]    Culture   Final    ABUNDANT STAPHYLOCOCCUS AUREUS NO ANAEROBES ISOLATED; CULTURE IN PROGRESS FOR 5 DAYS    Report Status PENDING  Incomplete   Organism ID, Bacteria STAPHYLOCOCCUS AUREUS  Final      Susceptibility   Staphylococcus aureus -  MIC*    CIPROFLOXACIN <=0.5 SENSITIVE Sensitive     ERYTHROMYCIN <=0.25 SENSITIVE Sensitive     GENTAMICIN <=0.5 SENSITIVE Sensitive     OXACILLIN 0.5 SENSITIVE Sensitive     TETRACYCLINE <=1 SENSITIVE Sensitive     VANCOMYCIN <=0.5 SENSITIVE Sensitive     TRIMETH/SULFA <=10 SENSITIVE Sensitive     CLINDAMYCIN <=0.25 SENSITIVE Sensitive     RIFAMPIN <=0.5 SENSITIVE Sensitive     Inducible Clindamycin NEGATIVE Sensitive     * ABUNDANT STAPHYLOCOCCUS AUREUS  Culture, blood (routine x 2)     Status: None (Preliminary result)   Collection Time: 09/10/21  5:45 AM   Specimen: BLOOD  Result Value Ref Range Status   Specimen Description BLOOD LEFT ANTECUBITAL  Final   Special Requests AEROBIC BOTTLE ONLY Blood Culture adequate volume  Final   Culture   Final    NO GROWTH 3 DAYS Performed at [facility], [place] [address], [place], [place] [address]     Report Status PENDING  Incomplete  Culture, blood (routine x 2)     Status: None (Preliminary result)   Collection Time: 09/10/21  5:46 AM   Specimen: BLOOD  Result Value Ref Range Status   Specimen Description BLOOD RIGHT ANTECUBITAL  Final   Special Requests AEROBIC BOTTLE ONLY Blood Culture adequate volume  Final   Culture   Final    NO GROWTH 3 DAYS Performed at [facility], [place] [address], [place], [place] [address]    Report Status PENDING  Incomplete    Janene Madeira, MSN, NP-C [facility] [place] Pager: [phone] Secure Chat via Epic Preferred   09/13/2021  12:02 PM

## 2021-09-13 NOTE — TOC Initial Note (Addendum)
Transition of Care [facility]) - Initial/Assessment Note    Patient Details  Name: Michael Hill MRN: [id number] Date of Birth: [birth year]  [facility] CM/SW Contact:    Harriet Masson, RN Phone Number: 09/13/2021, 3:54 PM  Clinical Narrative:              Order for Home health RN for IV antibiotics. Choice offered by phone. Reviewed list Per CMS guidelines from medicare.gov website with star ratings (copy placed in shadow chart). Patient deferred to CM to make choice. Spoke to [place] at [place] and referral accepted for [facility] RN. Pam with advanced home infusion ([facility]) will follow and coordinate with [facility] for home iv antibiotics. Patient stated he has 2 children that are nurses that can also help with care.    Expected Discharge Plan: Home w Home Health Services Barriers to Discharge: Continued Medical Work up   Patient Goals and CMS Choice Patient states their goals for this hospitalization and ongoing recovery are:: return home CMS Medicare.gov Compare Post Acute Care list provided to:: Patient Choice offered to / list presented to : Patient  Expected Discharge Plan and Services Expected Discharge Plan: Home w Home Health Services   Discharge Planning Services: CM Consult Post Acute Care Choice: Home Health Living arrangements for the past 2 months: Single Family Home                           HH Arranged: RN HH Agency: [facility] Date [facility] Agency Contacted: 09/13/21 Time HH Agency Contacted: 1535 Representative spoke with at [facility] Agency: Kandee Keen  Prior Living Arrangements/Services Living arrangements for the past 2 months: Single Family Home Lives with:: Spouse Patient language and need for interpreter reviewed:: Yes Do you feel safe going back to the place where you live?: Yes      Need for Family Participation in Patient Care: Yes (Comment) Care giver support system in place?: Yes (comment)   Criminal Activity/Legal Involvement Pertinent to  Current Situation/Hospitalization: No - Comment as needed  Activities of Daily Living Home Assistive Devices/Equipment: Blood pressure cuff, Scales, Eyeglasses, Grab bars in shower, Grab bars around toilet ADL Screening (condition at time of admission) Patient's cognitive ability adequate to safely complete daily activities?: Yes Is the patient deaf or have difficulty hearing?: No Does the patient have difficulty seeing, even when wearing glasses/contacts?: No Does the patient have difficulty concentrating, remembering, or making decisions?: No Patient able to express need for assistance with ADLs?: Yes Does the patient have difficulty dressing or bathing?: No Independently performs ADLs?: Yes (appropriate for developmental age) Does the patient have difficulty walking or climbing stairs?: Yes Weakness of Legs: None Weakness of Arms/Hands: None  Permission Sought/Granted Permission sought to share information with : Facility Industrial/product designer granted to share information with : Yes, Verbal Permission Granted     Permission granted to share info w AGENCY: hme health        Emotional Assessment Appearance:: Appears older than stated age Attitude/Demeanor/Rapport: Engaged Affect (typically observed): Accepting Orientation: : Oriented to Self, Oriented to Place, Oriented to  Time, Oriented to Situation   Psych Involvement: No (comment)  Admission diagnosis:  Shortness of breath [R06.02] SIRS (systemic inflammatory response syndrome) (HCC) [R65.10] Wound infection after surgery [T81.49XA] Patient Active Problem List   Diagnosis Date Noted   Pedal edema 09/10/2021   SVT (supraventricular tachycardia) (HCC) 09/10/2021   Hypophosphatemia 09/10/2021   Severe Sepsis due to  methicillin susceptible Staphylococcus aureus (MSSA) with acute hypoxic respiratory failure without septic shock (HCC) 09/09/2021   Hyponatremia 09/09/2021   Postoperative wound infection 09/08/2021    Acute respiratory failure with hypoxia (HCC) 09/08/2021   COVID-19 virus infection 09/08/2021   Hypotension 09/08/2021   Postoperative anemia 09/08/2021   Elevated AST (SGOT) 09/08/2021   Obesity (BMI 30.0-34.9) 09/08/2021   Lumbar pseudoarthrosis 08/24/2021   Lumbar spine scoliosis 08/05/2014   DDD (degenerative disc disease), lumbar 07/23/2014   HYPERCHOLESTEROLEMIA 02/01/2007   RHINITIS, ALLERGIC 02/01/2007   DJD, 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 [place] [address] Phone: [phone] Fax: [phone]     Social Determinants of Health (SDOH) Interventions    Readmission Risk Interventions Readmission Risk Prevention Plan 09/13/2021 09/13/2021  Transportation Screening - Complete  HRI or Home Care Consult - Complete  Social Work Consult for Recovery Care Planning/Counseling Complete -  Palliative Care Screening Not Applicable Not Applicable  Medication Review Oceanographer) Complete Complete  Some recent data might be hidden

## 2021-09-13 NOTE — Progress Notes (Signed)
PT Cancellation/Discharge Note  Patient Details Name: Michael Hill MRN: [id number] DOB: October 15, 1951   Cancelled Treatment:    Reason Eval/Treat Not Completed: Other (comment). Pt ambulating on his own in the room multiple times per day, has no mobility concerns, knows his back precautions and how to donn his brace.  Is ok with PT signing off.  No more acute therapy needs at this time. He is also no longer on oxygen.   Thanks,  Corinna Capra, PT, DPT  Acute Rehabilitation Ortho Tech Supervisor [phone] pager #[phone] office      Michael Hill 09/13/2021, 4:20 PM

## 2021-09-13 NOTE — Plan of Care (Signed)

## 2021-09-14 ENCOUNTER — Encounter (HOSPITAL_COMMUNITY): Payer: Self-pay | Admitting: Internal Medicine

## 2021-09-14 ENCOUNTER — Encounter (HOSPITAL_COMMUNITY)
Admission: EM | Disposition: A | Discharge status: Home Health | Payer: Self-pay | Source: Home / Self Care | Attending: Internal Medicine

## 2021-09-14 ENCOUNTER — Inpatient Hospital Stay (HOSPITAL_COMMUNITY): Payer: Medicare PPO

## 2021-09-14 ENCOUNTER — Inpatient Hospital Stay (HOSPITAL_COMMUNITY): Payer: Medicare PPO | Admitting: Certified Registered"

## 2021-09-14 DIAGNOSIS — A419 Sepsis, unspecified organism: Secondary | ICD-10-CM | POA: Diagnosis not present

## 2021-09-14 DIAGNOSIS — R7881 Bacteremia: Secondary | ICD-10-CM

## 2021-09-14 DIAGNOSIS — J9601 Acute respiratory failure with hypoxia: Secondary | ICD-10-CM | POA: Diagnosis not present

## 2021-09-14 DIAGNOSIS — M4620 Osteomyelitis of vertebra, site unspecified: Secondary | ICD-10-CM | POA: Diagnosis not present

## 2021-09-14 DIAGNOSIS — R6520 Severe sepsis without septic shock: Secondary | ICD-10-CM | POA: Diagnosis not present

## 2021-09-14 DIAGNOSIS — A4101 Sepsis due to Methicillin susceptible Staphylococcus aureus: Secondary | ICD-10-CM | POA: Diagnosis not present

## 2021-09-14 HISTORY — PX: TEE WITHOUT CARDIOVERSION: SHX5443

## 2021-09-14 LAB — AEROBIC/ANAEROBIC CULTURE W GRAM STAIN (SURGICAL/DEEP WOUND)

## 2021-09-14 SURGERY — ECHOCARDIOGRAM, TRANSESOPHAGEAL
Anesthesia: Monitor Anesthesia Care

## 2021-09-14 MED ORDER — HEPARIN SOD (PORK) LOCK FLUSH 100 UNIT/ML IV SOLN
250.0000 [IU] | INTRAVENOUS | Status: AC | PRN
  Administered 2021-09-14: 250 [IU]
  Filled 2021-09-14: qty 2.5

## 2021-09-14 MED ORDER — METOPROLOL SUCCINATE ER 50 MG PO TB24: 50.0000 mg | ORAL_TABLET | Freq: Every day | ORAL | 0 refills | Status: DC

## 2021-09-14 MED ORDER — PANTOPRAZOLE SODIUM 40 MG PO TBEC: 40.0000 mg | DELAYED_RELEASE_TABLET | Freq: Every day | ORAL | 0 refills | Status: DC

## 2021-09-14 MED ORDER — SODIUM CHLORIDE 0.9 % IV SOLN: INTRAVENOUS | Status: DC | PRN

## 2021-09-14 MED ORDER — ASCORBIC ACID 500 MG PO TABS: 500.0000 mg | ORAL_TABLET | Freq: Every day | ORAL | 0 refills | Status: DC

## 2021-09-14 MED ORDER — FAMOTIDINE 20 MG PO TABS: 20.0000 mg | ORAL_TABLET | Freq: Every day | ORAL | 0 refills | Status: DC

## 2021-09-14 MED ORDER — ZINC SULFATE 220 (50 ZN) MG PO CAPS: 220.0000 mg | ORAL_CAPSULE | Freq: Every day | ORAL | 0 refills | Status: DC

## 2021-09-14 MED ORDER — PROPOFOL 10 MG/ML IV BOLUS
INTRAVENOUS | Status: DC | PRN
  Administered 2021-09-14: 30 mg via INTRAVENOUS
  Administered 2021-09-14 (×2): 20 mg via INTRAVENOUS

## 2021-09-14 MED ORDER — PROPOFOL 500 MG/50ML IV EMUL
INTRAVENOUS | Status: DC | PRN
  Administered 2021-09-14: 125 ug/kg/min via INTRAVENOUS

## 2021-09-14 MED ORDER — DOCUSATE SODIUM 100 MG PO CAPS: 100.0000 mg | ORAL_CAPSULE | Freq: Two times a day (BID) | ORAL | 0 refills | Status: DC

## 2021-09-14 MED ORDER — DEXAMETHASONE 6 MG PO TABS: 6.0000 mg | ORAL_TABLET | Freq: Every day | ORAL | 0 refills | Status: AC

## 2021-09-14 NOTE — Progress Notes (Signed)
  Echocardiogram Echocardiogram Transesophageal has been performed.  Michael Hill F 09/14/2021, 2:41 PM

## 2021-09-14 NOTE — Progress Notes (Signed)
    Transesophageal Echocardiogram Note  Michael Hill [id number] 1951/10/27  Procedure: Transesophageal Echocardiogram Indications: Bacteremia  Procedure Details Consent: Obtained Time Out: Verified patient identification, verified procedure, site/side was marked, verified correct patient position, special equipment/implants available, Radiology Safety Procedures followed,  medications/allergies/relevent history reviewed, required imaging and test results available.  Performed  Medications:  Pt sedated by anesthesia with diprovan 272 mg IV total.  Normal LV function; no vegetations   Complications: No apparent complications Patient did tolerate procedure well.  [name], MD

## 2021-09-14 NOTE — Progress Notes (Signed)
ID Brief Note  TEE with no vegetations No change in prior plan ID will sign off Please call us with questions  Odette Fraction, MD Infectious Disease Physician [facility] [address] [place], [place] [address] Phone: [phone]  Fax: [phone]

## 2021-09-14 NOTE — Anesthesia Procedure Notes (Addendum)
Procedure Name: MAC Date/Time: 09/14/2021 1:32 PM Performed by: Rande Brunt, CRNA Pre-anesthesia Checklist: Patient identified, Emergency Drugs available, Suction available and Patient being monitored Patient Re-evaluated:Patient Re-evaluated prior to induction Oxygen Delivery Method: Nasal cannula Preoxygenation: Pre-oxygenation with 100% oxygen Induction Type: IV induction Airway Equipment and Method: Bite block Placement Confirmation: positive ETCO2 and CO2 detector Dental Injury: Teeth and Oropharynx as per pre-operative assessment

## 2021-09-14 NOTE — Progress Notes (Signed)
Discharge instructions discussed with patient and wife.  Both verbalize understanding, deny questions.  Discharged home via wheelchair to private vehicle.  All belongings accounted for by patient and wife.  No changes noted in assessment.

## 2021-09-14 NOTE — Plan of Care (Signed)

## 2021-09-14 NOTE — Progress Notes (Signed)
Subjective: Patient reports feeling better overall, having difficulty with timing of pain medications  Objective: Vital signs in last 24 hours: Temp:  [97.5 F (36.4 C)-98.1 F (36.7 C)] 97.5 F (36.4 C) (10/11 0437) Pulse Rate:  [68-89] 82 (10/11 0437) Resp:  [15-19] 19 (10/11 0437) BP: (90-138)/(56-85) 138/71 (10/11 0437) SpO2:  [94 %-100 %] 96 % (10/11 0437)  Intake/Output from previous day: 10/10 0701 - 10/11 0700 In: 760 [P.O.:360; IV Piggyback:400] Out: -  Intake/Output this shift: No intake/output data recorded.  Physical Exam: Up walking in brace.  Denies leg pain.  Still coughing  Lab Results: Recent Labs    09/12/21 0431 09/13/21 0455  WBC 10.2 10.3  HGB 11.6* 11.4*  HCT 34.4* 35.0*  PLT 291 317   BMET Recent Labs    09/12/21 0431 09/13/21 0455  NA 134* 135  K 3.8 3.9  CL 98 99  CO2 28 26  GLUCOSE 110* 118*  BUN 25* 24*  CREATININE 0.84 0.78  CALCIUM 8.4* 8.4*    Studies/Results: [place] EKG SITE RITE  Result Date: 09/12/2021 If Site Rite image not attached, placement could not be confirmed due to current cardiac rhythm.   Assessment/Plan: Patient is improving.  Awaiting TEE.  Home soon on IV ABX.  Patient wants to go back on his baseline pain medication regimen, which has been working well for him for the last seven years.      LOS: 6 days    Dorian Heckle, MD 09/14/2021, 8:15 AM

## 2021-09-14 NOTE — Consult Note (Signed)
[facility] CM Inpatient Consult   09/14/2021  Michael Hill Feb 11, 1951 [id number]  [facility] [[facility]]  Accountable Care Organization [ACO] Patient: [employer]   Primary Care Provider:  Tally Joe, MD [facility] is listed as PCP  Patient screened for less than 30 days readmission hospitalization with noted high risk score for unplanned readmission risk and to assess for potential [facility]  [[facility]] Care Management service needs for post hospital transition.  Review of patient's medical record reveals patient is for home with IV therapy/Home Health per inpatient [facility] RNCM progress notes.  Plan:  No current [facility] Care Management needs assessed at this time.    For questions contact:   Charlesetta Shanks, RN BSN CCM [facility]  [phone] business mobile phone Toll free office [phone]  Fax number: [phone] [email or web address]

## 2021-09-14 NOTE — TOC Transition Note (Signed)
Transition of Care (TOC) - CM/SW Discharge Note Donn Pierini RN,BSN Transitions of Care Unit 4NP (Non Trauma)- RN Case Manager See Treatment Team for direct Phone #    Patient Details  Name: Michael Hill MRN: [id number] Date of Birth: 1951-01-20  Transition of Care [facility]) CM/SW Contact:  Darrold Span, RN Phone Number: 09/14/2021, 4:02 PM   Clinical Narrative:    Pt stable for transition home today, arrangements have been made for home IV abx needs with [facility] and OP[facility] home infusion orders in place. Referrals have been made and accepted by [facility] as per previous CM note.  Spoke with Pam at [facility] and bedside teaching has been completed with pt and daughter at the bedside for home infusion needs, home IV abx needs are in place. Frances Furbish will reach out to pt to schedule home visit post discharge.   Family to transport home.    Final next level of care: Home w Home Health Services Barriers to Discharge: Barriers Resolved   Patient Goals and CMS Choice Patient states their goals for this hospitalization and ongoing recovery are:: return home CMS Medicare.gov Compare Post Acute Care list provided to:: Patient Choice offered to / list presented to : Patient  Discharge Placement                 Home w/ [facility]      Discharge Plan and Services   Discharge Planning Services: CM Consult Post Acute Care Choice: Home Health          DME Arranged: N/A DME Agency: NA       HH Arranged: IV Antibiotics, RN HH Agency: [facility] Date HH Agency Contacted: 09/13/21 Time HH Agency Contacted: 1535 Representative spoke with at [facility] Agency: Kandee Keen  Social Determinants of Health (SDOH) Interventions     Readmission Risk Interventions Readmission Risk Prevention Plan 09/14/2021 09/14/2021 09/13/2021  Transportation Screening - - -  PCP or Specialist Appt within 3-5 Days (No Data) Complete -  HRI or Home Care Consult - - -  Social Work Consult for Recovery Care  Planning/Counseling - - Complete  Palliative Care Screening - - Not Applicable  Medication Review Oceanographer) - - Complete  Some recent data might be hidden

## 2021-09-14 NOTE — Transfer of Care (Signed)
Immediate Anesthesia Transfer of Care Note  Patient: Michael Hill  Procedure(s) Performed: TRANSESOPHAGEAL ECHOCARDIOGRAM (TEE)  Patient Location: Endoscopy Unit  Anesthesia Type:MAC  Level of Consciousness: drowsy, patient cooperative and responds to stimulation  Airway & Oxygen Therapy: Patient Spontanous Breathing and Patient connected to nasal cannula oxygen  Post-op Assessment: Report given to RN, Post -op Vital signs reviewed and stable and Patient moving all extremities X 4  Post vital signs: Reviewed and stable  Last Vitals:  Vitals Value Taken Time  BP 99/75   Temp    Pulse 85   Resp 14   SpO2 98     Last Pain:  Vitals:   09/14/21 1146  TempSrc: Oral  PainSc: 5       Patients Stated Pain Goal: 0 (62/82/41 7530)  Complications: No notable events documented.

## 2021-09-14 NOTE — Plan of Care (Signed)
  Problem: Activity: Goal: Risk for activity intolerance will decrease Outcome: Progressing   Problem: Pain Managment: Goal: General experience of comfort will improve Outcome: Progressing   Problem: Safety: Goal: Ability to remain free from injury will improve Outcome: Progressing   

## 2021-09-14 NOTE — Interval H&P Note (Signed)
History and Physical Interval Note:  09/14/2021 1:19 PM  Michael Hill  has presented today for surgery, with the diagnosis of BACTEREMIA.  The various methods of treatment have been discussed with the patient and family. After consideration of risks, benefits and other options for treatment, the patient has consented to  Procedure(s): TRANSESOPHAGEAL ECHOCARDIOGRAM (TEE) (N/A) as a surgical intervention.  The patient's history has been reviewed, patient examined, no change in status, stable for surgery.  I have reviewed the patient's chart and labs.  Questions were answered to the patient's satisfaction.     [name]

## 2021-09-15 LAB — CULTURE, BLOOD (ROUTINE X 2)
Culture: NO GROWTH
Culture: NO GROWTH
Special Requests: ADEQUATE
Special Requests: ADEQUATE

## 2021-09-15 NOTE — Discharge Summary (Signed)
grossly normal.  LEFT VENTRICLE PLAX 2D LVIDd:         5.30 cm LVIDs:         3.30 cm LV PW:         1.30 cm LV IVS:        1.10 cm  IVC IVC diam: 1.30 cm LEFT ATRIUM             Index LA diam:        4.00 cm 1.92 cm/m LA Vol (A2C):   61.3 ml 29.39 ml/m LA Vol (A4C):   84.3 ml 40.41 ml/m LA Biplane Vol: 73.6 ml 35.28 ml/m   AORTA Ao Root diam: 3.90 cm TRICUSPID VALVE TR Peak grad:   25.6 mmHg TR Vmax:        253.00 cm/s Mertie Moores MD Electronically signed by Mertie Moores MD Signature Date/Time: 09/09/2021/2:25:35 PM    Final    ECHO TEE  Result Date: 09/14/2021    TRANSESOPHOGEAL ECHO REPORT   Patient Name:   Michael Hill Date of Exam: 09/14/2021 Medical Rec #:  [id number]       Height:       67.0 in Accession #:    [id number]      Weight:       215.2 lb Date of Birth:  04-30-1951      BSA:          2.086 m Patient Age:    70 years        BP:           125/80 mmHg Patient Gender: M               HR:           80 bpm. Exam Location:  Inpatient Procedure: Transesophageal Echo Indications:     Bacteremia  History:         Patient has prior history of Echocardiogram examinations, most                  recent 09/09/2021.  Sonographer:     Merrie Roof RDCS Referring Phys:  [place] Diagnosing Phys: Kirk Ruths MD PROCEDURE: The transesophogeal probe was passed without difficulty through the esophogus of the patient. Sedation performed by different physician. The patient developed no complications during the procedure. IMPRESSIONS  1. No vegetations.  2. Left ventricular ejection fraction, by estimation, is 55 to 60%. The left ventricle has normal function.  3. Right ventricular systolic function is normal. The right ventricular size is normal.  4. No left atrial/left atrial appendage thrombus was detected.  5. The mitral valve is normal in structure. Trivial mitral valve regurgitation.  6. The aortic valve is tricuspid. Aortic valve regurgitation is not visualized.  7. Aortic dilatation noted. 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 FINDINGS  Left Ventricle: Left ventricular ejection fraction, by estimation, is 55 to 60%. The left ventricle has normal function. The left ventricular internal cavity size was normal in size. Right Ventricle: The right ventricular size is normal. Right ventricular systolic function is normal. Left Atrium: Left atrial size was normal in size. No left atrial/left atrial appendage thrombus was detected. Right Atrium: Right atrial size was normal in size. Pericardium: There is no evidence of pericardial effusion. Mitral Valve: The mitral valve is normal in structure. Trivial mitral valve regurgitation. Tricuspid Valve: The tricuspid valve is normal in structure. Tricuspid valve regurgitation is trivial. Aortic Valve: The aortic valve  grossly normal.  LEFT VENTRICLE PLAX 2D LVIDd:         5.30 cm LVIDs:         3.30 cm LV PW:         1.30 cm LV IVS:        1.10 cm  IVC IVC diam: 1.30 cm LEFT ATRIUM             Index LA diam:        4.00 cm 1.92 cm/m LA Vol (A2C):   61.3 ml 29.39 ml/m LA Vol (A4C):   84.3 ml 40.41 ml/m LA Biplane Vol: 73.6 ml 35.28 ml/m   AORTA Ao Root diam: 3.90 cm TRICUSPID VALVE TR Peak grad:   25.6 mmHg TR Vmax:        253.00 cm/s Mertie Moores MD Electronically signed by Mertie Moores MD Signature Date/Time: 09/09/2021/2:25:35 PM    Final    ECHO TEE  Result Date: 09/14/2021    TRANSESOPHOGEAL ECHO REPORT   Patient Name:   Michael Hill Date of Exam: 09/14/2021 Medical Rec #:  [id number]       Height:       67.0 in Accession #:    [id number]      Weight:       215.2 lb Date of Birth:  04-30-1951      BSA:          2.086 m Patient Age:    70 years        BP:           125/80 mmHg Patient Gender: M               HR:           80 bpm. Exam Location:  Inpatient Procedure: Transesophageal Echo Indications:     Bacteremia  History:         Patient has prior history of Echocardiogram examinations, most                  recent 09/09/2021.  Sonographer:     Merrie Roof RDCS Referring Phys:  [place] Diagnosing Phys: Kirk Ruths MD PROCEDURE: The transesophogeal probe was passed without difficulty through the esophogus of the patient. Sedation performed by different physician. The patient developed no complications during the procedure. IMPRESSIONS  1. No vegetations.  2. Left ventricular ejection fraction, by estimation, is 55 to 60%. The left ventricle has normal function.  3. Right ventricular systolic function is normal. The right ventricular size is normal.  4. No left atrial/left atrial appendage thrombus was detected.  5. The mitral valve is normal in structure. Trivial mitral valve regurgitation.  6. The aortic valve is tricuspid. Aortic valve regurgitation is not visualized.  7. Aortic dilatation noted. 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 FINDINGS  Left Ventricle: Left ventricular ejection fraction, by estimation, is 55 to 60%. The left ventricle has normal function. The left ventricular internal cavity size was normal in size. Right Ventricle: The right ventricular size is normal. Right ventricular systolic function is normal. Left Atrium: Left atrial size was normal in size. No left atrial/left atrial appendage thrombus was detected. Right Atrium: Right atrial size was normal in size. Pericardium: There is no evidence of pericardial effusion. Mitral Valve: The mitral valve is normal in structure. Trivial mitral valve regurgitation. Tricuspid Valve: The tricuspid valve is normal in structure. Tricuspid valve regurgitation is trivial. Aortic Valve: The aortic valve  identified within the right upper lobe. No focal airspace consolidation. No signs of pleural effusion. Visualized osseous structures appear intact. IMPRESSION: Asymmetric increase interstitial markings within the right upper lobe may reflect interstitial edema or atypical infection. Electronically Signed   By: Kerby Moors M.D.   On: 09/08/2021 11:38   ECHOCARDIOGRAM COMPLETE  Result Date: 09/09/2021    ECHOCARDIOGRAM REPORT   Patient Name:   Michael Hill Date of Exam: 09/09/2021 Medical Rec #:  [id number]        Height:       67.0 in Accession #:    [id number]      Weight:       215.2 lb Date of Birth:  06-24-1951      BSA:          2.086 m Patient Age:    52 years        BP:           120/69 mmHg Patient Gender: M               HR:           101 bpm. 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                 Arrythmias:Tachycardia. Sepsis. Deveoped infection post back                 surgery. Covid 19 positive. Edema.  Sonographer:    Merrie Roof RDCS Referring Phys: [id number] [place]  1. Left ventricular ejection fraction, by estimation, is 55 to 60%. The left ventricle has normal function. The left ventricle has no regional wall motion abnormalities. Left ventricular diastolic function could not be evaluated.  2. Right ventricular systolic function was not well visualized. The right ventricular size is not well visualized. There is normal pulmonary artery systolic pressure.  3. Left atrial size was mildly dilated.  4. The mitral valve is normal in structure. Trivial mitral valve regurgitation.  5. The aortic valve is normal in structure. Aortic valve regurgitation is not visualized. FINDINGS  Left Ventricle: Left ventricular ejection fraction, by estimation, is 55 to 60%. The left ventricle has normal function. The left ventricle has no regional wall motion abnormalities. The left ventricular internal cavity size was normal in size. There is  no left ventricular hypertrophy. Left ventricular diastolic function could not be evaluated. Right Ventricle: The right ventricular size is not well visualized. Right vetricular wall thickness was not well visualized. Right ventricular systolic function was not well visualized. There is normal pulmonary artery systolic pressure. The tricuspid regurgitant velocity is 2.53 m/s, and with an assumed right atrial  pressure of 3 mmHg, the estimated right ventricular systolic pressure is 27.5 mmHg. Left Atrium: Left atrial size was mildly dilated. Right Atrium: Right atrial size was not well visualized. Pericardium: There is no evidence of pericardial effusion. Mitral Valve: The mitral valve is normal in structure. Trivial mitral valve regurgitation. Tricuspid Valve: The tricuspid valve is normal in structure. Tricuspid valve regurgitation is not demonstrated. Aortic Valve: The aortic valve is normal in structure. Aortic valve regurgitation is not visualized. Pulmonic Valve: The pulmonic valve was normal in structure. Pulmonic valve regurgitation is trivial. Aorta: The aortic root is normal in size and structure. IAS/Shunts: The atrial septum is  grossly normal.  LEFT VENTRICLE PLAX 2D LVIDd:         5.30 cm LVIDs:         3.30 cm LV PW:         1.30 cm LV IVS:        1.10 cm  IVC IVC diam: 1.30 cm LEFT ATRIUM             Index LA diam:        4.00 cm 1.92 cm/m LA Vol (A2C):   61.3 ml 29.39 ml/m LA Vol (A4C):   84.3 ml 40.41 ml/m LA Biplane Vol: 73.6 ml 35.28 ml/m   AORTA Ao Root diam: 3.90 cm TRICUSPID VALVE TR Peak grad:   25.6 mmHg TR Vmax:        253.00 cm/s Mertie Moores MD Electronically signed by Mertie Moores MD Signature Date/Time: 09/09/2021/2:25:35 PM    Final    ECHO TEE  Result Date: 09/14/2021    TRANSESOPHOGEAL ECHO REPORT   Patient Name:   Michael Hill Date of Exam: 09/14/2021 Medical Rec #:  [id number]       Height:       67.0 in Accession #:    [id number]      Weight:       215.2 lb Date of Birth:  04-30-1951      BSA:          2.086 m Patient Age:    70 years        BP:           125/80 mmHg Patient Gender: M               HR:           80 bpm. Exam Location:  Inpatient Procedure: Transesophageal Echo Indications:     Bacteremia  History:         Patient has prior history of Echocardiogram examinations, most                  recent 09/09/2021.  Sonographer:     Merrie Roof RDCS Referring Phys:  [place] Diagnosing Phys: Kirk Ruths MD PROCEDURE: The transesophogeal probe was passed without difficulty through the esophogus of the patient. Sedation performed by different physician. The patient developed no complications during the procedure. IMPRESSIONS  1. No vegetations.  2. Left ventricular ejection fraction, by estimation, is 55 to 60%. The left ventricle has normal function.  3. Right ventricular systolic function is normal. The right ventricular size is normal.  4. No left atrial/left atrial appendage thrombus was detected.  5. The mitral valve is normal in structure. Trivial mitral valve regurgitation.  6. The aortic valve is tricuspid. Aortic valve regurgitation is not visualized.  7. Aortic dilatation noted. 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 FINDINGS  Left Ventricle: Left ventricular ejection fraction, by estimation, is 55 to 60%. The left ventricle has normal function. The left ventricular internal cavity size was normal in size. Right Ventricle: The right ventricular size is normal. Right ventricular systolic function is normal. Left Atrium: Left atrial size was normal in size. No left atrial/left atrial appendage thrombus was detected. Right Atrium: Right atrial size was normal in size. Pericardium: There is no evidence of pericardial effusion. Mitral Valve: The mitral valve is normal in structure. Trivial mitral valve regurgitation. Tricuspid Valve: The tricuspid valve is normal in structure. Tricuspid valve regurgitation is trivial. Aortic Valve: The aortic valve  identified within the right upper lobe. No focal airspace consolidation. No signs of pleural effusion. Visualized osseous structures appear intact. IMPRESSION: Asymmetric increase interstitial markings within the right upper lobe may reflect interstitial edema or atypical infection. Electronically Signed   By: Kerby Moors M.D.   On: 09/08/2021 11:38   ECHOCARDIOGRAM COMPLETE  Result Date: 09/09/2021    ECHOCARDIOGRAM REPORT   Patient Name:   Michael Hill Date of Exam: 09/09/2021 Medical Rec #:  [id number]        Height:       67.0 in Accession #:    [id number]      Weight:       215.2 lb Date of Birth:  06-24-1951      BSA:          2.086 m Patient Age:    52 years        BP:           120/69 mmHg Patient Gender: M               HR:           101 bpm. 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                 Arrythmias:Tachycardia. Sepsis. Deveoped infection post back                 surgery. Covid 19 positive. Edema.  Sonographer:    Merrie Roof RDCS Referring Phys: [id number] [place]  1. Left ventricular ejection fraction, by estimation, is 55 to 60%. The left ventricle has normal function. The left ventricle has no regional wall motion abnormalities. Left ventricular diastolic function could not be evaluated.  2. Right ventricular systolic function was not well visualized. The right ventricular size is not well visualized. There is normal pulmonary artery systolic pressure.  3. Left atrial size was mildly dilated.  4. The mitral valve is normal in structure. Trivial mitral valve regurgitation.  5. The aortic valve is normal in structure. Aortic valve regurgitation is not visualized. FINDINGS  Left Ventricle: Left ventricular ejection fraction, by estimation, is 55 to 60%. The left ventricle has normal function. The left ventricle has no regional wall motion abnormalities. The left ventricular internal cavity size was normal in size. There is  no left ventricular hypertrophy. Left ventricular diastolic function could not be evaluated. Right Ventricle: The right ventricular size is not well visualized. Right vetricular wall thickness was not well visualized. Right ventricular systolic function was not well visualized. There is normal pulmonary artery systolic pressure. The tricuspid regurgitant velocity is 2.53 m/s, and with an assumed right atrial  pressure of 3 mmHg, the estimated right ventricular systolic pressure is 27.5 mmHg. Left Atrium: Left atrial size was mildly dilated. Right Atrium: Right atrial size was not well visualized. Pericardium: There is no evidence of pericardial effusion. Mitral Valve: The mitral valve is normal in structure. Trivial mitral valve regurgitation. Tricuspid Valve: The tricuspid valve is normal in structure. Tricuspid valve regurgitation is not demonstrated. Aortic Valve: The aortic valve is normal in structure. Aortic valve regurgitation is not visualized. Pulmonic Valve: The pulmonic valve was normal in structure. Pulmonic valve regurgitation is trivial. Aorta: The aortic root is normal in size and structure. IAS/Shunts: The atrial septum is  grossly normal.  LEFT VENTRICLE PLAX 2D LVIDd:         5.30 cm LVIDs:         3.30 cm LV PW:         1.30 cm LV IVS:        1.10 cm  IVC IVC diam: 1.30 cm LEFT ATRIUM             Index LA diam:        4.00 cm 1.92 cm/m LA Vol (A2C):   61.3 ml 29.39 ml/m LA Vol (A4C):   84.3 ml 40.41 ml/m LA Biplane Vol: 73.6 ml 35.28 ml/m   AORTA Ao Root diam: 3.90 cm TRICUSPID VALVE TR Peak grad:   25.6 mmHg TR Vmax:        253.00 cm/s Mertie Moores MD Electronically signed by Mertie Moores MD Signature Date/Time: 09/09/2021/2:25:35 PM    Final    ECHO TEE  Result Date: 09/14/2021    TRANSESOPHOGEAL ECHO REPORT   Patient Name:   Michael Hill Date of Exam: 09/14/2021 Medical Rec #:  [id number]       Height:       67.0 in Accession #:    [id number]      Weight:       215.2 lb Date of Birth:  04-30-1951      BSA:          2.086 m Patient Age:    70 years        BP:           125/80 mmHg Patient Gender: M               HR:           80 bpm. Exam Location:  Inpatient Procedure: Transesophageal Echo Indications:     Bacteremia  History:         Patient has prior history of Echocardiogram examinations, most                  recent 09/09/2021.  Sonographer:     Merrie Roof RDCS Referring Phys:  [place] Diagnosing Phys: Kirk Ruths MD PROCEDURE: The transesophogeal probe was passed without difficulty through the esophogus of the patient. Sedation performed by different physician. The patient developed no complications during the procedure. IMPRESSIONS  1. No vegetations.  2. Left ventricular ejection fraction, by estimation, is 55 to 60%. The left ventricle has normal function.  3. Right ventricular systolic function is normal. The right ventricular size is normal.  4. No left atrial/left atrial appendage thrombus was detected.  5. The mitral valve is normal in structure. Trivial mitral valve regurgitation.  6. The aortic valve is tricuspid. Aortic valve regurgitation is not visualized.  7. Aortic dilatation noted. 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 FINDINGS  Left Ventricle: Left ventricular ejection fraction, by estimation, is 55 to 60%. The left ventricle has normal function. The left ventricular internal cavity size was normal in size. Right Ventricle: The right ventricular size is normal. Right ventricular systolic function is normal. Left Atrium: Left atrial size was normal in size. No left atrial/left atrial appendage thrombus was detected. Right Atrium: Right atrial size was normal in size. Pericardium: There is no evidence of pericardial effusion. Mitral Valve: The mitral valve is normal in structure. Trivial mitral valve regurgitation. Tricuspid Valve: The tricuspid valve is normal in structure. Tricuspid valve regurgitation is trivial. Aortic Valve: The aortic valve  identified within the right upper lobe. No focal airspace consolidation. No signs of pleural effusion. Visualized osseous structures appear intact. IMPRESSION: Asymmetric increase interstitial markings within the right upper lobe may reflect interstitial edema or atypical infection. Electronically Signed   By: Kerby Moors M.D.   On: 09/08/2021 11:38   ECHOCARDIOGRAM COMPLETE  Result Date: 09/09/2021    ECHOCARDIOGRAM REPORT   Patient Name:   Michael Hill Date of Exam: 09/09/2021 Medical Rec #:  [id number]        Height:       67.0 in Accession #:    [id number]      Weight:       215.2 lb Date of Birth:  06-24-1951      BSA:          2.086 m Patient Age:    52 years        BP:           120/69 mmHg Patient Gender: M               HR:           101 bpm. 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                 Arrythmias:Tachycardia. Sepsis. Deveoped infection post back                 surgery. Covid 19 positive. Edema.  Sonographer:    Merrie Roof RDCS Referring Phys: [id number] [place]  1. Left ventricular ejection fraction, by estimation, is 55 to 60%. The left ventricle has normal function. The left ventricle has no regional wall motion abnormalities. Left ventricular diastolic function could not be evaluated.  2. Right ventricular systolic function was not well visualized. The right ventricular size is not well visualized. There is normal pulmonary artery systolic pressure.  3. Left atrial size was mildly dilated.  4. The mitral valve is normal in structure. Trivial mitral valve regurgitation.  5. The aortic valve is normal in structure. Aortic valve regurgitation is not visualized. FINDINGS  Left Ventricle: Left ventricular ejection fraction, by estimation, is 55 to 60%. The left ventricle has normal function. The left ventricle has no regional wall motion abnormalities. The left ventricular internal cavity size was normal in size. There is  no left ventricular hypertrophy. Left ventricular diastolic function could not be evaluated. Right Ventricle: The right ventricular size is not well visualized. Right vetricular wall thickness was not well visualized. Right ventricular systolic function was not well visualized. There is normal pulmonary artery systolic pressure. The tricuspid regurgitant velocity is 2.53 m/s, and with an assumed right atrial  pressure of 3 mmHg, the estimated right ventricular systolic pressure is 27.5 mmHg. Left Atrium: Left atrial size was mildly dilated. Right Atrium: Right atrial size was not well visualized. Pericardium: There is no evidence of pericardial effusion. Mitral Valve: The mitral valve is normal in structure. Trivial mitral valve regurgitation. Tricuspid Valve: The tricuspid valve is normal in structure. Tricuspid valve regurgitation is not demonstrated. Aortic Valve: The aortic valve is normal in structure. Aortic valve regurgitation is not visualized. Pulmonic Valve: The pulmonic valve was normal in structure. Pulmonic valve regurgitation is trivial. Aorta: The aortic root is normal in size and structure. IAS/Shunts: The atrial septum is  grossly normal.  LEFT VENTRICLE PLAX 2D LVIDd:         5.30 cm LVIDs:         3.30 cm LV PW:         1.30 cm LV IVS:        1.10 cm  IVC IVC diam: 1.30 cm LEFT ATRIUM             Index LA diam:        4.00 cm 1.92 cm/m LA Vol (A2C):   61.3 ml 29.39 ml/m LA Vol (A4C):   84.3 ml 40.41 ml/m LA Biplane Vol: 73.6 ml 35.28 ml/m   AORTA Ao Root diam: 3.90 cm TRICUSPID VALVE TR Peak grad:   25.6 mmHg TR Vmax:        253.00 cm/s Mertie Moores MD Electronically signed by Mertie Moores MD Signature Date/Time: 09/09/2021/2:25:35 PM    Final    ECHO TEE  Result Date: 09/14/2021    TRANSESOPHOGEAL ECHO REPORT   Patient Name:   Michael Hill Date of Exam: 09/14/2021 Medical Rec #:  [id number]       Height:       67.0 in Accession #:    [id number]      Weight:       215.2 lb Date of Birth:  04-30-1951      BSA:          2.086 m Patient Age:    70 years        BP:           125/80 mmHg Patient Gender: M               HR:           80 bpm. Exam Location:  Inpatient Procedure: Transesophageal Echo Indications:     Bacteremia  History:         Patient has prior history of Echocardiogram examinations, most                  recent 09/09/2021.  Sonographer:     Merrie Roof RDCS Referring Phys:  [place] Diagnosing Phys: Kirk Ruths MD PROCEDURE: The transesophogeal probe was passed without difficulty through the esophogus of the patient. Sedation performed by different physician. The patient developed no complications during the procedure. IMPRESSIONS  1. No vegetations.  2. Left ventricular ejection fraction, by estimation, is 55 to 60%. The left ventricle has normal function.  3. Right ventricular systolic function is normal. The right ventricular size is normal.  4. No left atrial/left atrial appendage thrombus was detected.  5. The mitral valve is normal in structure. Trivial mitral valve regurgitation.  6. The aortic valve is tricuspid. Aortic valve regurgitation is not visualized.  7. Aortic dilatation noted. 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 FINDINGS  Left Ventricle: Left ventricular ejection fraction, by estimation, is 55 to 60%. The left ventricle has normal function. The left ventricular internal cavity size was normal in size. Right Ventricle: The right ventricular size is normal. Right ventricular systolic function is normal. Left Atrium: Left atrial size was normal in size. No left atrial/left atrial appendage thrombus was detected. Right Atrium: Right atrial size was normal in size. Pericardium: There is no evidence of pericardial effusion. Mitral Valve: The mitral valve is normal in structure. Trivial mitral valve regurgitation. Tricuspid Valve: The tricuspid valve is normal in structure. Tricuspid valve regurgitation is trivial. Aortic Valve: The aortic valve  identified within the right upper lobe. No focal airspace consolidation. No signs of pleural effusion. Visualized osseous structures appear intact. IMPRESSION: Asymmetric increase interstitial markings within the right upper lobe may reflect interstitial edema or atypical infection. Electronically Signed   By: Kerby Moors M.D.   On: 09/08/2021 11:38   ECHOCARDIOGRAM COMPLETE  Result Date: 09/09/2021    ECHOCARDIOGRAM REPORT   Patient Name:   Michael Hill Date of Exam: 09/09/2021 Medical Rec #:  [id number]        Height:       67.0 in Accession #:    [id number]      Weight:       215.2 lb Date of Birth:  06-24-1951      BSA:          2.086 m Patient Age:    52 years        BP:           120/69 mmHg Patient Gender: M               HR:           101 bpm. 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                 Arrythmias:Tachycardia. Sepsis. Deveoped infection post back                 surgery. Covid 19 positive. Edema.  Sonographer:    Merrie Roof RDCS Referring Phys: [id number] [place]  1. Left ventricular ejection fraction, by estimation, is 55 to 60%. The left ventricle has normal function. The left ventricle has no regional wall motion abnormalities. Left ventricular diastolic function could not be evaluated.  2. Right ventricular systolic function was not well visualized. The right ventricular size is not well visualized. There is normal pulmonary artery systolic pressure.  3. Left atrial size was mildly dilated.  4. The mitral valve is normal in structure. Trivial mitral valve regurgitation.  5. The aortic valve is normal in structure. Aortic valve regurgitation is not visualized. FINDINGS  Left Ventricle: Left ventricular ejection fraction, by estimation, is 55 to 60%. The left ventricle has normal function. The left ventricle has no regional wall motion abnormalities. The left ventricular internal cavity size was normal in size. There is  no left ventricular hypertrophy. Left ventricular diastolic function could not be evaluated. Right Ventricle: The right ventricular size is not well visualized. Right vetricular wall thickness was not well visualized. Right ventricular systolic function was not well visualized. There is normal pulmonary artery systolic pressure. The tricuspid regurgitant velocity is 2.53 m/s, and with an assumed right atrial  pressure of 3 mmHg, the estimated right ventricular systolic pressure is 27.5 mmHg. Left Atrium: Left atrial size was mildly dilated. Right Atrium: Right atrial size was not well visualized. Pericardium: There is no evidence of pericardial effusion. Mitral Valve: The mitral valve is normal in structure. Trivial mitral valve regurgitation. Tricuspid Valve: The tricuspid valve is normal in structure. Tricuspid valve regurgitation is not demonstrated. Aortic Valve: The aortic valve is normal in structure. Aortic valve regurgitation is not visualized. Pulmonic Valve: The pulmonic valve was normal in structure. Pulmonic valve regurgitation is trivial. Aorta: The aortic root is normal in size and structure. IAS/Shunts: The atrial septum is  grossly normal.  LEFT VENTRICLE PLAX 2D LVIDd:         5.30 cm LVIDs:         3.30 cm LV PW:         1.30 cm LV IVS:        1.10 cm  IVC IVC diam: 1.30 cm LEFT ATRIUM             Index LA diam:        4.00 cm 1.92 cm/m LA Vol (A2C):   61.3 ml 29.39 ml/m LA Vol (A4C):   84.3 ml 40.41 ml/m LA Biplane Vol: 73.6 ml 35.28 ml/m   AORTA Ao Root diam: 3.90 cm TRICUSPID VALVE TR Peak grad:   25.6 mmHg TR Vmax:        253.00 cm/s Mertie Moores MD Electronically signed by Mertie Moores MD Signature Date/Time: 09/09/2021/2:25:35 PM    Final    ECHO TEE  Result Date: 09/14/2021    TRANSESOPHOGEAL ECHO REPORT   Patient Name:   Michael Hill Date of Exam: 09/14/2021 Medical Rec #:  [id number]       Height:       67.0 in Accession #:    [id number]      Weight:       215.2 lb Date of Birth:  04-30-1951      BSA:          2.086 m Patient Age:    70 years        BP:           125/80 mmHg Patient Gender: M               HR:           80 bpm. Exam Location:  Inpatient Procedure: Transesophageal Echo Indications:     Bacteremia  History:         Patient has prior history of Echocardiogram examinations, most                  recent 09/09/2021.  Sonographer:     Merrie Roof RDCS Referring Phys:  [place] Diagnosing Phys: Kirk Ruths MD PROCEDURE: The transesophogeal probe was passed without difficulty through the esophogus of the patient. Sedation performed by different physician. The patient developed no complications during the procedure. IMPRESSIONS  1. No vegetations.  2. Left ventricular ejection fraction, by estimation, is 55 to 60%. The left ventricle has normal function.  3. Right ventricular systolic function is normal. The right ventricular size is normal.  4. No left atrial/left atrial appendage thrombus was detected.  5. The mitral valve is normal in structure. Trivial mitral valve regurgitation.  6. The aortic valve is tricuspid. Aortic valve regurgitation is not visualized.  7. Aortic dilatation noted. 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 FINDINGS  Left Ventricle: Left ventricular ejection fraction, by estimation, is 55 to 60%. The left ventricle has normal function. The left ventricular internal cavity size was normal in size. Right Ventricle: The right ventricular size is normal. Right ventricular systolic function is normal. Left Atrium: Left atrial size was normal in size. No left atrial/left atrial appendage thrombus was detected. Right Atrium: Right atrial size was normal in size. Pericardium: There is no evidence of pericardial effusion. Mitral Valve: The mitral valve is normal in structure. Trivial mitral valve regurgitation. Tricuspid Valve: The tricuspid valve is normal in structure. Tricuspid valve regurgitation is trivial. Aortic Valve: The aortic valve  identified within the right upper lobe. No focal airspace consolidation. No signs of pleural effusion. Visualized osseous structures appear intact. IMPRESSION: Asymmetric increase interstitial markings within the right upper lobe may reflect interstitial edema or atypical infection. Electronically Signed   By: Kerby Moors M.D.   On: 09/08/2021 11:38   ECHOCARDIOGRAM COMPLETE  Result Date: 09/09/2021    ECHOCARDIOGRAM REPORT   Patient Name:   Michael Hill Date of Exam: 09/09/2021 Medical Rec #:  [id number]        Height:       67.0 in Accession #:    [id number]      Weight:       215.2 lb Date of Birth:  06-24-1951      BSA:          2.086 m Patient Age:    52 years        BP:           120/69 mmHg Patient Gender: M               HR:           101 bpm. 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                 Arrythmias:Tachycardia. Sepsis. Deveoped infection post back                 surgery. Covid 19 positive. Edema.  Sonographer:    Merrie Roof RDCS Referring Phys: [id number] [place]  1. Left ventricular ejection fraction, by estimation, is 55 to 60%. The left ventricle has normal function. The left ventricle has no regional wall motion abnormalities. Left ventricular diastolic function could not be evaluated.  2. Right ventricular systolic function was not well visualized. The right ventricular size is not well visualized. There is normal pulmonary artery systolic pressure.  3. Left atrial size was mildly dilated.  4. The mitral valve is normal in structure. Trivial mitral valve regurgitation.  5. The aortic valve is normal in structure. Aortic valve regurgitation is not visualized. FINDINGS  Left Ventricle: Left ventricular ejection fraction, by estimation, is 55 to 60%. The left ventricle has normal function. The left ventricle has no regional wall motion abnormalities. The left ventricular internal cavity size was normal in size. There is  no left ventricular hypertrophy. Left ventricular diastolic function could not be evaluated. Right Ventricle: The right ventricular size is not well visualized. Right vetricular wall thickness was not well visualized. Right ventricular systolic function was not well visualized. There is normal pulmonary artery systolic pressure. The tricuspid regurgitant velocity is 2.53 m/s, and with an assumed right atrial  pressure of 3 mmHg, the estimated right ventricular systolic pressure is 27.5 mmHg. Left Atrium: Left atrial size was mildly dilated. Right Atrium: Right atrial size was not well visualized. Pericardium: There is no evidence of pericardial effusion. Mitral Valve: The mitral valve is normal in structure. Trivial mitral valve regurgitation. Tricuspid Valve: The tricuspid valve is normal in structure. Tricuspid valve regurgitation is not demonstrated. Aortic Valve: The aortic valve is normal in structure. Aortic valve regurgitation is not visualized. Pulmonic Valve: The pulmonic valve was normal in structure. Pulmonic valve regurgitation is trivial. Aorta: The aortic root is normal in size and structure. IAS/Shunts: The atrial septum is  grossly normal.  LEFT VENTRICLE PLAX 2D LVIDd:         5.30 cm LVIDs:         3.30 cm LV PW:         1.30 cm LV IVS:        1.10 cm  IVC IVC diam: 1.30 cm LEFT ATRIUM             Index LA diam:        4.00 cm 1.92 cm/m LA Vol (A2C):   61.3 ml 29.39 ml/m LA Vol (A4C):   84.3 ml 40.41 ml/m LA Biplane Vol: 73.6 ml 35.28 ml/m   AORTA Ao Root diam: 3.90 cm TRICUSPID VALVE TR Peak grad:   25.6 mmHg TR Vmax:        253.00 cm/s Mertie Moores MD Electronically signed by Mertie Moores MD Signature Date/Time: 09/09/2021/2:25:35 PM    Final    ECHO TEE  Result Date: 09/14/2021    TRANSESOPHOGEAL ECHO REPORT   Patient Name:   Michael Hill Date of Exam: 09/14/2021 Medical Rec #:  [id number]       Height:       67.0 in Accession #:    [id number]      Weight:       215.2 lb Date of Birth:  04-30-1951      BSA:          2.086 m Patient Age:    70 years        BP:           125/80 mmHg Patient Gender: M               HR:           80 bpm. Exam Location:  Inpatient Procedure: Transesophageal Echo Indications:     Bacteremia  History:         Patient has prior history of Echocardiogram examinations, most                  recent 09/09/2021.  Sonographer:     Merrie Roof RDCS Referring Phys:  [place] Diagnosing Phys: Kirk Ruths MD PROCEDURE: The transesophogeal probe was passed without difficulty through the esophogus of the patient. Sedation performed by different physician. The patient developed no complications during the procedure. IMPRESSIONS  1. No vegetations.  2. Left ventricular ejection fraction, by estimation, is 55 to 60%. The left ventricle has normal function.  3. Right ventricular systolic function is normal. The right ventricular size is normal.  4. No left atrial/left atrial appendage thrombus was detected.  5. The mitral valve is normal in structure. Trivial mitral valve regurgitation.  6. The aortic valve is tricuspid. Aortic valve regurgitation is not visualized.  7. Aortic dilatation noted. 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 FINDINGS  Left Ventricle: Left ventricular ejection fraction, by estimation, is 55 to 60%. The left ventricle has normal function. The left ventricular internal cavity size was normal in size. Right Ventricle: The right ventricular size is normal. Right ventricular systolic function is normal. Left Atrium: Left atrial size was normal in size. No left atrial/left atrial appendage thrombus was detected. Right Atrium: Right atrial size was normal in size. Pericardium: There is no evidence of pericardial effusion. Mitral Valve: The mitral valve is normal in structure. Trivial mitral valve regurgitation. Tricuspid Valve: The tricuspid valve is normal in structure. Tricuspid valve regurgitation is trivial. Aortic Valve: The aortic valve

## 2021-09-15 NOTE — Anesthesia Postprocedure Evaluation (Signed)
Anesthesia Post Note  Patient: Michael Hill  Procedure(s) Performed: TRANSESOPHAGEAL ECHOCARDIOGRAM (TEE)     Patient location during evaluation: Endoscopy Anesthesia Type: MAC Level of consciousness: awake and alert Pain management: pain level controlled Vital Signs Assessment: post-procedure vital signs reviewed and stable Respiratory status: spontaneous breathing, nonlabored ventilation, respiratory function stable and patient connected to nasal cannula oxygen Cardiovascular status: blood pressure returned to baseline and stable Postop Assessment: no apparent nausea or vomiting Anesthetic complications: no   No notable events documented.  Last Vitals:  Vitals:   09/14/21 1420 09/14/21 1444  BP: 116/85 129/66  Pulse: 89 87  Resp: 18 20  Temp:  36.6 C  SpO2: 98% 97%    Last Pain:  Vitals:   09/14/21 1607  TempSrc:   PainSc: 2    Pain Goal: Patients Stated Pain Goal: 0 (09/13/21 2202)                 Lyndi Holbein L Debarah Mccumbers

## 2021-09-15 NOTE — Anesthesia Preprocedure Evaluation (Signed)
Anesthesia Evaluation  Patient identified by MRN, date of birth, ID band Patient awake    Reviewed: Allergy & Precautions, NPO status , Patient's Chart, lab work & pertinent test results, reviewed documented beta blocker date and time   Airway Mallampati: II  TM Distance: >3 FB Neck ROM: Full    Dental no notable dental hx. (+) Teeth Intact, Dental Advisory Given   Pulmonary  COVID positive: symptoms include sinus congestion   Pulmonary exam normal breath sounds clear to auscultation       Cardiovascular hypertension, Pt. on home beta blockers and Pt. on medications Normal cardiovascular exam+ dysrhythmias Supra Ventricular Tachycardia  Rhythm:Regular Rate:Normal  TTE 2022 1. Left ventricular ejection fraction, by estimation, is 55 to 60%. The  left ventricle has normal function. The left ventricle has no regional  wall motion abnormalities. Left ventricular diastolic function could not  be evaluated.  2. Right ventricular systolic function was not well visualized. The right  ventricular size is not well visualized. There is normal pulmonary artery  systolic pressure.  3. Left atrial size was mildly dilated.  4. The mitral valve is normal in structure. Trivial mitral valve  regurgitation.  5. The aortic valve is normal in structure. Aortic valve regurgitation is  not visualized.    Neuro/Psych negative neurological ROS  negative psych ROS   GI/Hepatic negative GI ROS, Neg liver ROS,   Endo/Other  negative endocrine ROS  Renal/GU negative Renal ROS  negative genitourinary   Musculoskeletal  (+) Arthritis ,   Abdominal   Peds  Hematology negative hematology ROS (+)   Anesthesia Other Findings medical history significant of hypertension, hyperlipidemia, spondylolysis s/p L5/S1 decompression/fusion/exploration of adjacent adjacent level fusion with extension to pelvis on 08/25/2021 presents with complaints of a  wound infection found to have MSSA bacteremia  Reproductive/Obstetrics                             Anesthesia Physical Anesthesia Plan  ASA: 2  Anesthesia Plan: MAC   Post-op Pain Management:    Induction: Intravenous  PONV Risk Score and Plan: 1 and Propofol infusion and Treatment may vary due to age or medical condition  Airway Management Planned: Natural Airway  Additional Equipment:   Intra-op Plan:   Post-operative Plan:   Informed Consent: I have reviewed the patients History and Physical, chart, labs and discussed the procedure including the risks, benefits and alternatives for the proposed anesthesia with the patient or authorized representative who has indicated his/her understanding and acceptance.     Dental advisory given  Plan Discussed with: CRNA  Anesthesia Plan Comments:         Anesthesia Quick Evaluation

## 2021-09-16 DIAGNOSIS — I110 Hypertensive heart disease with heart failure: Secondary | ICD-10-CM | POA: Diagnosis not present

## 2021-09-16 DIAGNOSIS — T8144XA Sepsis following a procedure, initial encounter: Secondary | ICD-10-CM | POA: Diagnosis not present

## 2021-09-16 DIAGNOSIS — J9601 Acute respiratory failure with hypoxia: Secondary | ICD-10-CM | POA: Diagnosis not present

## 2021-09-16 DIAGNOSIS — I501 Left ventricular failure: Secondary | ICD-10-CM | POA: Diagnosis not present

## 2021-09-16 DIAGNOSIS — T8149XA Infection following a procedure, other surgical site, initial encounter: Secondary | ICD-10-CM | POA: Diagnosis not present

## 2021-09-16 DIAGNOSIS — B9561 Methicillin susceptible Staphylococcus aureus infection as the cause of diseases classified elsewhere: Secondary | ICD-10-CM | POA: Diagnosis not present

## 2021-09-16 DIAGNOSIS — U071 COVID-19: Secondary | ICD-10-CM | POA: Diagnosis not present

## 2021-09-16 DIAGNOSIS — J1282 Pneumonia due to coronavirus disease 2019: Secondary | ICD-10-CM | POA: Diagnosis not present

## 2021-09-16 DIAGNOSIS — R6520 Severe sepsis without septic shock: Secondary | ICD-10-CM | POA: Diagnosis not present

## 2021-09-20 ENCOUNTER — Encounter: Payer: Self-pay | Admitting: Internal Medicine

## 2021-09-20 DIAGNOSIS — T8140XA Infection following a procedure, unspecified, initial encounter: Secondary | ICD-10-CM | POA: Diagnosis not present

## 2021-09-20 DIAGNOSIS — T8144XA Sepsis following a procedure, initial encounter: Secondary | ICD-10-CM | POA: Diagnosis not present

## 2021-09-20 DIAGNOSIS — B9561 Methicillin susceptible Staphylococcus aureus infection as the cause of diseases classified elsewhere: Secondary | ICD-10-CM | POA: Diagnosis not present

## 2021-09-20 DIAGNOSIS — T8149XA Infection following a procedure, other surgical site, initial encounter: Secondary | ICD-10-CM | POA: Diagnosis not present

## 2021-09-20 DIAGNOSIS — R6520 Severe sepsis without septic shock: Secondary | ICD-10-CM | POA: Diagnosis not present

## 2021-09-20 DIAGNOSIS — Z792 Long term (current) use of antibiotics: Secondary | ICD-10-CM | POA: Diagnosis not present

## 2021-09-20 DIAGNOSIS — I110 Hypertensive heart disease with heart failure: Secondary | ICD-10-CM | POA: Diagnosis not present

## 2021-09-20 DIAGNOSIS — U071 COVID-19: Secondary | ICD-10-CM | POA: Diagnosis not present

## 2021-09-20 DIAGNOSIS — J1282 Pneumonia due to coronavirus disease 2019: Secondary | ICD-10-CM | POA: Diagnosis not present

## 2021-09-20 DIAGNOSIS — I501 Left ventricular failure: Secondary | ICD-10-CM | POA: Diagnosis not present

## 2021-09-20 DIAGNOSIS — J9601 Acute respiratory failure with hypoxia: Secondary | ICD-10-CM | POA: Diagnosis not present

## 2021-09-20 DIAGNOSIS — A419 Sepsis, unspecified organism: Secondary | ICD-10-CM | POA: Diagnosis not present

## 2021-09-22 ENCOUNTER — Inpatient Hospital Stay (HOSPITAL_COMMUNITY)
Admission: EM | Admit: 2021-09-22 | Discharge: 2021-10-01 | DRG: 856 | Disposition: A | Discharge status: Home Health | Payer: Medicare PPO | Attending: Internal Medicine | Admitting: Internal Medicine

## 2021-09-22 ENCOUNTER — Emergency Department (HOSPITAL_COMMUNITY): Payer: Medicare PPO

## 2021-09-22 ENCOUNTER — Other Ambulatory Visit: Payer: Self-pay

## 2021-09-22 ENCOUNTER — Encounter (HOSPITAL_COMMUNITY): Payer: Self-pay

## 2021-09-22 DIAGNOSIS — Z8249 Family history of ischemic heart disease and other diseases of the circulatory system: Secondary | ICD-10-CM

## 2021-09-22 DIAGNOSIS — E7800 Pure hypercholesterolemia, unspecified: Secondary | ICD-10-CM | POA: Diagnosis present

## 2021-09-22 DIAGNOSIS — M549 Dorsalgia, unspecified: Secondary | ICD-10-CM | POA: Diagnosis not present

## 2021-09-22 DIAGNOSIS — Z83438 Family history of other disorder of lipoprotein metabolism and other lipidemia: Secondary | ICD-10-CM

## 2021-09-22 DIAGNOSIS — M5416 Radiculopathy, lumbar region: Secondary | ICD-10-CM | POA: Diagnosis present

## 2021-09-22 DIAGNOSIS — D638 Anemia in other chronic diseases classified elsewhere: Secondary | ICD-10-CM | POA: Diagnosis present

## 2021-09-22 DIAGNOSIS — L0291 Cutaneous abscess, unspecified: Secondary | ICD-10-CM

## 2021-09-22 DIAGNOSIS — Z20822 Contact with and (suspected) exposure to covid-19: Secondary | ICD-10-CM | POA: Diagnosis present

## 2021-09-22 DIAGNOSIS — M4620 Osteomyelitis of vertebra, site unspecified: Secondary | ICD-10-CM | POA: Diagnosis not present

## 2021-09-22 DIAGNOSIS — M79643 Pain in unspecified hand: Secondary | ICD-10-CM | POA: Diagnosis not present

## 2021-09-22 DIAGNOSIS — A419 Sepsis, unspecified organism: Secondary | ICD-10-CM | POA: Diagnosis not present

## 2021-09-22 DIAGNOSIS — D649 Anemia, unspecified: Secondary | ICD-10-CM

## 2021-09-22 DIAGNOSIS — M545 Low back pain, unspecified: Secondary | ICD-10-CM | POA: Diagnosis not present

## 2021-09-22 DIAGNOSIS — I10 Essential (primary) hypertension: Secondary | ICD-10-CM | POA: Diagnosis present

## 2021-09-22 DIAGNOSIS — R509 Fever, unspecified: Secondary | ICD-10-CM | POA: Diagnosis not present

## 2021-09-22 DIAGNOSIS — G8929 Other chronic pain: Secondary | ICD-10-CM | POA: Diagnosis present

## 2021-09-22 DIAGNOSIS — E669 Obesity, unspecified: Secondary | ICD-10-CM | POA: Diagnosis present

## 2021-09-22 DIAGNOSIS — T847XXA Infection and inflammatory reaction due to other internal orthopedic prosthetic devices, implants and grafts, initial encounter: Secondary | ICD-10-CM

## 2021-09-22 DIAGNOSIS — E876 Hypokalemia: Secondary | ICD-10-CM | POA: Diagnosis not present

## 2021-09-22 DIAGNOSIS — Z8616 Personal history of COVID-19: Secondary | ICD-10-CM

## 2021-09-22 DIAGNOSIS — B9561 Methicillin susceptible Staphylococcus aureus infection as the cause of diseases classified elsewhere: Secondary | ICD-10-CM

## 2021-09-22 DIAGNOSIS — M79641 Pain in right hand: Secondary | ICD-10-CM | POA: Diagnosis not present

## 2021-09-22 DIAGNOSIS — T8140XA Infection following a procedure, unspecified, initial encounter: Secondary | ICD-10-CM | POA: Diagnosis not present

## 2021-09-22 DIAGNOSIS — Y838 Other surgical procedures as the cause of abnormal reaction of the patient, or of later complication, without mention of misadventure at the time of the procedure: Secondary | ICD-10-CM | POA: Diagnosis present

## 2021-09-22 DIAGNOSIS — Z981 Arthrodesis status: Secondary | ICD-10-CM

## 2021-09-22 DIAGNOSIS — R7881 Bacteremia: Secondary | ICD-10-CM

## 2021-09-22 DIAGNOSIS — T8149XA Infection following a procedure, other surgical site, initial encounter: Secondary | ICD-10-CM | POA: Diagnosis not present

## 2021-09-22 DIAGNOSIS — M48061 Spinal stenosis, lumbar region without neurogenic claudication: Secondary | ICD-10-CM | POA: Diagnosis present

## 2021-09-22 DIAGNOSIS — R630 Anorexia: Secondary | ICD-10-CM

## 2021-09-22 DIAGNOSIS — G061 Intraspinal abscess and granuloma: Secondary | ICD-10-CM | POA: Diagnosis present

## 2021-09-22 DIAGNOSIS — E871 Hypo-osmolality and hyponatremia: Secondary | ICD-10-CM | POA: Diagnosis not present

## 2021-09-22 DIAGNOSIS — Z6831 Body mass index (BMI) 31.0-31.9, adult: Secondary | ICD-10-CM

## 2021-09-22 DIAGNOSIS — K219 Gastro-esophageal reflux disease without esophagitis: Secondary | ICD-10-CM | POA: Diagnosis present

## 2021-09-22 DIAGNOSIS — Z79899 Other long term (current) drug therapy: Secondary | ICD-10-CM

## 2021-09-22 LAB — COMPREHENSIVE METABOLIC PANEL
ALT: 7 U/L (ref 0–44)
AST: 24 U/L (ref 15–41)
Albumin: 2 g/dL — ABNORMAL LOW (ref 3.5–5.0)
Alkaline Phosphatase: 66 U/L (ref 38–126)
Anion gap: 10 (ref 5–15)
BUN: 9 mg/dL (ref 8–23)
CO2: 27 mmol/L (ref 22–32)
Calcium: 8.7 mg/dL — ABNORMAL LOW (ref 8.9–10.3)
Chloride: 92 mmol/L — ABNORMAL LOW (ref 98–111)
Creatinine, Ser: 0.8 mg/dL (ref 0.61–1.24)
GFR, Estimated: >60 mL/min (ref >60)
Glucose, Bld: 114 mg/dL — ABNORMAL HIGH (ref 70–99)
Potassium: 4.6 mmol/L (ref 3.5–5.1)
Sodium: 129 mmol/L — ABNORMAL LOW (ref 135–145)
Total Bilirubin: 0.6 mg/dL (ref 0.3–1.2)
Total Protein: 6.2 g/dL — ABNORMAL LOW (ref 6.5–8.1)

## 2021-09-22 LAB — CBC WITH DIFFERENTIAL/PLATELET
Abs Immature Granulocytes: 0.03 10*3/uL (ref 0.00–0.07)
Basophils Absolute: 0.1 10*3/uL (ref 0.0–0.1)
Basophils Relative: 1 %
Eosinophils Absolute: 0 10*3/uL (ref 0.0–0.5)
Eosinophils Relative: 0 %
HCT: 30.9 % — ABNORMAL LOW (ref 39.0–52.0)
Hemoglobin: 9.8 g/dL — ABNORMAL LOW (ref 13.0–17.0)
Immature Granulocytes: 0 %
Lymphocytes Relative: 22 %
Lymphs Abs: 1.9 10*3/uL (ref 0.7–4.0)
MCH: 28.5 pg (ref 26.0–34.0)
MCHC: 31.7 g/dL (ref 30.0–36.0)
MCV: 89.8 fL (ref 80.0–100.0)
Monocytes Absolute: 0.8 10*3/uL (ref 0.1–1.0)
Monocytes Relative: 10 %
Neutro Abs: 5.9 10*3/uL (ref 1.7–7.7)
Neutrophils Relative %: 67 %
Platelets: 314 10*3/uL (ref 150–400)
RBC: 3.44 MIL/uL — ABNORMAL LOW (ref 4.22–5.81)
RDW: 14 % (ref 11.5–15.5)
WBC: 8.7 10*3/uL (ref 4.0–10.5)
nRBC: 0 % (ref 0.0–0.2)

## 2021-09-22 MED ORDER — IOHEXOL 350 MG/ML SOLN
75.0000 mL | Freq: Once | INTRAVENOUS | Status: AC | PRN
  Administered 2021-09-22: 75 mL via INTRAVENOUS

## 2021-09-22 MED ORDER — ACETAMINOPHEN 325 MG PO TABS
650.0000 mg | ORAL_TABLET | Freq: Once | ORAL | Status: AC
  Administered 2021-09-22: 650 mg via ORAL
  Filled 2021-09-22: qty 2

## 2021-09-22 MED ORDER — MORPHINE SULFATE (PF) 4 MG/ML IV SOLN
4.0000 mg | Freq: Once | INTRAVENOUS | Status: AC
  Administered 2021-09-22: 4 mg via INTRAVENOUS
  Filled 2021-09-22: qty 1

## 2021-09-22 MED ORDER — SODIUM CHLORIDE 0.9 % IV BOLUS
1000.0000 mL | Freq: Once | INTRAVENOUS | Status: AC
  Administered 2021-09-22: 1000 mL via INTRAVENOUS

## 2021-09-22 MED ORDER — KETOROLAC TROMETHAMINE 30 MG/ML IJ SOLN
15.0000 mg | Freq: Once | INTRAMUSCULAR | Status: AC
  Administered 2021-09-22: 15 mg via INTRAVENOUS
  Filled 2021-09-22: qty 1

## 2021-09-22 NOTE — ED Provider Notes (Signed)
[facility] EMERGENCY DEPARTMENT Provider Note   CSN: [id number] Arrival date & time: 09/22/21  1930     History Chief Complaint  Patient presents with   Post-op Problem    Michael Hill is a 70 y.o. male.  HPI Patient is a 70 year old male presenting today with complaints of 2 days of weakness in right leg progressively worse now states he is unable to move his right leg  Past medical history detailed below  Has a past medical history significant for laminectomy that was complicated by postoperative infection and required incision and drainage 10/5 patient was discharged home on cefazolin and rifampin through PICC line.   States that over the past 2 days he has developed severe back pain and weakness intermittently.  Denies any saddle anesthesia denies any numbness and denies any fevers or chills cough congestion.  Describes the pain is severe constant 10/10 associated with weakness of his right leg.  Worse with touch and movement Taking 4 Percocet daily for his pain as recommended by neurosurgeon.  No lightheadedness or dizziness syncope or near syncope.  States he is peeing and pooping normally.  Eating and drinking well.       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   Pedal edema 09/10/2021   SVT (supraventricular tachycardia) ([place]) 09/10/2021   Hypophosphatemia 09/10/2021   Severe Sepsis due to methicillin susceptible Staphylococcus aureus (MSSA) with acute hypoxic respiratory failure without septic shock ([place]) 09/09/2021   Hyponatremia 09/09/2021   Postoperative wound infection 09/08/2021   Acute respiratory failure with hypoxia ([place]) 09/08/2021    COVID-19 virus infection 09/08/2021   Hypotension 09/08/2021   Postoperative anemia 09/08/2021   Elevated AST (SGOT) 09/08/2021   Obesity (BMI 30.0-34.9) 09/08/2021   Lumbar pseudoarthrosis 08/24/2021   Lumbar spine scoliosis 08/05/2014   DDD (degenerative disc disease), lumbar 07/23/2014   HYPERCHOLESTEROLEMIA 02/01/2007   RHINITIS, ALLERGIC 02/01/2007   DJD, UNSPECIFIED 02/01/2007   BACK PAIN W/RADIATION, UNSPECIFIED 02/01/2007    Past Surgical History:  Procedure Laterality Date   ABDOMINAL EXPOSURE N/A 08/05/2014   Procedure: ABDOMINAL EXPOSURE;  Surgeon: Angelia Mould, MD;  Location: [facility];  Service: Vascular;  Laterality: N/A;   ANTERIOR LAT LUMBAR FUSION Right 08/05/2014   Procedure: Right Lumbar two-three,Lumbar three-four,Lumbar four-five Anterior lateral lumbar interbody fusion;  Surgeon: Erline Levine, MD;  Location: [place] NEUR[facility] ORS;  Service: Neurosurgery;  Laterality: Right;  right    ANTERIOR LUMBAR FUSION N/A 08/05/2014   Procedure: Lumbar five-Sacral-One Anterior lumbar interbody fusion with Dr. Scot Dock for approach; Right Lumbar two-three,Lumbar three-four,Lumbar four-five Anterior lateral lumbar interbody fusion;  Surgeon: Erline Levine, MD;  Location: [place] NEURO ORS;  Service: Neurosurgery;  Laterality: N/A;   APPLICATION OF INTRAOPERATIVE CT SCAN N/A 08/24/2021   Procedure: APPLICATION OF INTRAOPERATIVE CT SCAN;  Surgeon: Erline Levine, MD;  Location: [place];  Service: Neurosurgery;  Laterality: N/A;   COLONOSCOPY     EPIDURAL BLOCK INJECTION     KNEE ARTHROSCOPY Bilateral    x 4    LUMBAR FUSION  08/05/2014        DR Vertell Limber  LUMBAR PERCUTANEOUS PEDICLE SCREW 3 LEVEL N/A 08/07/2014   Procedure: Stage II Lumbar percutaneous pedicle screw placement L2-S1;  Surgeon: Erline Levine, MD;  Location: [place] NEUR[facility] ORS;  Service: Neurosurgery;  Laterality: N/A;  Stage II Lumbar percutaneous pedicle screw placement L2-S1   LUMBAR WOUND DEBRIDEMENT N/A 09/08/2021   Procedure:  Incision and drainage of lumbar wound;  Surgeon: Erline Levine, MD;  Location: [place];  Service: Neurosurgery;  Laterality: N/A;   PROSTATE BIOPSY     ROTATOR CUFF REPAIR Right    TEE WITHOUT CARDIOVERSION N/A 09/14/2021   Procedure: TRANSESOPHAGEAL ECHOCARDIOGRAM (TEE);  Surgeon: Lelon Perla, MD;  Location[facility];  Service: Cardiovascular;  Laterality: N/A;       Family History  Problem Relation Age of Onset   Heart disease Father        before age 49   Hyperlipidemia Father    Hypertension Father    Hypertension Brother    Heart disease Brother        before age 54   Cancer Daughter     Social History   Tobacco Use   Smoking status: Never   Smokeless tobacco: Never  Vaping Use   Vaping Use: Never used  Substance Use Topics   Alcohol use: No   Drug use: No    Home Medications Prior to Admission medications   Medication Sig Start Date End Date Taking? Authorizing Provider  acetaminophen (TYLENOL) 325 MG tablet Take 325 mg by mouth every 6 (six) hours as needed for moderate pain or headache.    [provider]  ascorbic acid (VITAMIN C) 500 MG tablet Take 1 tablet (500 mg total) by mouth daily. 09/15/21   Lavina Hamman, MD  atorvastatin (LIPITOR) 20 MG tablet Take 20 mg by mouth daily.    [provider]  ceFAZolin (ANCEF) IVPB Inject 2 g into the vein every 8 (eight) hours. Indication:  MSSA Bacteremia/Lumbar Infection First Dose: Yes Last Day of Therapy:  10/20/21 Labs - Once weekly:  CBC/D and BMP, Labs - Every other week:  ESR and CRP Method of administration: IV Push Method of administration may be changed at the discretion of home infusion pharmacist based upon assessment of the patient and/or caregiver's ability to self-administer the medication ordered. 09/13/21 10/20/21  Lavina Hamman, MD  docusate sodium (COLACE) 100 MG capsule Take 1 capsule (100 mg total) by mouth 2 (two) times daily. 09/14/21   Lavina Hamman, MD   famotidine (PEPCID) 20 MG tablet Take 1 tablet (20 mg total) by mouth daily. 09/15/21   Lavina Hamman, MD  methocarbamol (ROBAXIN) 500 MG tablet Take 1 tablet (500 mg total) by mouth every 6 (six) hours as needed for muscle spasms. 08/25/21   Marvis Moeller, NP  metoprolol succinate (TOPROL XL) 50 MG 24 hr tablet Take 1 tablet (50 mg total) by mouth daily. Take with or immediately following a meal. 09/15/21 09/15/22  Lavina Hamman, MD  oxyCODONE (OXY IR/ROXICODONE) 5 MG immediate release tablet Take 5 mg by mouth 3 (three) times daily as needed for moderate pain.    [provider]  oxyCODONE (OXYCONTIN) 10 mg 12 hr tablet Take 10 mg by mouth every 12 (twelve) hours.    [provider]  pantoprazole (PROTONIX) 40 MG tablet Take 1 tablet (40 mg total) by mouth daily. 09/15/21   Lavina Hamman, MD  rifampin (RIFADIN) 300 MG capsule Take 1 capsule (300 mg total) by mouth 2 (two)  times daily. 09/13/21 12/12/21  Lavina Hamman, MD  zinc sulfate 220 (50 Zn) MG capsule Take 1 capsule (220 mg total) by mouth daily. 09/15/21   Lavina Hamman, MD    Allergies    Patient has no known allergies.  Review of Systems   Review of Systems  Constitutional:  Negative for chills and fever.  HENT:  Negative for congestion.   Eyes:  Negative for pain.  Respiratory:  Negative for cough and shortness of breath.   Cardiovascular:  Negative for chest pain and leg swelling.  Gastrointestinal:  Negative for abdominal pain and vomiting.  Genitourinary:  Negative for dysuria.  Musculoskeletal:  Positive for back pain. Negative for myalgias.  Skin:  Negative for rash.  Neurological:  Negative for dizziness and headaches.   Physical Exam Updated Vital Signs BP 107/60   Pulse (!) 107   Temp 98.4 F (36.9 C) (Oral)   Resp (!) 23   Ht $R'5\' 7"'Px$  (1.702 m)   Wt 90.7 kg   SpO2 96%   BMI 31.32 kg/m   Physical Exam Vitals and nursing note reviewed.  Constitutional:      General: He is not  in acute distress. HENT:     Head: Normocephalic and atraumatic.     Nose: Nose normal.  Eyes:     General: No scleral icterus. Cardiovascular:     Rate and Rhythm: Normal rate and regular rhythm.     Pulses: Normal pulses.     Heart sounds: Normal heart sounds.  Pulmonary:     Effort: Pulmonary effort is normal. No respiratory distress.     Breath sounds: No wheezing.  Abdominal:     Palpations: Abdomen is soft.     Tenderness: There is no abdominal tenderness.  Musculoskeletal:     Cervical back: Normal range of motion.     Right lower leg: No edema.     Left lower leg: No edema.     Comments: Significant weakness of the right lower extremity with flexion of the hip and flexion or extension of the knee.  Able to flex at the ankle and with 5/5 strength.  Patellar reflexes intact BL  Skin:    General: Skin is warm and dry.     Capillary Refill: Capillary refill takes less than 2 seconds.  Neurological:     Mental Status: He is alert. Mental status is at baseline.  Psychiatric:        Mood and Affect: Mood normal.        Behavior: Behavior normal.       ED Results / Procedures / Treatments   Labs (all labs ordered are listed, but only abnormal results are displayed) Labs Reviewed  CBC WITH DIFFERENTIAL/PLATELET - Abnormal; Notable for the following components:      Result Value   RBC 3.44 (*)    Hemoglobin 9.8 (*)    HCT 30.9 (*)    All other components within normal limits  COMPREHENSIVE METABOLIC PANEL - Abnormal; Notable for the following components:   Sodium 129 (*)    Chloride 92 (*)    Glucose, Bld 114 (*)    Calcium 8.7 (*)    Total Protein 6.2 (*)    Albumin 2.0 (*)    All other components within normal limits  CULTURE, BLOOD (ROUTINE X 2)  CULTURE, BLOOD (ROUTINE X 2)  URINALYSIS, ROUTINE W REFLEX MICROSCOPIC  SEDIMENTATION RATE  C-REACTIVE PROTEIN    EKG None  Radiology CT LUMBAR  SPINE W CONTRAST  Result Date: 09/22/2021 CLINICAL DATA:  Low  back pain, infection suspected, history of low back pain and abscess after laminectomy. EXAM: CT LUMBAR SPINE WITH CONTRAST TECHNIQUE: Multidetector CT imaging of the lumbar spine was performed with intravenous contrast administration. CONTRAST:  69mL OMNIPAQUE IOHEXOL 350 MG/ML SOLN COMPARISON:  09/08/2021 FINDINGS: Segmentation: 5 lumbar type vertebrae. Alignment: Levocurvature of the lumbar spine. Unchanged mild retrolisthesis L1 on L2. Vertebrae: No acute fracture. Status post L2-bi iliac fusion with ALIF at L5-S1, with no evidence of paraspinous lucency or hardware fracture. Paraspinal and other soft tissues: Peripherally enhancing fluid collection with foci of air in the subcutaneous soft tissues posterior to the lumbar spine, extending from the level of L2 to S3, measuring up to 7.9 x 5.7 x 15.6 cm (AP x TR x CC) (series 5, image 99 and series 9, image 54), previously 3.7 x 4.7 x 17.6 cm. The collection now extends into the paraspinous musculature and up to the spinal hardware from L4 through S2, most prominently at L5, where air and fluid are seen between the spinous process and left spinal rod. Additional subcutaneous edema extends from the thoracolumbar junction to the inferior sacrum. Focal skin defect posterior to L2 (series 5, image 52). Disc levels: Redemonstrated disc height loss and vacuum disc phenomenon at T11-T12, T12-L1, and L1-L2, without significant spinal canal stenosis. IMPRESSION: Interval increase in the size and complexity of a fluid collection in the soft tissues posterior to the lumbosacral operative bed, with peripheral enhancement and internal foci of air, concerning for abscess with gas-forming organisms versus air entering from an open skin wound posterior to L2. The collection extends into the paraspinous musculature from L4 through S2, and, at the level of L5, it extends anteriorly to the space between the left spinal rod and spinous process. Electronically Signed   By: Merilyn Baba M.D.   On: 09/22/2021 23:56    Procedures .Critical Care Performed by: Tedd Sias, PA Authorized by: Tedd Sias, PA   Critical care provider statement:    Critical care time (minutes):  35   Critical care time was exclusive of:  Separately billable procedures and treating other patients and teaching time   Critical care was necessary to treat or prevent imminent or life-threatening deterioration of the following conditions: Severe infection complicated by location near the CNS and hemiparalysis.   Critical care was time spent personally by me on the following activities:  Development of treatment plan with patient or surrogate, review of old charts, re-evaluation of patient's condition, pulse oximetry, ordering and review of radiographic studies, ordering and review of laboratory studies, ordering and performing treatments and interventions, obtaining history from patient or surrogate, examination of patient and evaluation of patient's response to treatment   Care discussed with: admitting provider     Medications Ordered in ED Medications  vancomycin (VANCOREADY) IVPB 1500 mg/300 mL (has no administration in time range)  ceFEPIme (MAXIPIME) 2 g in sodium chloride 0.9 % 100 mL IVPB (has no administration in time range)  morphine 4 MG/ML injection 4 mg (4 mg Intravenous Given 09/22/21 2049)  sodium chloride 0.9 % bolus 1,000 mL (1,000 mLs Intravenous New Bag/Given 09/22/21 2046)  morphine 4 MG/ML injection 4 mg (4 mg Intravenous Given 09/22/21 2321)  ketorolac (TORADOL) 30 MG/ML injection 15 mg (15 mg Intravenous Given 09/22/21 2321)  acetaminophen (TYLENOL) tablet 650 mg (650 mg Oral Given 09/22/21 2319)  iohexol (OMNIPAQUE) 350 MG/ML injection 75 mL (75 mLs  Intravenous Contrast Given 09/22/21 2246)    ED Course  I have reviewed the triage vital signs and the nursing notes.  Pertinent labs & imaging results that were available during my care of the patient were reviewed  by me and considered in my medical decision making (see chart for details).  Clinical Course as of 09/23/21 0026  Wed Sep 22, 2021  2308 Sodium mildly low at 129.  Patient is mentating well.  Mildly low chloride of 92.  Patient does appear very slightly dehydrated.  We will provide patient with gentle fluids 200 mL/h and analgesics [WF]  2309 CBC without leukocytosis.  Ongoing anemia hemoglobin today 9.8 seems baseline around 10 [WF]  Thu Sep 23, 2021  0004 Discussed with Glenford Peers who will see after MRI.  [WF]  0025 Discussed with Dr. Marlowe Sax who will admit [WF]    Clinical Course User Index [WF] Tedd Sias, PA   MDM Rules/Calculators/A&P                          Patient is a 70 year old gentleman with past medical history significant for complications of laminectomy required incision and drainage 10/5 with started on broad-spectrum antibiotics and treated for sepsis at this time  Symptoms seem to improve he was discharged home with ability to walk with walker however 2 days ago he developed inability to move his right leg because of severe pain now he feels that he has difficulty moving at all.  CBC without leukocytosis some anemia CMP with mild hyponatremia hypochloremia  CT lumbar spine obtained which shows interval increase in the degree of size and complexity of the fluid collection soft tissues posterior to the lumbosacral operative bed there is some free air in this area finding concern for gas-forming organism.  Broad-spectrum antibiotics ordered after blood cultures x2.  ESR CRP added.  Urinalysis will be obtained.  MRI with and without contrast ordered.  Patient updated on findings.  Discussed with neurosurgery APP and hospitalist to admit.   Final Clinical Impression(s) / ED Diagnoses Final diagnoses:  None    Rx / DC Orders ED Discharge Orders     None        Tedd Sias, [place] 09/23/21 0031    Lorelle Gibbs, DO 09/24/21 1538

## 2021-09-22 NOTE — ED Notes (Signed)
Pt at CT

## 2021-09-22 NOTE — ED Triage Notes (Signed)
PER EMS: pt is from home, had recent back surgery last month but has had back pain and worsening mobility since as well as recent staph infection earlier this month requiring hospitalization. Tonight the back pain and mobility is worse.  BP-130/70, HR - 84, RR-16, 98% RA.

## 2021-09-23 ENCOUNTER — Encounter (HOSPITAL_COMMUNITY): Payer: Self-pay | Admitting: Emergency Medicine

## 2021-09-23 ENCOUNTER — Inpatient Hospital Stay (HOSPITAL_COMMUNITY): Payer: Medicare PPO

## 2021-09-23 DIAGNOSIS — Z6831 Body mass index (BMI) 31.0-31.9, adult: Secondary | ICD-10-CM | POA: Diagnosis not present

## 2021-09-23 DIAGNOSIS — M5416 Radiculopathy, lumbar region: Secondary | ICD-10-CM | POA: Diagnosis present

## 2021-09-23 DIAGNOSIS — T8140XA Infection following a procedure, unspecified, initial encounter: Secondary | ICD-10-CM | POA: Diagnosis not present

## 2021-09-23 DIAGNOSIS — D649 Anemia, unspecified: Secondary | ICD-10-CM

## 2021-09-23 DIAGNOSIS — Y838 Other surgical procedures as the cause of abnormal reaction of the patient, or of later complication, without mention of misadventure at the time of the procedure: Secondary | ICD-10-CM | POA: Diagnosis present

## 2021-09-23 DIAGNOSIS — Z8249 Family history of ischemic heart disease and other diseases of the circulatory system: Secondary | ICD-10-CM | POA: Diagnosis not present

## 2021-09-23 DIAGNOSIS — T8149XA Infection following a procedure, other surgical site, initial encounter: Secondary | ICD-10-CM | POA: Diagnosis not present

## 2021-09-23 DIAGNOSIS — M79642 Pain in left hand: Secondary | ICD-10-CM | POA: Diagnosis not present

## 2021-09-23 DIAGNOSIS — A419 Sepsis, unspecified organism: Secondary | ICD-10-CM | POA: Diagnosis not present

## 2021-09-23 DIAGNOSIS — I471 Supraventricular tachycardia: Secondary | ICD-10-CM | POA: Diagnosis not present

## 2021-09-23 DIAGNOSIS — T8142XA Infection following a procedure, deep incisional surgical site, initial encounter: Secondary | ICD-10-CM | POA: Diagnosis not present

## 2021-09-23 DIAGNOSIS — L0291 Cutaneous abscess, unspecified: Secondary | ICD-10-CM | POA: Diagnosis not present

## 2021-09-23 DIAGNOSIS — G8929 Other chronic pain: Secondary | ICD-10-CM | POA: Diagnosis present

## 2021-09-23 DIAGNOSIS — K219 Gastro-esophageal reflux disease without esophagitis: Secondary | ICD-10-CM | POA: Diagnosis present

## 2021-09-23 DIAGNOSIS — M4620 Osteomyelitis of vertebra, site unspecified: Secondary | ICD-10-CM | POA: Diagnosis not present

## 2021-09-23 DIAGNOSIS — M79641 Pain in right hand: Secondary | ICD-10-CM | POA: Diagnosis not present

## 2021-09-23 DIAGNOSIS — E876 Hypokalemia: Secondary | ICD-10-CM | POA: Diagnosis not present

## 2021-09-23 DIAGNOSIS — M79602 Pain in left arm: Secondary | ICD-10-CM | POA: Diagnosis not present

## 2021-09-23 DIAGNOSIS — Z20822 Contact with and (suspected) exposure to covid-19: Secondary | ICD-10-CM | POA: Diagnosis not present

## 2021-09-23 DIAGNOSIS — M1812 Unilateral primary osteoarthritis of first carpometacarpal joint, left hand: Secondary | ICD-10-CM | POA: Diagnosis not present

## 2021-09-23 DIAGNOSIS — Z83438 Family history of other disorder of lipoprotein metabolism and other lipidemia: Secondary | ICD-10-CM | POA: Diagnosis not present

## 2021-09-23 DIAGNOSIS — I10 Essential (primary) hypertension: Secondary | ICD-10-CM | POA: Diagnosis not present

## 2021-09-23 DIAGNOSIS — T847XXD Infection and inflammatory reaction due to other internal orthopedic prosthetic devices, implants and grafts, subsequent encounter: Secondary | ICD-10-CM | POA: Diagnosis not present

## 2021-09-23 DIAGNOSIS — B9561 Methicillin susceptible Staphylococcus aureus infection as the cause of diseases classified elsewhere: Secondary | ICD-10-CM | POA: Diagnosis not present

## 2021-09-23 DIAGNOSIS — R7881 Bacteremia: Secondary | ICD-10-CM | POA: Diagnosis not present

## 2021-09-23 DIAGNOSIS — D638 Anemia in other chronic diseases classified elsewhere: Secondary | ICD-10-CM | POA: Diagnosis not present

## 2021-09-23 DIAGNOSIS — E7800 Pure hypercholesterolemia, unspecified: Secondary | ICD-10-CM | POA: Diagnosis not present

## 2021-09-23 DIAGNOSIS — R000 Tachycardia, unspecified: Secondary | ICD-10-CM | POA: Diagnosis not present

## 2021-09-23 DIAGNOSIS — E669 Obesity, unspecified: Secondary | ICD-10-CM | POA: Diagnosis not present

## 2021-09-23 DIAGNOSIS — G061 Intraspinal abscess and granuloma: Secondary | ICD-10-CM | POA: Diagnosis not present

## 2021-09-23 DIAGNOSIS — Z79899 Other long term (current) drug therapy: Secondary | ICD-10-CM | POA: Diagnosis not present

## 2021-09-23 DIAGNOSIS — Z8616 Personal history of COVID-19: Secondary | ICD-10-CM | POA: Diagnosis not present

## 2021-09-23 DIAGNOSIS — M48061 Spinal stenosis, lumbar region without neurogenic claudication: Secondary | ICD-10-CM | POA: Diagnosis present

## 2021-09-23 DIAGNOSIS — M19042 Primary osteoarthritis, left hand: Secondary | ICD-10-CM | POA: Diagnosis not present

## 2021-09-23 DIAGNOSIS — M4326 Fusion of spine, lumbar region: Secondary | ICD-10-CM | POA: Diagnosis not present

## 2021-09-23 DIAGNOSIS — M7989 Other specified soft tissue disorders: Secondary | ICD-10-CM | POA: Diagnosis not present

## 2021-09-23 DIAGNOSIS — T8463XA Infection and inflammatory reaction due to internal fixation device of spine, initial encounter: Secondary | ICD-10-CM | POA: Diagnosis not present

## 2021-09-23 DIAGNOSIS — R630 Anorexia: Secondary | ICD-10-CM | POA: Diagnosis not present

## 2021-09-23 DIAGNOSIS — Z981 Arthrodesis status: Secondary | ICD-10-CM | POA: Diagnosis not present

## 2021-09-23 DIAGNOSIS — S21201A Unspecified open wound of right back wall of thorax without penetration into thoracic cavity, initial encounter: Secondary | ICD-10-CM | POA: Diagnosis not present

## 2021-09-23 DIAGNOSIS — E871 Hypo-osmolality and hyponatremia: Secondary | ICD-10-CM | POA: Diagnosis not present

## 2021-09-23 LAB — CBC
HCT: 30.9 % — ABNORMAL LOW (ref 39.0–52.0)
Hemoglobin: 9.7 g/dL — ABNORMAL LOW (ref 13.0–17.0)
MCH: 28.9 pg (ref 26.0–34.0)
MCHC: 31.4 g/dL (ref 30.0–36.0)
MCV: 92 fL (ref 80.0–100.0)
Platelets: 352 10*3/uL (ref 150–400)
RBC: 3.36 MIL/uL — ABNORMAL LOW (ref 4.22–5.81)
RDW: 13.9 % (ref 11.5–15.5)
WBC: 9.2 10*3/uL (ref 4.0–10.5)
nRBC: 0 % (ref 0.0–0.2)

## 2021-09-23 LAB — BASIC METABOLIC PANEL
Anion gap: 12 (ref 5–15)
BUN: 10 mg/dL (ref 8–23)
CO2: 24 mmol/L (ref 22–32)
Calcium: 8.6 mg/dL — ABNORMAL LOW (ref 8.9–10.3)
Chloride: 94 mmol/L — ABNORMAL LOW (ref 98–111)
Creatinine, Ser: 0.88 mg/dL (ref 0.61–1.24)
GFR, Estimated: >60 mL/min (ref >60)
Glucose, Bld: 109 mg/dL — ABNORMAL HIGH (ref 70–99)
Potassium: 4.6 mmol/L (ref 3.5–5.1)
Sodium: 130 mmol/L — ABNORMAL LOW (ref 135–145)

## 2021-09-23 LAB — C-REACTIVE PROTEIN: CRP: 21.4 mg/dL — ABNORMAL HIGH (ref <1.0)

## 2021-09-23 LAB — OSMOLALITY: Osmolality: 270 mOsm/kg — ABNORMAL LOW (ref 275–295)

## 2021-09-23 LAB — URINALYSIS, ROUTINE W REFLEX MICROSCOPIC
Bilirubin Urine: NEGATIVE
Glucose, UA: NEGATIVE mg/dL
Hgb urine dipstick: NEGATIVE
Ketones, ur: NEGATIVE mg/dL
Leukocytes,Ua: NEGATIVE
Nitrite: NEGATIVE
Protein, ur: NEGATIVE mg/dL
Specific Gravity, Urine: 1.015 (ref 1.005–1.030)
pH: 6 (ref 5.0–8.0)

## 2021-09-23 LAB — TYPE AND SCREEN
ABO/RH(D): A POS
Antibody Screen: NEGATIVE

## 2021-09-23 LAB — SEDIMENTATION RATE: Sed Rate: 126 mm/hr — ABNORMAL HIGH (ref 0–16)

## 2021-09-23 MED ORDER — LORAZEPAM 2 MG/ML IJ SOLN
1.0000 mg | Freq: Once | INTRAMUSCULAR | Status: AC
  Administered 2021-09-23: 1 mg via INTRAVENOUS
  Filled 2021-09-23: qty 1

## 2021-09-23 MED ORDER — SODIUM CHLORIDE 0.9 % IV SOLN
2.0000 g | Freq: Once | INTRAVENOUS | Status: AC
  Administered 2021-09-23: 2 g via INTRAVENOUS
  Filled 2021-09-23: qty 2

## 2021-09-23 MED ORDER — SODIUM CHLORIDE 0.9 % IV SOLN
2.0000 g | Freq: Three times a day (TID) | INTRAVENOUS | Status: DC
  Administered 2021-09-23: 2 g via INTRAVENOUS
  Filled 2021-09-23: qty 2

## 2021-09-23 MED ORDER — OXYCODONE HCL ER 10 MG PO T12A
10.0000 mg | EXTENDED_RELEASE_TABLET | Freq: Two times a day (BID) | ORAL | Status: DC
Start: 2021-09-23 — End: 2021-09-25
  Administered 2021-09-23 – 2021-09-24 (×4): 10 mg via ORAL
  Filled 2021-09-23 (×4): qty 1

## 2021-09-23 MED ORDER — HYDROCODONE-ACETAMINOPHEN 5-325 MG PO TABS
1.0000 | ORAL_TABLET | ORAL | Status: DC | PRN
  Administered 2021-09-23 – 2021-09-24 (×3): 1 via ORAL
  Filled 2021-09-23 (×3): qty 1

## 2021-09-23 MED ORDER — CHLORHEXIDINE GLUCONATE CLOTH 2 % EX PADS
6.0000 | MEDICATED_PAD | Freq: Every day | CUTANEOUS | Status: DC
  Administered 2021-09-23 – 2021-10-01 (×9): 6 via TOPICAL

## 2021-09-23 MED ORDER — DEXTROSE-NACL 5-0.9 % IV SOLN: INTRAVENOUS | Status: DC

## 2021-09-23 MED ORDER — VANCOMYCIN HCL IN DEXTROSE 1-5 GM/200ML-% IV SOLN: 1000.0000 mg | Freq: Two times a day (BID) | INTRAVENOUS | Status: DC

## 2021-09-23 MED ORDER — FAMOTIDINE 20 MG PO TABS
20.0000 mg | ORAL_TABLET | Freq: Every day | ORAL | Status: DC
  Administered 2021-09-23 – 2021-10-01 (×9): 20 mg via ORAL
  Filled 2021-09-23 (×9): qty 1

## 2021-09-23 MED ORDER — MORPHINE SULFATE (PF) 2 MG/ML IV SOLN
1.0000 mg | Freq: Once | INTRAVENOUS | Status: AC | PRN
  Administered 2021-09-23: 1 mg via INTRAVENOUS
  Filled 2021-09-23: qty 1

## 2021-09-23 MED ORDER — GADOBUTROL 1 MMOL/ML IV SOLN
9.0000 mL | Freq: Once | INTRAVENOUS | Status: AC | PRN
  Administered 2021-09-23: 9 mL via INTRAVENOUS

## 2021-09-23 MED ORDER — METHOCARBAMOL 500 MG PO TABS
500.0000 mg | ORAL_TABLET | Freq: Three times a day (TID) | ORAL | Status: DC | PRN
  Administered 2021-09-23 – 2021-10-01 (×14): 500 mg via ORAL
  Filled 2021-09-23 (×15): qty 1

## 2021-09-23 MED ORDER — METOPROLOL TARTRATE 12.5 MG HALF TABLET
12.5000 mg | ORAL_TABLET | Freq: Two times a day (BID) | ORAL | Status: DC
  Administered 2021-09-23 – 2021-09-25 (×6): 12.5 mg via ORAL
  Filled 2021-09-23 (×7): qty 1

## 2021-09-23 MED ORDER — LORAZEPAM 1 MG PO TABS
1.0000 mg | ORAL_TABLET | Freq: Once | ORAL | Status: AC
  Administered 2021-09-23: 1 mg via ORAL
  Filled 2021-09-23: qty 1

## 2021-09-23 MED ORDER — ACETAMINOPHEN 325 MG PO TABS
650.0000 mg | ORAL_TABLET | Freq: Four times a day (QID) | ORAL | Status: DC | PRN
  Administered 2021-09-24 – 2021-09-27 (×4): 650 mg via ORAL
  Filled 2021-09-23 (×5): qty 2

## 2021-09-23 MED ORDER — VANCOMYCIN HCL 1500 MG/300ML IV SOLN
1500.0000 mg | Freq: Once | INTRAVENOUS | Status: AC
  Administered 2021-09-23: 1500 mg via INTRAVENOUS
  Filled 2021-09-23: qty 300

## 2021-09-23 MED ORDER — DOCUSATE SODIUM 100 MG PO CAPS
100.0000 mg | ORAL_CAPSULE | Freq: Two times a day (BID) | ORAL | Status: DC
  Administered 2021-09-23 – 2021-09-27 (×9): 100 mg via ORAL
  Filled 2021-09-23 (×11): qty 1

## 2021-09-23 MED ORDER — SODIUM CHLORIDE 0.9 % IV SOLN
12.0000 g | INTRAVENOUS | Status: DC
  Administered 2021-09-23 – 2021-09-26 (×4): 12 g via INTRAVENOUS
  Filled 2021-09-23 (×5): qty 12000

## 2021-09-23 MED ORDER — SODIUM CHLORIDE 0.9 % IV SOLN: INTRAVENOUS | Status: DC

## 2021-09-23 MED ORDER — VANCOMYCIN HCL 750 MG/150ML IV SOLN
750.0000 mg | Freq: Two times a day (BID) | INTRAVENOUS | Status: DC
  Filled 2021-09-23: qty 150

## 2021-09-23 MED ORDER — MORPHINE SULFATE (PF) 2 MG/ML IV SOLN
1.0000 mg | INTRAVENOUS | Status: DC | PRN
  Administered 2021-09-23 – 2021-09-24 (×6): 1 mg via INTRAVENOUS
  Filled 2021-09-23 (×6): qty 1

## 2021-09-23 MED ORDER — ACETAMINOPHEN 650 MG RE SUPP: 650.0000 mg | Freq: Four times a day (QID) | RECTAL | Status: DC | PRN

## 2021-09-23 MED ORDER — ATORVASTATIN CALCIUM 10 MG PO TABS
20.0000 mg | ORAL_TABLET | Freq: Every day | ORAL | Status: DC
  Administered 2021-09-23 – 2021-10-01 (×9): 20 mg via ORAL
  Filled 2021-09-23 (×9): qty 2

## 2021-09-23 NOTE — Consult Note (Addendum)
Reason for Consult: RLE weakness/pain Referring Physician: Gailen Shelter, PA    HPI: Michael Hill is a 70 y.o. male who presented to the ED last night with increasing lumbosacral pain that radiated into his RLE stopping in the plantar surface of his right foot. He was taken to the OR on 08/25/21 for L5-S1 decompression/fusion/exploration of adjacent level fusion with extension to pelvis. He was then readmitted 10/5-10/12 for infection of his lumbar wound. During his 2nd admission, he also had acute hypoxic respiratory failure secondary to COVID-19 infection. He ultimately underwent I&D on 10/5 and was found to have severe sepsis due to MSSA bacteremia.  TEE was negative for vegetation.  PICC line inserted and he was discharged on IV cefazolin and oral rifampin per ID recommendations.   He was doing well at home until last weekend when he began to have a reoccurrence of his symptoms with intermittent wound drainage. He notified our office yesterday of his situation and stated that his pain was 10/10 and was unable to get out of bed. He reports taking all of his postoperative medications and antibiotics as directed. He was instructed to call EMS. While in the ED, he was slightly tachycardic with soft BPs. He has remained afebrile while in the ED, however, wife reports fevers of around 38 degrees Celsius over the last few days. He denies any numbness or tingling and bowel and bladder function is intact.  Past Medical History:  Diagnosis Date   Arthritis    Chronic back pain    scoliosis/stenosis/spondylosis   History of bronchitis    many yrs ago   History of shingles    Hyperlipidemia    takes Lipitor daily   Hypertension    takes Lotrel daily    Past Surgical History:  Procedure Laterality Date   ABDOMINAL EXPOSURE N/A 08/05/2014   Procedure: ABDOMINAL EXPOSURE;  Surgeon: Chuck Hint, MD;  Location: [facility];  Service: Vascular;  Laterality: N/A;   ANTERIOR LAT LUMBAR FUSION  Right 08/05/2014   Procedure: Right Lumbar two-three,Lumbar three-four,Lumbar four-five Anterior lateral lumbar interbody fusion;  Surgeon: Maeola Harman, MD;  Location: [facility];  Service: Neurosurgery;  Laterality: Right;  right    ANTERIOR LUMBAR FUSION N/A 08/05/2014   Procedure: Lumbar five-Sacral-One Anterior lumbar interbody fusion with Dr. Edilia Bo for approach; Right Lumbar two-three,Lumbar three-four,Lumbar four-five Anterior lateral lumbar interbody fusion;  Surgeon: Maeola Harman, MD;  Location: [facility];  Service: Neurosurgery;  Laterality: N/A;   APPLICATION OF INTRAOPERATIVE CT SCAN N/A 08/24/2021   Procedure: APPLICATION OF INTRAOPERATIVE CT SCAN;  Surgeon: Maeola Harman, MD;  Location: [facility] OR;  Service: Neurosurgery;  Laterality: N/A;   COLONOSCOPY     EPIDURAL BLOCK INJECTION     KNEE ARTHROSCOPY Bilateral    x 4    LUMBAR FUSION  08/05/2014        DR STERN   LUMBAR PERCUTANEOUS PEDICLE SCREW 3 LEVEL N/A 08/07/2014   Procedure: Stage II Lumbar percutaneous pedicle screw placement L2-S1;  Surgeon: Maeola Harman, MD;  Location: [facility];  Service: Neurosurgery;  Laterality: N/A;  Stage II Lumbar percutaneous pedicle screw placement L2-S1   LUMBAR WOUND DEBRIDEMENT N/A 09/08/2021   Procedure: Incision and drainage of lumbar wound;  Surgeon: Maeola Harman, MD;  Location: [facility] OR;  Service: Neurosurgery;  Laterality: N/A;   PROSTATE BIOPSY     ROTATOR CUFF REPAIR Right    TEE WITHOUT CARDIOVERSION N/A 09/14/2021   Procedure: TRANSESOPHAGEAL ECHOCARDIOGRAM (TEE);  Surgeon: Lewayne Bunting, MD;  Location: [facility] ENDOSCOPY;  Service: Cardiovascular;  Laterality: N/A;    Family History  Problem Relation Age of Onset   Heart disease Father        before age 49   Hyperlipidemia Father    Hypertension Father    Hypertension Brother    Heart disease Brother        before age 65   Cancer Daughter     Social History:  reports that he has never smoked. He has never used smokeless  tobacco. He reports that he does not drink alcohol and does not use drugs.  Allergies: No Known Allergies  Medications: I have reviewed the patient's current medications.  Results for orders placed or performed during the hospital encounter of 09/22/21 (from the past 48 hour(s))  CBC with Differential/Platelet     Status: Abnormal   Collection Time: 09/22/21  8:45 PM  Result Value Ref Range   WBC 8.7 4.0 - 10.5 K/uL   RBC 3.44 (L) 4.22 - 5.81 MIL/uL   Hemoglobin 9.8 (L) 13.0 - 17.0 g/dL   HCT 27.0 (L) 35.0 - 09.3 %   MCV 89.8 80.0 - 100.0 fL   MCH 28.5 26.0 - 34.0 pg   MCHC 31.7 30.0 - 36.0 g/dL   RDW 81.8 29.9 - 37.1 %   Platelets 314 150 - 400 K/uL   nRBC 0.0 0.0 - 0.2 %   Neutrophils Relative % 67 %   Neutro Abs 5.9 1.7 - 7.7 K/uL   Lymphocytes Relative 22 %   Lymphs Abs 1.9 0.7 - 4.0 K/uL   Monocytes Relative 10 %   Monocytes Absolute 0.8 0.1 - 1.0 K/uL   Eosinophils Relative 0 %   Eosinophils Absolute 0.0 0.0 - 0.5 K/uL   Basophils Relative 1 %   Basophils Absolute 0.1 0.0 - 0.1 K/uL   Immature Granulocytes 0 %   Abs Immature Granulocytes 0.03 0.00 - 0.07 K/uL    Comment: Performed at [facility], [address] [place], [place] [address]  Comprehensive metabolic panel     Status: Abnormal   Collection Time: 09/22/21  8:45 PM  Result Value Ref Range   Sodium 129 (L) 135 - 145 mmol/L   Potassium 4.6 3.5 - 5.1 mmol/L   Chloride 92 (L) 98 - 111 mmol/L   CO2 27 22 - 32 mmol/L   Glucose, Bld 114 (H) 70 - 99 mg/dL    Comment: Glucose reference range applies only to samples taken after fasting for at least 8 hours.   BUN 9 8 - 23 mg/dL   Creatinine, Ser 9.38 0.61 - 1.24 mg/dL   Calcium 8.7 (L) 8.9 - 10.3 mg/dL   Total Protein 6.2 (L) 6.5 - 8.1 g/dL   Albumin 2.0 (L) 3.5 - 5.0 g/dL   AST 24 15 - 41 U/L   ALT 7 0 - 44 U/L   Alkaline Phosphatase 66 38 - 126 U/L   Total Bilirubin 0.6 0.3 - 1.2 mg/dL   GFR, Estimated >10 >17 mL/min    Comment: (NOTE) Calculated  using the CKD-EPI Creatinine Equation (2021)    Anion gap 10 5 - 15    Comment: Performed at [facility], [address], [place], [place] [address]  Sedimentation rate     Status: Abnormal   Collection Time: 09/23/21 12:53 AM  Result Value Ref Range   Sed Rate 126 (H) 0 - 16 mm/hr  Comment: Performed at [facility], [address] [place], [place] [address]  C-reactive protein     Status: Abnormal   Collection Time: 09/23/21 12:53 AM  Result Value Ref Range   CRP 21.4 (H) <1.0 mg/dL    Comment: Performed at [facility], [address] [place], [place] [address]  Urinalysis, Routine w reflex microscopic Urine, Clean Catch     Status: Abnormal   Collection Time: 09/23/21  1:00 AM  Result Value Ref Range   Color, Urine AMBER (A) YELLOW    Comment: BIOCHEMICALS MAY BE AFFECTED BY COLOR   APPearance CLEAR CLEAR   Specific Gravity, Urine 1.015 1.005 - 1.030   pH 6.0 5.0 - 8.0   Glucose, UA NEGATIVE NEGATIVE mg/dL   Hgb urine dipstick NEGATIVE NEGATIVE   Bilirubin Urine NEGATIVE NEGATIVE   Ketones, ur NEGATIVE NEGATIVE mg/dL   Protein, ur NEGATIVE NEGATIVE mg/dL   Nitrite NEGATIVE NEGATIVE   Leukocytes,Ua NEGATIVE NEGATIVE    Comment: Performed at [facility], [address], [place], [place] [address]  Type and screen [facility]     Status: None   Collection Time: 09/23/21  6:05 AM  Result Value Ref Range   ABO/RH(D) A POS    Antibody Screen NEG    Sample Expiration      09/26/2021,2359 Performed at [facility], [address], [place], [place] [address]   CBC     Status: Abnormal   Collection Time: 09/23/21  6:05 AM  Result Value Ref Range   WBC 9.2 4.0 - 10.5 K/uL   RBC 3.36 (L) 4.22 - 5.81 MIL/uL   Hemoglobin 9.7 (L) 13.0 - 17.0 g/dL   HCT 35.3 (L) 61.4 - 43.1 %   MCV 92.0 80.0 - 100.0 fL   MCH 28.9 26.0 - 34.0 pg   MCHC 31.4 30.0 - 36.0 g/dL   RDW 54.0 08.6 - 76.1 %   Platelets 352 150 - 400 K/uL   nRBC 0.0 0.0  - 0.2 %    Comment: Performed at [facility], [address], [place], [place] [address]  Osmolality     Status: Abnormal   Collection Time: 09/23/21  6:05 AM  Result Value Ref Range   Osmolality 270 (L) 275 - 295 mOsm/kg    Comment: Performed at [facility], [address], [place], [place] [address]  Basic metabolic panel     Status: Abnormal   Collection Time: 09/23/21  6:05 AM  Result Value Ref Range   Sodium 130 (L) 135 - 145 mmol/L   Potassium 4.6 3.5 - 5.1 mmol/L   Chloride 94 (L) 98 - 111 mmol/L   CO2 24 22 - 32 mmol/L   Glucose, Bld 109 (H) 70 - 99 mg/dL    Comment: Glucose reference range applies only to samples taken after fasting for at least 8 hours.   BUN 10 8 - 23 mg/dL   Creatinine, Ser 4.58 0.61 - 1.24 mg/dL   Calcium 8.6 (L) 8.9 - 10.3 mg/dL   GFR, Estimated >09 >98 mL/min    Comment: (NOTE) Calculated using the CKD-EPI Creatinine Equation (2021)    Anion gap 12 5 - 15    Comment: Performed at [facility], [address] [place], [place] [address]    CT LUMBAR SPINE W CONTRAST  Result Date: 09/22/2021 CLINICAL DATA:  Low back pain, infection suspected, history of low back pain  and abscess after laminectomy. EXAM: CT LUMBAR SPINE WITH CONTRAST TECHNIQUE: Multidetector CT imaging of the lumbar spine was performed with intravenous contrast administration. CONTRAST:  44mL OMNIPAQUE IOHEXOL 350 MG/ML SOLN COMPARISON:  09/08/2021 FINDINGS: Segmentation: 5 lumbar type vertebrae. Alignment: Levocurvature of the lumbar spine. Unchanged mild retrolisthesis L1 on L2. Vertebrae: No acute fracture. Status post L2-bi iliac fusion with ALIF at L5-S1, with no evidence of paraspinous lucency or hardware fracture. Paraspinal and other soft tissues: Peripherally enhancing fluid collection with foci of air in the subcutaneous soft tissues posterior to the lumbar spine, extending from the level of L2 to S3, measuring up to 7.9 x 5.7 x 15.6 cm (AP x TR x CC) (series 5,  image 99 and series 9, image 54), previously 3.7 x 4.7 x 17.6 cm. The collection now extends into the paraspinous musculature and up to the spinal hardware from L4 through S2, most prominently at L5, where air and fluid are seen between the spinous process and left spinal rod. Additional subcutaneous edema extends from the thoracolumbar junction to the inferior sacrum. Focal skin defect posterior to L2 (series 5, image 52). Disc levels: Redemonstrated disc height loss and vacuum disc phenomenon at T11-T12, T12-L1, and L1-L2, without significant spinal canal stenosis. IMPRESSION: Interval increase in the size and complexity of a fluid collection in the soft tissues posterior to the lumbosacral operative bed, with peripheral enhancement and internal foci of air, concerning for abscess with gas-forming organisms versus air entering from an open skin wound posterior to L2. The collection extends into the paraspinous musculature from L4 through S2, and, at the level of L5, it extends anteriorly to the space between the left spinal rod and spinous process. Electronically Signed   By: Wiliam Ke M.D.   On: 09/22/2021 23:56   MR LUMBAR SPINE WO CONTRAST  Result Date: 09/23/2021 CLINICAL DATA:  Low back pain, infection suspected EXAM: MRI LUMBAR SPINE WITHOUT CONTRAST TECHNIQUE: Multiplanar, multisequence MR imaging of the lumbar spine was performed. No intravenous contrast was administered. COMPARISON:  05/08/2014 lumbar spine MRI, correlation is also made with same day CT L-spine FINDINGS: Extremely limited, as the patient was not able to tolerate the exam. Only scouts and a sagittal T2 sequence was obtained, which is degraded by motion artifact. The fluid collection noted on the prior CT is actually better evaluated on the scout (series 7, images 5-12), where it measures up to 7.1 cm in AP dimension and 17 cm in craniocaudal dimension (series 7, image 10). On the axial scout images (series 8) the fluid collection  can be seen extending through a laminectomy defect at the level of L5-S1, and approaching the posterior aspect of the thecal sac (series 8, image 3). The fluid collection can also be seen extending to the skin surface on this image. IMPRESSION: Limited exam due to the sequences obtained. Within this limitation, there is a large fluid collection in the soft tissues of the back, which likely extends to the posterior aspect of the thecal sac at the L5-S1 level. Electronically Signed   By: Wiliam Ke M.D.   On: 09/23/2021 03:04    ROS: Per HPI Blood pressure (!) 98/55, pulse (!) 116, temperature 98.4 F (36.9 C), temperature source Oral, resp. rate (!) 21, height 5\' 7"  (1.702 m), weight 90.7 kg, SpO2 93 %.  Physical Exam: Patient is mildly lethargic, easily arousable to voice, O X 4, and conversant. Speech is fluent and appropriate. MAEW. RLE 4/5, LLE 5/5. Sensation to light  touch is intact. PERLA, EOMI. CNs grossly intact. Dressing with serous drainage that appears to be coming from the superior portion of the wound. Minimal drainage was expressed on exam. Moderate peri wound erythema.    Assessment/Plan: 70 y.o. male who is s/p L5-S1 decompression/fusion/exploration of adjacent level fusion with extension to pelvis on 08/25/21. He was initially recovering well and then had a reoccurrence of his preoperative symptoms. He was readmitted on 09/08/2021 and was found to have a lumbar wound infection and severe sepsis due to MSSA bacteremia. He underwent an I&D of his lumbar wound on 09/08/2021. He was discharged on 10/12 with PICC and abx per ID. He was doing well until last weekend when he began to have a reoccurrence oh his lumbosacral pain and RLE radiculopathy with intermittent wound drainage. His symptoms continued to worsen and sought evaluation at the ED. CT lumbar spine showing moderate fluid collection. MRI was unable to be completed and was so motion degraded that it was not diagnostic.   - Blood and  wound cultures pending.  -Will do lumbar MRI with and without contrast. Utilize PRN pain medication prior to MRI. If unable to tolerate MRI, will consider sedation.  -BPs running soft. Will begin IVF. NS 100 ml/hr -Resume home pain medications - oxycodone 10 mg BID, Norco 5/325 mg Q4H, Robaxin 500 mg Q6H -IV morphine for breakthrough pain and MRI -Plan for possible surgical intervention tomorrow. Awaiting MRI results for definitive plan. Will make NPO at midnight tonight.    Council [employer]ic, DNP, NP-C 09/23/2021, 10:35 AM    Addendum:  Patient seen and examined.  Agree with above.  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  We will attempt to repeat MRI with and without contrast. May need wound washout pending findings. Inflammatory markers have increased, though he is afebrile and does not have leukocytosis at this time Continues to have wound drainage therefore likely will need wound washout and wound VAC placement

## 2021-09-23 NOTE — H&P (Addendum)
History and Physical    Michael Hill XOV:[id number] DOB: 12/18/1950 DOA: 09/22/2021  PCP: Antony Contras, MD Patient coming from: Home  Chief Complaint: Back pain  HPI: Michael Hill is a 70 y.o. 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  Admitted again 10/5-10/12 for infection of recent surgical site status post I&D on 10/5 and acute hypoxic respiratory failure secondary to COVID infection.  Hospital course complicated by severe sepsis due to MSSA bacteremia.  TEE negative for vegetation.  PICC line inserted and discharged on IV cefazolin and oral oral rifampin per ID recommendations.  He now presents to the ED for evaluation of worsening back pain since recent surgery and right leg weakness.  Slightly tachycardic in the ED but afebrile.  Labs notable for WBC 8.7, hemoglobin 9.8 (was 11.4 on 09/13/2021), platelet count 314k.  Sodium 129, potassium 4.6, chloride 92, bicarb 27, BUN 9, creatinine 0.8, glucose 114.  ESR 126.  CRP 21.4.  UA without signs of infection.  Blood culture x2 pending.  CT lumbar spine showing abscess with gas-forming organisms versus air entering from an open wound posterior to L2.  The collection extends into the paraspinous musculature from L4-S2 and at the level of L5 it extends anteriorly to the space between the left spinal rod and spinous process.  MRI (limited exam) showing a large fluid collection in the soft tissues of the back which likely extends to the posterior aspect of the thecal sac at the L5-S1 level.  Neurosurgery consulted. Patient was given Tylenol, Toradol, Ativan, morphine, vancomycin, cefepime, and 1 L normal saline bolus.  History provided by patient and his wife at bedside.  He has experienced  ongoing back pain since after his surgery but it has been significantly worse for the past 2 days.  He has not been able to get up and walk because every time he tries to get up he experiences severe shooting back pain which radiates down both of his legs.  Denies numbness or tingling of lower extremities.  Denies saddle anesthesia or bowel/bladder dysfunction.  His surgical wound site has been draining for the past few days.  Having low-grade fevers.  Denies cough, shortness of breath, chest pain, vomiting, abdominal pain, or diarrhea.  Review of Systems:  All systems reviewed and apart from history of presenting illness, are negative.  Past Medical History:  Diagnosis Date   Arthritis    Chronic back pain    scoliosis/stenosis/spondylosis   History of bronchitis    many yrs ago   History of shingles    Hyperlipidemia    takes Lipitor daily   Hypertension    takes Lotrel daily    Past Surgical History:  Procedure Laterality Date   ABDOMINAL EXPOSURE N/A 08/05/2014   Procedure: ABDOMINAL EXPOSURE;  Surgeon: Angelia Mould, MD;  Location: [place] [facility];  Service: Vascular;  Laterality: N/A;   ANTERIOR LAT LUMBAR FUSION Right 08/05/2014   Procedure: Right Lumbar two-three,Lumbar three-four,Lumbar four-five Anterior lateral lumbar interbody fusion;  Surgeon: Erline Levine, MD;  Location: [place] [facility];  Service: Neurosurgery;  Laterality: Right;  right    ANTERIOR LUMBAR FUSION N/A 08/05/2014   Procedure: Lumbar five-Sacral-One Anterior lumbar interbody fusion with Dr. Scot Dock for approach; Right Lumbar two-three,Lumbar three-four,Lumbar four-five Anterior lateral lumbar interbody fusion;  Surgeon: Erline Levine, MD;  Location: [place] [facility];  Service: Neurosurgery;  Laterality: N/A;   APPLICATION OF INTRAOPERATIVE CT SCAN N/A 08/24/2021   Procedure: APPLICATION OF INTRAOPERATIVE CT SCAN;  Surgeon: Erline Levine, MD;  Location: [place];  Service: Neurosurgery;  Laterality: N/A;   COLONOSCOPY      EPIDURAL BLOCK INJECTION     KNEE ARTHROSCOPY Bilateral    x 4    LUMBAR FUSION  08/05/2014        DR STERN   LUMBAR PERCUTANEOUS PEDICLE SCREW 3 LEVEL N/A 08/07/2014   Procedure: Stage II Lumbar percutaneous pedicle screw placement L2-S1;  Surgeon: Erline Levine, MD;  Location: [place] [facility];  Service: Neurosurgery;  Laterality: N/A;  Stage II Lumbar percutaneous pedicle screw placement L2-S1   LUMBAR WOUND DEBRIDEMENT N/A 09/08/2021   Procedure: Incision and drainage of lumbar wound;  Surgeon: Erline Levine, MD;  Location: [place];  Service: Neurosurgery;  Laterality: N/A;   PROSTATE BIOPSY     ROTATOR CUFF REPAIR Right    TEE WITHOUT CARDIOVERSION N/A 09/14/2021   Procedure: TRANSESOPHAGEAL ECHOCARDIOGRAM (TEE);  Surgeon: Lelon Perla, MD;  Location: [facility];  Service: Cardiovascular;  Laterality: N/A;     reports that he has never smoked. He has never used smokeless tobacco. He reports that he does not drink alcohol and does not use drugs.  No Known Allergies  Family History  Problem Relation Age of Onset   Heart disease Father        before age 78   Hyperlipidemia Father    Hypertension Father    Hypertension Brother    Heart disease Brother        before age 83   Cancer Daughter     Prior to Admission medications   Medication Sig Start Date End Date Taking? Authorizing Provider  acetaminophen (TYLENOL) 325 MG tablet Take 325 mg by mouth every 6 (six) hours as needed for moderate pain or headache.    [provider]  ascorbic acid (VITAMIN C) 500 MG tablet Take 1 tablet (500 mg total) by mouth daily. 09/15/21   Lavina Hamman, MD  atorvastatin (LIPITOR) 20 MG tablet Take 20 mg by mouth daily.    [provider]  ceFAZolin (ANCEF) IVPB Inject 2 g into the vein every 8 (eight) hours. Indication:  MSSA Bacteremia/Lumbar Infection First Dose: Yes Last Day of Therapy:  10/20/21 Labs - Once weekly:  CBC/D and BMP, Labs - Every other week:  ESR and  CRP Method of administration: IV Push Method of administration may be changed at the discretion of home infusion pharmacist based upon assessment of the patient and/or caregiver's ability to self-administer the medication ordered. 09/13/21 10/20/21  Lavina Hamman, MD  docusate sodium (COLACE) 100 MG capsule Take 1 capsule (100 mg total) by mouth 2 (two) times daily. 09/14/21   Lavina Hamman, MD  famotidine (PEPCID) 20 MG tablet Take 1 tablet (20 mg total) by mouth daily. 09/15/21   Lavina Hamman, MD  methocarbamol (ROBAXIN) 500 MG tablet Take 1 tablet (500 mg total) by mouth every 6 (six) hours as needed for muscle spasms. 08/25/21   Marvis Moeller, NP  metoprolol succinate (TOPROL XL) 50 MG 24 hr tablet Take 1 tablet (50 mg total) by mouth daily. Take with or immediately following a meal. 09/15/21 09/15/22  Lavina Hamman, MD  oxyCODONE (OXY IR/ROXICODONE) 5 MG immediate release tablet Take 5 mg by mouth 3 (three) times daily as needed for moderate pain.  [provider]  oxyCODONE (OXYCONTIN) 10 mg 12 hr tablet Take 10 mg by mouth every 12 (twelve) hours.    [provider]  pantoprazole (PROTONIX) 40 MG tablet Take 1 tablet (40 mg total) by mouth daily. 09/15/21   Lavina Hamman, MD  rifampin (RIFADIN) 300 MG capsule Take 1 capsule (300 mg total) by mouth 2 (two) times daily. 09/13/21 12/12/21  Lavina Hamman, MD  zinc sulfate 220 (50 Zn) MG capsule Take 1 capsule (220 mg total) by mouth daily. 09/15/21   Lavina Hamman, MD    Physical Exam: Vitals:   09/22/21 2300 09/22/21 2345 09/23/21 0000 09/23/21 0335  BP: 120/64 105/65 107/60 (!) 147/87  Pulse: (!) 112  (!) 107 (!) 108  Resp: 12 (!) 21 (!) 23 18  Temp:      TempSrc:      SpO2: 96%   99%  Weight:      Height:        Physical Exam Constitutional:      Comments: Sitting up at the side of the bed holding a walker Appears uncomfortable secondary to pain  HENT:     Head: Normocephalic and atraumatic.   Eyes:     Extraocular Movements: Extraocular movements intact.     Conjunctiva/sclera: Conjunctivae normal.  Cardiovascular:     Rate and Rhythm: Normal rate and regular rhythm.     Pulses: Normal pulses.  Pulmonary:     Effort: Pulmonary effort is normal. No respiratory distress.     Breath sounds: Normal breath sounds. No wheezing or rales.  Abdominal:     General: Bowel sounds are normal. There is no distension.     Palpations: Abdomen is soft.     Tenderness: There is no abdominal tenderness.  Musculoskeletal:        General: No tenderness.     Cervical back: Normal range of motion and neck supple.     Comments: Bilateral pedal edema Surgical wound site: Serous drainage, sutures intact  Skin:    General: Skin is warm and dry.  Neurological:     Mental Status: He is alert and oriented to person, place, and time.     Comments: Strength 5 out of 5 in the left lower extremity and 4 out of 5 in the right lower extremity.  No sensory deficit.     Labs on Admission: I have personally reviewed following labs and imaging studies  CBC: Recent Labs  Lab 09/22/21 2045  WBC 8.7  NEUTROABS 5.9  HGB 9.8*  HCT 30.9*  MCV 89.8  PLT 034   Basic Metabolic Panel: Recent Labs  Lab 09/22/21 2045  NA 129*  K 4.6  CL 92*  CO2 27  GLUCOSE 114*  BUN 9  CREATININE 0.80  CALCIUM 8.7*   GFR: Estimated Creatinine Clearance: 93.6 mL/min (by C-G formula based on SCr of 0.8 mg/dL). Liver Function Tests: Recent Labs  Lab 09/22/21 2045  AST 24  ALT 7  ALKPHOS 66  BILITOT 0.6  PROT 6.2*  ALBUMIN 2.0*   No results for input(s): LIPASE, AMYLASE in the last 168 hours. No results for input(s): AMMONIA in the last 168 hours. Coagulation Profile: No results for input(s): INR, PROTIME in the last 168 hours. Cardiac Enzymes: No results for input(s): CKTOTAL, CKMB, CKMBINDEX, TROPONINI in the last 168 hours. BNP (last 3 results) No results for input(s): PROBNP in the last 8760  hours. HbA1C: No results for input(s): HGBA1C in the last 72  hours. CBG: No results for input(s): GLUCAP in the last 168 hours. Lipid Profile: No results for input(s): CHOL, HDL, LDLCALC, TRIG, CHOLHDL, LDLDIRECT in the last 72 hours. Thyroid Function Tests: No results for input(s): TSH, T4TOTAL, FREET4, T3FREE, THYROIDAB in the last 72 hours. Anemia Panel: No results for input(s): VITAMINB12, FOLATE, FERRITIN, TIBC, IRON, RETICCTPCT in the last 72 hours. Urine analysis:    Component Value Date/Time   COLORURINE AMBER (A) 09/23/2021 0100   APPEARANCEUR CLEAR 09/23/2021 0100   LABSPEC 1.015 09/23/2021 0100   PHURINE 6.0 09/23/2021 0100   GLUCOSEU NEGATIVE 09/23/2021 0100   HGBUR NEGATIVE 09/23/2021 0100   BILIRUBINUR NEGATIVE 09/23/2021 0100   KETONESUR NEGATIVE 09/23/2021 0100   PROTEINUR NEGATIVE 09/23/2021 0100   NITRITE NEGATIVE 09/23/2021 0100   LEUKOCYTESUR NEGATIVE 09/23/2021 0100    Radiological Exams on Admission: CT LUMBAR SPINE W CONTRAST  Result Date: 09/22/2021 CLINICAL DATA:  Low back pain, infection suspected, history of low back pain and abscess after laminectomy. EXAM: CT LUMBAR SPINE WITH CONTRAST TECHNIQUE: Multidetector CT imaging of the lumbar spine was performed with intravenous contrast administration. CONTRAST:  70m OMNIPAQUE IOHEXOL 350 MG/ML SOLN COMPARISON:  09/08/2021 FINDINGS: Segmentation: 5 lumbar type vertebrae. Alignment: Levocurvature of the lumbar spine. Unchanged mild retrolisthesis L1 on L2. Vertebrae: No acute fracture. Status post L2-bi iliac fusion with ALIF at L5-S1, with no evidence of paraspinous lucency or hardware fracture. Paraspinal and other soft tissues: Peripherally enhancing fluid collection with foci of air in the subcutaneous soft tissues posterior to the lumbar spine, extending from the level of L2 to S3, measuring up to 7.9 x 5.7 x 15.6 cm (AP x TR x CC) (series 5, image 99 and series 9, image 54), previously 3.7 x 4.7 x 17.6 cm.  The collection now extends into the paraspinous musculature and up to the spinal hardware from L4 through S2, most prominently at L5, where air and fluid are seen between the spinous process and left spinal rod. Additional subcutaneous edema extends from the thoracolumbar junction to the inferior sacrum. Focal skin defect posterior to L2 (series 5, image 52). Disc levels: Redemonstrated disc height loss and vacuum disc phenomenon at T11-T12, T12-L1, and L1-L2, without significant spinal canal stenosis. IMPRESSION: Interval increase in the size and complexity of a fluid collection in the soft tissues posterior to the lumbosacral operative bed, with peripheral enhancement and internal foci of air, concerning for abscess with gas-forming organisms versus air entering from an open skin wound posterior to L2. The collection extends into the paraspinous musculature from L4 through S2, and, at the level of L5, it extends anteriorly to the space between the left spinal rod and spinous process. Electronically Signed   By: AMerilyn BabaM.D.   On: 09/22/2021 23:56   MR LUMBAR SPINE WO CONTRAST  Result Date: 09/23/2021 CLINICAL DATA:  Low back pain, infection suspected EXAM: MRI LUMBAR SPINE WITHOUT CONTRAST TECHNIQUE: Multiplanar, multisequence MR imaging of the lumbar spine was performed. No intravenous contrast was administered. COMPARISON:  05/08/2014 lumbar spine MRI, correlation is also made with same day CT L-spine FINDINGS: Extremely limited, as the patient was not able to tolerate the exam. Only scouts and a sagittal T2 sequence was obtained, which is degraded by motion artifact. The fluid collection noted on the prior CT is actually better evaluated on the scout (series 7, images 5-12), where it measures up to 7.1 cm in AP dimension and 17 cm in craniocaudal dimension (series 7, image 10). On the axial  scout images (series 8) the fluid collection can be seen extending through a laminectomy defect at the level of  L5-S1, and approaching the posterior aspect of the thecal sac (series 8, image 3). The fluid collection can also be seen extending to the skin surface on this image. IMPRESSION: Limited exam due to the sequences obtained. Within this limitation, there is a large fluid collection in the soft tissues of the back, which likely extends to the posterior aspect of the thecal sac at the L5-S1 level. Electronically Signed   By: Merilyn Baba M.D.   On: 09/23/2021 03:04    EKG: Ordered and pending at this time.  Assessment/Plan Principal Problem:   Postoperative wound infection Active Problems:   HYPERCHOLESTEROLEMIA   Hyponatremia   Anemia   Postop wound infection Patient underwent L5-S1 decompression/fusion/exploration of adjacent level fusion with extension to pelvis on 08/25/2021. Admitted again 10/5-10/12 for infection of recent surgical site status post I&D on 10/5.  Discharged on cefazolin and rifampin due to MSSA bacteremia.  Presents with worsening back pain and right leg weakness. CT lumbar spine showing abscess with gas-forming organisms versus air entering from an open wound posterior to L2.  The collection extends into the paraspinous musculature from L4-S2 and at the level of L5 it extends anteriorly to the space between the left spinal rod and spinous process.  MRI (limited exam) showing a large fluid collection in the soft tissues of the back which likely extends to the posterior aspect of the thecal sac at the L5-S1 level.  ESR and CRP elevated.  No fever or leukocytosis.  Has slight weakness of the right lower extremity but no sensory deficit.  Patient is not endorsing saddle anesthesia or bowel/bladder dysfunction. -Neurosurgery consulted.  Continue vancomycin and cefepime.  Pain management.  Surgical wound site is draining and culture obtained.  Blood culture x2 pending.  Hyponatremia Sodium 129, was 135 ten days ago.  Patient was given 1 L normal saline bolus in the ED. -Repeat BMP,  check serum osmolarity  Acute on chronic anemia In the setting of recent surgeries.  Hemoglobin 9.8, was 11.4 ten days ago.  Patient is not endorsing any symptoms of GI bleed. -Type and screen, continue to monitor H&H  Hypertension: Stable. Hyperlipidemia -Pharmacy med rec pending.  DVT prophylaxis: SCDs Code Status: Full code-discussed with the patient. Family Communication: Wife at bedside. Disposition Plan: Status is: Inpatient  Remains inpatient appropriate because: Spinal abscess which will need surgical intervention.  Level of care: Level of care: Telemetry Medical  The medical decision making on this patient was of high complexity and the patient is at high risk for clinical deterioration, therefore this is a level 3 visit.  Shela Leff MD [facility]  If 7PM-7AM, please contact night-coverage [email or web address]  09/23/2021, 6:09 AM

## 2021-09-23 NOTE — Progress Notes (Addendum)
Pharmacy Antibiotic Note  Michael Hill is a 70 y.o. male s/p lumbar fusion 9/202 and recent MSSA bacteremia on Ancef and rifampin admitted on 09/22/2021 with back pain and surgical site infection .  Pharmacy has been consulted for Vancomycin and Cefepime  dosing.  Vancomycin 1500 mg IV given in ED at  0330  Plan: Vancomycin 1500mg  x1 then 750 mg IV q12h (eAUC 502, Cr 0.88mg /dL, Vd 0.5) Cefepime 2 g IV q8h -Monitor renal function, clinical status, and antibiotic plan -Order vanc levels as necessary -ID consult and NSGY consult?  Height: 5\' 7"  (170.2 cm) Weight: 90.7 kg (200 lb) IBW/kg (Calculated) : 66.1  Temp (24hrs), Avg:98.4 F (36.9 C), Min:98.4 F (36.9 C), Max:98.4 F (36.9 C)  Recent Labs  Lab 09/22/21 2045 09/23/21 0605  WBC 8.7 9.2  CREATININE 0.80 0.88     Estimated Creatinine Clearance: 85.1 mL/min (by C-G formula based on SCr of 0.88 mg/dL).    10/19 Vanc > 10/19 Cefepime >  10/20 BC > 10/20 surgical wound cx >   11/20, PharmD, [facility] Emergency Medicine Clinical Pharmacist ED RPh Phone: [phone] Main RX: [phone]

## 2021-09-23 NOTE — ED Notes (Signed)
Patient transported to MRI 

## 2021-09-23 NOTE — Progress Notes (Signed)
Pharmacy Antibiotic Note  Michael Hill is a 70 y.o. male s/p lumbar fusion 9/202 and recent MSSA bacteremia on Ancef and rifampin admitted on 09/22/2021 with back pain and surgical site infection .  Pharmacy has been consulted for Vancomycin and Cefepime  dosing.  Vancomycin 1500 mg IV given in ED at  0330  Plan: Vancomycin 1000 mg IV q12h Cefepime 2 g IV q8h  Height: 5\' 7"  (170.2 cm) Weight: 90.7 kg (200 lb) IBW/kg (Calculated) : 66.1  Temp (24hrs), Avg:98.4 F (36.9 C), Min:98.4 F (36.9 C), Max:98.4 F (36.9 C)  Recent Labs  Lab 09/22/21 2045  WBC 8.7  CREATININE 0.80    Estimated Creatinine Clearance: 93.6 mL/min (by C-G formula based on SCr of 0.8 mg/dL).    No Known Allergies   2046 09/23/2021 6:15 AM

## 2021-09-23 NOTE — Progress Notes (Signed)
Patient seen and examined personally, I reviewed the chart, history and physical and admission note, done by admitting physician this morning and agree with the same with following addendum.  Please refer to the morning admission note for more detailed plan of care.  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 Presents to the ED for worsening back pain, right leg weakness, and drainage.  In the ED slightly tachycardic afebrile blood pressure soft lab with normal WBC count hemoglobin 9.8 gram from 11.4 on 10/10, sodium 129, stable renal function ESR 126 CRP 21.4 UA without UTI blood culture x2 sent CT lumbar spine showing abscess with gas-forming organism versus air entering from an open wound posterior to L2,  collection extends into the paraspinous musculature from L4-S2 and at the level of L5 it extends anteriorly to the space between the left spinal rod and spinous process.  MRI (limited exam) showing a large fluid collection in the soft tissues of the back which likely extends to the posterior aspect of the thecal sac at the L5-S1 level.  Neurosurgery consulted. Patient was given Tylenol, Toradol, Ativan, morphine, vancomycin, cefepime, and 1 L normal saline bolus. Neurosurgery were consulted patient was admitted with broad-spectrum antibiotics.   On exam Seen in ED Wife is at the bedside. Patient c/o Constant pain on low back with radiation to Rt leg- back  wound draining Legs/ankle swelling at home- down today No bowel bladder incontinence No fever,Having dry mouth Was walking up until Tuesday but back pain got worse so stopped walking. Back wound draining Had appointment to see neurosx at 1 pm today but neurosc office asked him to [facility] ED 10/19 Has had no appetite  since surgery on oct 5th Hr IN 100-120S. Bp 93/54.  A/P  Postop wound infection: From recent back surgery 9/21, and also recent I&D on 10/5.  MRI reviewed as above with a large fluid collection in the soft tissue of the back which likely extends to the posterior aspect of the thecal sac at the level of L5-S1.  Complains of pain in the back and right lower extremity and inability to move his right lower extremities, no bowel bladder incontinence, sensation intact.  Neurosurgery has been notified awaiting input today.  I will consult ID, on vancomycin and cefepime.  Continue IV fluids, follow-up culture data.  Recent MSSA severe sepsis discharged with PICC line and Ancef/rifampin recently.  Anemia, acute on chronic likely from chronic disease no evidence of GI symptoms as per report type screen and monitor Recent Labs  Lab 09/22/21 2045 09/23/21 0605  HGB 9.8* 9.7*  HCT 30.9* 30.9*   HTN blood pressure stable, having tachycardia resume metoprolol with holding parameters Hyponatremia likely from poor oral intake, continue on IV fluids Poor oral intake since his recent surgery: Encourage intake Hyperlipidemia resume statin Deconditioning/debility: Symptomatic management PT OT eval once able

## 2021-09-23 NOTE — ED Notes (Signed)
Pt's ABD pad/dressing was replaced over his lower back surgical incision d/t drainage at wife's request.

## 2021-09-23 NOTE — Consult Note (Addendum)
I have seen and examined the patient. I have personally reviewed the clinical findings, laboratory findings, microbiological data and imaging studies. The assessment and treatment plan was discussed with the Nurse Practitioner Michael Hill. I agree with her/his recommendations except following additions/corrections.  Patient recently admitted ( 10/5-10/11/22) in the setting of post operative Lumbar wound infection involving hardware complicated with secondary bacteremia following lumbar decompression/Instrumented fusion/exploration of adjacent level fusion with extension into the pelvis on 08/24/21 who presented to the ED with complaints of worsening back and rt leg pain from Tuesday associated with increasing drainage from lumbar wound. Rt leg pain was severe to the extent he could not walk. He is taking IV and Hill abtx as instructed. Denies any issues with PICC line and IV abtx  At ED, afebrile, WBC 9.2. Na 129 CRP 21.4, ESR 126  Imaging reviewed: concerning for worsening infection/abscess  Seen by Neurosurgery and recommended MRI WWO contrast   Exam Lying in bed turning to the rt side and getting ready to go to MRI Could not complete full physical exam Wound pic 10/19 on wife's phone    Plan  Will change abtx to IV nafcillin for now. Low suspicion for anaerobic gas forming infection given open skin wound  Fu Neurosurgery recs, possibly OR tomorrow Fu MRI L spine WWO contrast  Please send tissue sample for gram stain and aerobic and anerobic cx if goes to OR Fu blood and wound cultures  Monitor CBC and BMP Following   Rosiland Oz, MD Infectious Disease Physician [facility] [address] [place], [place] [address] Phone: [phone]  Fax: [facility]    Date of Admission:  09/22/2021     Total days of antibiotics 15               Reason for Consult: Post Operative Wound  Infection   Referring Provider: Dr. Lupita Leash Primary Care Provider: Antony Contras, MD   ASSESSMENT:  Michael Hill is a 70 y/o caucasian gentleman recently hospitalized for MSSA bacteremia and post-operative wound infection s/p I&D on IV antibiotic therapy with Cefazolin and Rifampin now returning with worsening back pain and CT/MRI imaging concerning for worsening abscess. Neurosurgery with plans for I&D tomorrow. Will narrow broad spectrum coverage to nafcillin. Suspect worsening is related to increased burden of MSSA infection. Request cultures to be obtained to aid in antibiotic therapy. Monitor cultures for bacteremia. PICC line will need holiday if bacteremia is present, however okay for now. Continue pain management and supportive care per primary team.   PLAN:  Change antibiotics to Nafcillin Monitor blood cultures for bacteremia.  Plans for I&D tomorrow. Request cultures from surgery to guide antibiotic therapy Pain and remaining medical care per primary team.  ID will continue to follow.    Principal Problem:   Postoperative wound infection Active Problems:   HYPERCHOLESTEROLEMIA   Hyponatremia   Anemia    metoprolol tartrate  12.5 mg Oral BID   oxyCODONE  10 mg Oral Q12H     HPI: Michael Hill is a 70 y.o. male with previous medical history of hypertension, chronic back pain s/p L5-S1 decompression on 08/18/77 complicated by MSSA postoperative wound infection and bacteremia now admitted with worsening back pain.   Michael Hill was discharged on 09/14/21 with plan of Cefazolin with rifampin for 6 weeks secondary to post-operative wound infection involving hardware s/p I&D on 09/08/21 with blood and surgical cultures positive for  MSSA. TEE was negative. End was planned for 10/20/21. Michael Hill has been receiving his Cefazolin via right upper extremity PICC line since discharge with no adverse side effects or missed doses. Now having increased back pain right lower extremity  weakness over the past 2 days with severity of note being able to get up. Denies numbness, tingling, saddle anesthesia or changes to bowel/bladder habits. Per chart he has also been having increased drainage and low grade fevers.  CT lumbar spine with interval increased size and complexity of fluid collection in the soft tissue posterior to the lumbosacral operative bed with peripheral enhancement and internal foci of air concerning for abscess with gas forming organism. MRI lumbar spine with limited exam and large fluid collection in the soft tissues of the back which appear to extend to the posterior aspect of the thecal sac at the L5-S1 level.   Michael Hill has been afebrile since admission and antibiotics have been broadened from Cefazolin to Vancomycin and cefepime. Blood cultures drawn and pending. Neurosurgery planning for I&D tomorrow.   Review of Systems: Review of Systems  Constitutional:  Negative for chills, fever and weight loss.  Respiratory:  Negative for cough, shortness of breath and wheezing.   Cardiovascular:  Negative for chest pain and leg swelling.  Gastrointestinal:  Negative for abdominal pain, constipation, diarrhea, nausea and vomiting.  Musculoskeletal:  Positive for back pain.  Skin:  Negative for rash.    Past Medical History:  Diagnosis Date   Arthritis    Chronic back pain    scoliosis/stenosis/spondylosis   History of bronchitis    many yrs ago   History of shingles    Hyperlipidemia    takes Lipitor daily   Hypertension    takes Lotrel daily    Social History   Tobacco Use   Smoking status: Never   Smokeless tobacco: Never  Vaping Use   Vaping Use: Never used  Substance Use Topics   Alcohol use: No   Drug use: No    Family History  Problem Relation Age of Onset   Heart disease Father        before age 50   Hyperlipidemia Father    Hypertension Father    Hypertension Brother    Heart disease Brother        before age 63   Cancer  Daughter     No Known Allergies  OBJECTIVE: Blood pressure (!) 117/59, pulse 88, temperature 98.4 F (36.9 C), temperature source Oral, resp. rate (!) 21, height _0  (1.702 m), weight 90.7 kg, SpO2 97 %.  Physical Exam Constitutional:      General: He is not in acute distress.    Appearance: He is well-developed.     Comments: Lying in the right decubitus position; pleasant.   Cardiovascular:     Rate and Rhythm: Normal rate and regular rhythm.     Heart sounds: Normal heart sounds.  Pulmonary:     Effort: Pulmonary effort is normal.     Breath sounds: Normal breath sounds.  Musculoskeletal:     Comments: Dressing on back is clean and dry. Picture of wound reviewed with redness. No tenderness able to be elicited. Mild right lower extremity weakness with sensation and pulses intact.   Skin:    General: Skin is warm and dry.  Neurological:     Mental Status: He is alert and oriented to person, place, and time.  Psychiatric:        Behavior: Behavior normal.  Thought Content: Thought content normal.        Judgment: Judgment normal.    Lab Results Lab Results  Component Value Date   WBC 9.2 09/23/2021   HGB 9.7 (L) 09/23/2021   HCT 30.9 (L) 09/23/2021   MCV 92.0 09/23/2021   PLT 352 09/23/2021    Lab Results  Component Value Date   CREATININE 0.88 09/23/2021   BUN 10 09/23/2021   NA 130 (L) 09/23/2021   K 4.6 09/23/2021   CL 94 (L) 09/23/2021   CO2 24 09/23/2021    Lab Results  Component Value Date   ALT 7 09/22/2021   AST 24 09/22/2021   ALKPHOS 66 09/22/2021   BILITOT 0.6 09/22/2021     Microbiology: No results found for this or any previous visit (from the past 240 hour(s)).  Pertinent Imaging CT LUMBAR SPINE W CONTRAST  Result Date: 09/22/2021 CLINICAL DATA:  Low back pain, infection suspected, history of low back pain and abscess after laminectomy. EXAM: CT LUMBAR SPINE WITH CONTRAST TECHNIQUE: Multidetector CT imaging of the lumbar spine was  performed with intravenous contrast administration. CONTRAST:  20m OMNIPAQUE IOHEXOL 350 MG/ML SOLN COMPARISON:  09/08/2021 FINDINGS: Segmentation: 5 lumbar type vertebrae. Alignment: Levocurvature of the lumbar spine. Unchanged mild retrolisthesis L1 on L2. Vertebrae: No acute fracture. Status post L2-bi iliac fusion with ALIF at L5-S1, with no evidence of paraspinous lucency or hardware fracture. Paraspinal and other soft tissues: Peripherally enhancing fluid collection with foci of air in the subcutaneous soft tissues posterior to the lumbar spine, extending from the level of L2 to S3, measuring up to 7.9 x 5.7 x 15.6 cm (AP x TR x CC) (series 5, image 99 and series 9, image 54), previously 3.7 x 4.7 x 17.6 cm. The collection now extends into the paraspinous musculature and up to the spinal hardware from L4 through S2, most prominently at L5, where air and fluid are seen between the spinous process and left spinal rod. Additional subcutaneous edema extends from the thoracolumbar junction to the inferior sacrum. Focal skin defect posterior to L2 (series 5, image 52). Disc levels: Redemonstrated disc height loss and vacuum disc phenomenon at T11-T12, T12-L1, and L1-L2, without significant spinal canal stenosis. IMPRESSION: Interval increase in the size and complexity of a fluid collection in the soft tissues posterior to the lumbosacral operative bed, with peripheral enhancement and internal foci of air, concerning for abscess with gas-forming organisms versus air entering from an open skin wound posterior to L2. The collection extends into the paraspinous musculature from L4 through S2, and, at the level of L5, it extends anteriorly to the space between the left spinal rod and spinous process. Electronically Signed   By: AMerilyn BabaM.D.   On: 09/22/2021 23:56   MR LUMBAR SPINE WO CONTRAST  Result Date: 09/23/2021 CLINICAL DATA:  Low back pain, infection suspected EXAM: MRI LUMBAR SPINE WITHOUT CONTRAST  TECHNIQUE: Multiplanar, multisequence MR imaging of the lumbar spine was performed. No intravenous contrast was administered. COMPARISON:  05/08/2014 lumbar spine MRI, correlation is also made with same day CT L-spine FINDINGS: Extremely limited, as the patient was not able to tolerate the exam. Only scouts and a sagittal T2 sequence was obtained, which is degraded by motion artifact. The fluid collection noted on the prior CT is actually better evaluated on the scout (series 7, images 5-12), where it measures up to 7.1 cm in AP dimension and 17 cm in craniocaudal dimension (series 7, image 10). On the  axial scout images (series 8) the fluid collection can be seen extending through a laminectomy defect at the level of L5-S1, and approaching the posterior aspect of the thecal sac (series 8, image 3). The fluid collection can also be seen extending to the skin surface on this image. IMPRESSION: Limited exam due to the sequences obtained. Within this limitation, there is a large fluid collection in the soft tissues of the back, which likely extends to the posterior aspect of the thecal sac at the L5-S1 level. Electronically Signed   By: Merilyn Baba M.D.   On: 09/23/2021 03:04     Terri Piedra, [facility]  09/23/2021  2:29 PM

## 2021-09-23 NOTE — ED Notes (Signed)
Neuro surgery at bedside.

## 2021-09-24 ENCOUNTER — Encounter (HOSPITAL_COMMUNITY)
Admission: EM | Disposition: A | Discharge status: Home Health | Payer: Self-pay | Source: Home / Self Care | Attending: Internal Medicine

## 2021-09-24 ENCOUNTER — Encounter (HOSPITAL_COMMUNITY): Payer: Self-pay | Admitting: Internal Medicine

## 2021-09-24 ENCOUNTER — Inpatient Hospital Stay (HOSPITAL_COMMUNITY): Payer: Medicare PPO | Admitting: Anesthesiology

## 2021-09-24 DIAGNOSIS — T8149XA Infection following a procedure, other surgical site, initial encounter: Secondary | ICD-10-CM | POA: Diagnosis not present

## 2021-09-24 HISTORY — PX: WOUND EXPLORATION: SHX6188

## 2021-09-24 HISTORY — PX: APPLICATION OF WOUND VAC: SHX5189

## 2021-09-24 LAB — SURGICAL PCR SCREEN
MRSA, PCR: NEGATIVE
Staphylococcus aureus: NEGATIVE

## 2021-09-24 SURGERY — WOUND EXPLORATION
Anesthesia: General | Site: Spine Lumbar

## 2021-09-24 MED ORDER — ONDANSETRON HCL 4 MG/2ML IJ SOLN
INTRAMUSCULAR | Status: DC | PRN
  Administered 2021-09-24: 4 mg via INTRAVENOUS

## 2021-09-24 MED ORDER — FENTANYL CITRATE (PF) 250 MCG/5ML IJ SOLN
INTRAMUSCULAR | Status: AC
  Filled 2021-09-24: qty 5

## 2021-09-24 MED ORDER — SUCCINYLCHOLINE CHLORIDE 200 MG/10ML IV SOSY
PREFILLED_SYRINGE | INTRAVENOUS | Status: DC | PRN
  Administered 2021-09-24: 160 mg via INTRAVENOUS

## 2021-09-24 MED ORDER — THROMBIN 5000 UNITS EX SOLR
CUTANEOUS | Status: AC
  Filled 2021-09-24: qty 5000

## 2021-09-24 MED ORDER — FENTANYL CITRATE (PF) 100 MCG/2ML IJ SOLN: 25.0000 ug | INTRAMUSCULAR | Status: DC | PRN

## 2021-09-24 MED ORDER — THROMBIN 5000 UNITS EX SOLR: OROMUCOSAL | Status: DC | PRN

## 2021-09-24 MED ORDER — LACTATED RINGERS IV SOLN: INTRAVENOUS | Status: DC | PRN

## 2021-09-24 MED ORDER — ROCURONIUM BROMIDE 10 MG/ML (PF) SYRINGE
PREFILLED_SYRINGE | INTRAVENOUS | Status: DC | PRN
Start: 2021-09-24 — End: 2021-09-24
  Administered 2021-09-24: 20 mg via INTRAVENOUS

## 2021-09-24 MED ORDER — DEXAMETHASONE SODIUM PHOSPHATE 10 MG/ML IJ SOLN
INTRAMUSCULAR | Status: DC | PRN
  Administered 2021-09-24: 4 mg via INTRAVENOUS

## 2021-09-24 MED ORDER — PROPOFOL 10 MG/ML IV BOLUS
INTRAVENOUS | Status: DC | PRN
  Administered 2021-09-24: 150 mg via INTRAVENOUS

## 2021-09-24 MED ORDER — HEPARIN SODIUM (PORCINE) 5000 UNIT/ML IJ SOLN
5000.0000 [IU] | Freq: Three times a day (TID) | INTRAMUSCULAR | Status: DC
  Administered 2021-09-25 – 2021-10-01 (×20): 5000 [IU] via SUBCUTANEOUS
  Filled 2021-09-24 (×20): qty 1

## 2021-09-24 MED ORDER — ORAL CARE MOUTH RINSE: 15.0000 mL | Freq: Once | OROMUCOSAL | Status: AC

## 2021-09-24 MED ORDER — LACTATED RINGERS IV SOLN: INTRAVENOUS | Status: DC

## 2021-09-24 MED ORDER — PHENYLEPHRINE 40 MCG/ML (10ML) SYRINGE FOR IV PUSH (FOR BLOOD PRESSURE SUPPORT)
PREFILLED_SYRINGE | INTRAVENOUS | Status: DC | PRN
  Administered 2021-09-24: 120 ug via INTRAVENOUS
  Administered 2021-09-24: 80 ug via INTRAVENOUS
  Administered 2021-09-24 (×2): 120 ug via INTRAVENOUS

## 2021-09-24 MED ORDER — OXYCODONE HCL 5 MG PO TABS
5.0000 mg | ORAL_TABLET | ORAL | Status: DC | PRN
  Administered 2021-09-24 – 2021-09-28 (×22): 5 mg via ORAL
  Filled 2021-09-24 (×22): qty 1

## 2021-09-24 MED ORDER — EPHEDRINE SULFATE-NACL 50-0.9 MG/10ML-% IV SOSY
PREFILLED_SYRINGE | INTRAVENOUS | Status: DC | PRN
  Administered 2021-09-24: 5 mg via INTRAVENOUS

## 2021-09-24 MED ORDER — PHENYLEPHRINE HCL-NACL 20-0.9 MG/250ML-% IV SOLN
INTRAVENOUS | Status: DC | PRN
  Administered 2021-09-24: 40 ug/min via INTRAVENOUS

## 2021-09-24 MED ORDER — CHLORHEXIDINE GLUCONATE 0.12 % MT SOLN: 15.0000 mL | Freq: Once | OROMUCOSAL | Status: AC

## 2021-09-24 MED ORDER — FENTANYL CITRATE (PF) 100 MCG/2ML IJ SOLN
INTRAMUSCULAR | Status: AC
  Filled 2021-09-24: qty 2

## 2021-09-24 MED ORDER — CHLORHEXIDINE GLUCONATE 0.12 % MT SOLN
OROMUCOSAL | Status: AC
  Administered 2021-09-24: 15 mL via OROMUCOSAL
  Filled 2021-09-24: qty 15

## 2021-09-24 MED ORDER — HYDROMORPHONE HCL 1 MG/ML IJ SOLN
0.5000 mg | INTRAMUSCULAR | Status: DC | PRN
Start: 2021-09-24 — End: 2021-09-25
  Administered 2021-09-24 – 2021-09-25 (×2): 0.5 mg via INTRAVENOUS
  Filled 2021-09-24 (×2): qty 0.5

## 2021-09-24 MED ORDER — 0.9 % SODIUM CHLORIDE (POUR BTL) OPTIME
TOPICAL | Status: DC | PRN
  Administered 2021-09-24: 1000 mL

## 2021-09-24 MED ORDER — VANCOMYCIN HCL 1000 MG IV SOLR
INTRAVENOUS | Status: AC
  Filled 2021-09-24: qty 20

## 2021-09-24 MED ORDER — SUGAMMADEX SODIUM 200 MG/2ML IV SOLN
INTRAVENOUS | Status: DC | PRN
  Administered 2021-09-24: 200 mg via INTRAVENOUS

## 2021-09-24 MED ORDER — AMISULPRIDE (ANTIEMETIC) 5 MG/2ML IV SOLN: 10.0000 mg | Freq: Once | INTRAVENOUS | Status: DC | PRN

## 2021-09-24 MED ORDER — LIDOCAINE 2% (20 MG/ML) 5 ML SYRINGE
INTRAMUSCULAR | Status: DC | PRN
  Administered 2021-09-24: 60 mg via INTRAVENOUS

## 2021-09-24 MED ORDER — FENTANYL CITRATE (PF) 250 MCG/5ML IJ SOLN
INTRAMUSCULAR | Status: DC | PRN
  Administered 2021-09-24: 100 ug via INTRAVENOUS
  Administered 2021-09-24: 50 ug via INTRAVENOUS

## 2021-09-24 SURGICAL SUPPLY — 49 items
BAG BANDED W/RUBBER/TAPE 36X54 (MISCELLANEOUS) IMPLANT
BAG COUNTER SPONGE SURGICOUNT (BAG) ×3 IMPLANT
BAG EQP BAND 135X91 W/RBR TAPE (MISCELLANEOUS)
BAG SPNG CNTER NS LX DISP (BAG) ×2
CARTRIDGE OIL MAESTRO DRILL (MISCELLANEOUS) ×2 IMPLANT
CNTNR URN SCR LID CUP LEK RST (MISCELLANEOUS) ×2 IMPLANT
CONT SPEC 4OZ STRL OR WHT (MISCELLANEOUS) ×3
COVER BACK TABLE 60X90IN (DRAPES) IMPLANT
DECANTER SPIKE VIAL GLASS SM (MISCELLANEOUS) ×3 IMPLANT
DIFFUSER DRILL AIR PNEUMATIC (MISCELLANEOUS) ×3 IMPLANT
DRAIN JACKSON RD 7FR 3/32 (WOUND CARE) IMPLANT
DRAPE LAPAROTOMY 100X72X124 (DRAPES) ×3 IMPLANT
DRAPE SURG 17X23 STRL (DRAPES) ×3 IMPLANT
DRSG VERAFLO VAC MED (GAUZE/BANDAGES/DRESSINGS) ×3 IMPLANT
DURAPREP 26ML APPLICATOR (WOUND CARE) ×3 IMPLANT
ELECT COATED BLADE 2.86 ST (ELECTRODE) ×3 IMPLANT
ELECT REM PT RETURN 9FT ADLT (ELECTROSURGICAL) ×3
ELECTRODE REM PT RTRN 9FT ADLT (ELECTROSURGICAL) ×2 IMPLANT
GAUZE 4X4 16PLY ~~LOC~~+RFID DBL (SPONGE) IMPLANT
GAUZE SPONGE 4X4 12PLY STRL (GAUZE/BANDAGES/DRESSINGS) ×3 IMPLANT
GLOVE SRG 8 PF TXTR STRL LF DI (GLOVE) ×4 IMPLANT
GLOVE SURG LTX SZ8 (GLOVE) ×6 IMPLANT
GLOVE SURG UNDER POLY LF SZ8 (GLOVE) ×6
GOWN STRL REUS W/ TWL LRG LVL3 (GOWN DISPOSABLE) IMPLANT
GOWN STRL REUS W/ TWL XL LVL3 (GOWN DISPOSABLE) ×2 IMPLANT
GOWN STRL REUS W/TWL 2XL LVL3 (GOWN DISPOSABLE) IMPLANT
GOWN STRL REUS W/TWL LRG LVL3 (GOWN DISPOSABLE)
GOWN STRL REUS W/TWL XL LVL3 (GOWN DISPOSABLE) ×3
KIT BASIN OR (CUSTOM PROCEDURE TRAY) ×3 IMPLANT
KIT POSITION SURG JACKSON T1 (MISCELLANEOUS) ×3 IMPLANT
KIT TURNOVER KIT B (KITS) ×3 IMPLANT
MARKER SKIN DUAL TIP RULER LAB (MISCELLANEOUS) ×3 IMPLANT
NEEDLE HYPO 25X1 1.5 SAFETY (NEEDLE) ×3 IMPLANT
NS IRRIG 1000ML POUR BTL (IV SOLUTION) ×3 IMPLANT
OIL CARTRIDGE MAESTRO DRILL (MISCELLANEOUS) ×3
PACK LAMINECTOMY NEURO (CUSTOM PROCEDURE TRAY) ×3 IMPLANT
PAD ARMBOARD 7.5X6 YLW CONV (MISCELLANEOUS) ×9 IMPLANT
SPONGE T-LAP 4X18 ~~LOC~~+RFID (SPONGE) IMPLANT
STAPLER VISISTAT 35W (STAPLE) IMPLANT
SUT VIC AB 0 CT1 27 (SUTURE) ×3
SUT VIC AB 0 CT1 27XBRD ANBCTR (SUTURE) ×2 IMPLANT
SUT VIC AB 2-0 CP2 18 (SUTURE) ×3 IMPLANT
SUT VIC AB 3-0 SH 8-18 (SUTURE) IMPLANT
SWAB COLLECTION DEVICE MRSA (MISCELLANEOUS) IMPLANT
SWAB CULTURE ESWAB REG 1ML (MISCELLANEOUS) IMPLANT
TOWEL GREEN STERILE (TOWEL DISPOSABLE) IMPLANT
TOWEL GREEN STERILE FF (TOWEL DISPOSABLE) IMPLANT
TRAY FOLEY MTR SLVR 16FR STAT (SET/KITS/TRAYS/PACK) IMPLANT
WATER STERILE IRR 1000ML POUR (IV SOLUTION) ×3 IMPLANT

## 2021-09-24 NOTE — OR Nursing (Signed)
Wound VAC applied to patients incision site on lower back. MD used 2 sponges to seal the wound.

## 2021-09-24 NOTE — Progress Notes (Signed)
[facility] Infectious Diseases Follow Up Note  Patient Identification: Patient Name: Michael Hill MRN: [id number] Admit Date: 09/22/2021  7:30 PM Age: 70 y.o.Today's Date: 09/24/2021   Reason for Visit: Post op Lumbar wound infection   Principal Problem:   Postoperative wound infection Active Problems:   HYPERCHOLESTEROLEMIA   Hyponatremia   Anemia   Antibiotics:  Preadmit - cefazolin and PO rifampin  Vancomycin 10/19 Cefepime 10/19 Nafcillin 10/20-current   Lines/Hardwares: PICC rt arm and spinal hardware   Interval Events: afebrile, no leukocytosis    Assessment Post operative Lumbar wound infection involving hardware complicated with secondary bacteremia( MSSA) following lumbar decompression/Instrumented fusion/exploration of adjacent level fusion with extension into the pelvis on 08/24/21, NOW admitted with  Rt Lower extremity weakness with worsening fluid collection ( possibly abscess) in the lower lumbar spine  -     Plan to go to OR today noted    Recommendations Continue IV nafcillin for now given patient presented with Rt Lower extremity weakness with worsening fluid collection in MRI L spine while on cefazolin tx pending OR findings  Monitor CBC and BMP Please send tissue samples for gram stain and aerobic and anaerobic cx from OR today  Monitor CBC, BMP and blood cx/wound cx  He would like his back pain to be well controlled before going to OR. Discussed with wife at bedside  Dr Earlene Plater on call this weekend and available with questions. Otherwise, new ID team will follow up on Monday.   Rest of the management as per the primary team. Thank you for the consult. Please page with pertinent questions or concerns.  ______________________________________________________________________ Subjective patient seen and examined at the bedside.  Vitals BP 112/90 (BP Location: Left Arm)   Pulse 85   Temp 98.8 F  (37.1 C) (Oral)   Resp 16   Ht 5\' 7"  (1.702 m)   Wt 90.7 kg   SpO2 93%   BMI 31.32 kg/m     Physical Exam Constitutional:  Lying in bed, not in acute distress    Comments:   Cardiovascular:     Rate and Rhythm: Normal rate and regular rhythm.     Heart sounds:  Pulmonary:     Effort: Pulmonary effort is normal.     Comments:   Abdominal:     Palpations: Abdomen is soft.     Tenderness: Non tender and non distended   Musculoskeletal:        General: No swelling or tenderness.   Skin:    Comments: No lesions or rashes   Neurological: Power: RT leg 4/5 and left leg 5/5, bilateral sensation intact, awake, alert and oriented   Psychiatric:        Mood and Affect: Mood normal. Calm and cooperative     Pertinent Microbiology Results for orders placed or performed during the hospital encounter of 09/22/21  Surgical pcr screen     Status: None   Collection Time: 09/23/21  8:38 PM   Specimen: Nasal Mucosa; Nasal Swab  Result Value Ref Range Status   MRSA, PCR NEGATIVE NEGATIVE Final   Staphylococcus aureus NEGATIVE NEGATIVE Final    Comment: (NOTE) The Xpert SA Assay (FDA approved for NASAL specimens in patients 13 years of age and older), is one component of a comprehensive surveillance program. It is not intended to diagnose infection nor to guide or monitor treatment. Performed at [facility], [address], [place], [place]      Pertinent Lab. CBC  Latest Ref Rng & Units 09/23/2021 09/22/2021 09/13/2021  WBC 4.0 - 10.5 K/uL 9.2 8.7 10.3  Hemoglobin 13.0 - 17.0 g/dL 3.2(G) 4.0(N) 11.4(L)  Hematocrit 39.0 - 52.0 % 30.9(L) 30.9(L) 35.0(L)  Platelets 150 - 400 K/uL 352 314 317   CMP Latest Ref Rng & Units 09/23/2021 09/22/2021 09/13/2021  Glucose 70 - 99 mg/dL 027(O) 536(U) 440(H)  BUN 8 - 23 mg/dL 10 9 47(Q)  Creatinine 0.61 - 1.24 mg/dL 2.59 5.63 8.75  Sodium 135 - 145 mmol/L 130(L) 129(L) 135  Potassium 3.5 - 5.1 mmol/L 4.6 4.6 3.9   Chloride 98 - 111 mmol/L 94(L) 92(L) 99  CO2 22 - 32 mmol/L 24 27 26   Calcium 8.9 - 10.3 mg/dL ) 6.4(P) 3.2(R)  Total Protein 6.5 - 8.1 g/dL - 6.2(L) 5.4(L)  Total Bilirubin 0.3 - 1.2 mg/dL - 0.6 0.9  Alkaline Phos 38 - 126 U/L - 66 64  AST 15 - 41 U/L - 24 27  ALT 0 - 44 U/L - 7 24     Pertinent Imaging today Plain films and CT images have been personally visualized and interpreted; radiology reports have been reviewed. Decision making incorporated into the Impression / Recommendations.  CT LUMBAR SPINE W CONTRAST  Result Date: 09/22/2021 CLINICAL DATA:  Low back pain, infection suspected, history of low back pain and abscess after laminectomy. EXAM: CT LUMBAR SPINE WITH CONTRAST TECHNIQUE: Multidetector CT imaging of the lumbar spine was performed with intravenous contrast administration. CONTRAST:  33mL OMNIPAQUE IOHEXOL 350 MG/ML SOLN COMPARISON:  09/08/2021 FINDINGS: Segmentation: 5 lumbar type vertebrae. Alignment: Levocurvature of the lumbar spine. Unchanged mild retrolisthesis L1 on L2. Vertebrae: No acute fracture. Status post L2-bi iliac fusion with ALIF at L5-S1, with no evidence of paraspinous lucency or hardware fracture. Paraspinal and other soft tissues: Peripherally enhancing fluid collection with foci of air in the subcutaneous soft tissues posterior to the lumbar spine, extending from the level of L2 to S3, measuring up to 7.9 x 5.7 x 15.6 cm (AP x TR x CC) (series 5, image 99 and series 9, image 54), previously 3.7 x 4.7 x 17.6 cm. The collection now extends into the paraspinous musculature and up to the spinal hardware from L4 through S2, most prominently at L5, where air and fluid are seen between the spinous process and left spinal rod. Additional subcutaneous edema extends from the thoracolumbar junction to the inferior sacrum. Focal skin defect posterior to L2 (series 5, image 52). Disc levels: Redemonstrated disc height loss and vacuum disc phenomenon at T11-T12,  T12-L1, and L1-L2, without significant spinal canal stenosis. IMPRESSION: Interval increase in the size and complexity of a fluid collection in the soft tissues posterior to the lumbosacral operative bed, with peripheral enhancement and internal foci of air, concerning for abscess with gas-forming organisms versus air entering from an open skin wound posterior to L2. The collection extends into the paraspinous musculature from L4 through S2, and, at the level of L5, it extends anteriorly to the space between the left spinal rod and spinous process. Electronically Signed   By: 11/08/2021 M.D.   On: 09/22/2021 23:56   MR LUMBAR SPINE WO CONTRAST  Result Date: 09/23/2021 CLINICAL DATA:  Low back pain, infection suspected EXAM: MRI LUMBAR SPINE WITHOUT CONTRAST TECHNIQUE: Multiplanar, multisequence MR imaging of the lumbar spine was performed. No intravenous contrast was administered. COMPARISON:  05/08/2014 lumbar spine MRI, correlation is also made with same day CT L-spine FINDINGS: Extremely limited, as the patient was  not able to tolerate the exam. Only scouts and a sagittal T2 sequence was obtained, which is degraded by motion artifact. The fluid collection noted on the prior CT is actually better evaluated on the scout (series 7, images 5-12), where it measures up to 7.1 cm in AP dimension and 17 cm in craniocaudal dimension (series 7, image 10). On the axial scout images (series 8) the fluid collection can be seen extending through a laminectomy defect at the level of L5-S1, and approaching the posterior aspect of the thecal sac (series 8, image 3). The fluid collection can also be seen extending to the skin surface on this image. IMPRESSION: Limited exam due to the sequences obtained. Within this limitation, there is a large fluid collection in the soft tissues of the back, which likely extends to the posterior aspect of the thecal sac at the L5-S1 level. Electronically Signed   By: Wiliam Ke M.D.    On: 09/23/2021 03:04   MR Lumbar Spine W Wo Contrast  Result Date: 09/23/2021 CLINICAL DATA:  Right leg weakness. History of lumbar surgery with subsequent wound infection EXAM: MRI LUMBAR SPINE WITHOUT AND WITH CONTRAST TECHNIQUE: Multiplanar and multiecho pulse sequences of the lumbar spine were obtained without and with intravenous contrast. CONTRAST:  33mL GADAVIST GADOBUTROL 1 MMOL/ML IV SOLN COMPARISON:  CT 09/22/2021 FINDINGS: Technical note: Significantly limited exam secondary to patient motion artifact. Evaluation of the spinal canal is significantly degraded by motion artifact as well as metallic susceptibility artifact related to patient's fusion hardware. Segmentation:  Standard. Alignment: Lumbar levocurvature. No significant static listhesis is evident. Vertebrae: Susceptibility artifact related to L2-bi-iliac fusion with interbody spacers at the L2-3 through L5-S1 levels. Conus medullaris and cauda equina: Conus not definitively visualized. Cauda quadrant nerve roots are not able to be adequately assessed by artifact. Paraspinal and other soft tissues: Peripherally enhancing fluid collection within the soft tissues posterior to the lower lumbar spine measures approximately 11.1 x 3.3 cm (series 13, image 6). It is difficult to determine the anterior extent of the collection secondary to artifact. Disc levels: Evaluation of the disc levels is significantly degraded. There may be mild canal stenosis at T12-L1 and L1-2. Nondiagnostic assessment for abnormal fluid collections in or around the spinal canal. IMPRESSION: 1. Significantly limited exam secondary to patient motion artifact as well as metallic susceptibility artifact related to patient's fusion hardware. 2. Peripherally enhancing fluid collection within the soft tissues posterior to the lower lumbar spine measuring up to 11.1 x 3.3 cm. It is difficult to determine the anterior extent of the collection secondary to artifact. Collection  appears decreased in size compared to the previous CT of 09/22/2021. 3. Suspected mild canal stenosis at T12-L1 and L1-2. 4. Evaluation of the remaining spinal levels is significantly degraded. Electronically Signed   By: Duanne Guess D.O.   On: 09/23/2021 16:11    I spent more than 35 minutes for this patient encounter including review of prior medical records, coordination of care  with greater than 50% of time being face to face/counseling and discussing diagnostics/treatment plan with the patient/family.  Electronically signed by:   Odette Fraction, MD Infectious Disease Physician [facility] Pager: [phone]

## 2021-09-24 NOTE — Progress Notes (Signed)
Per [facility] NP patient can travel to without tele

## 2021-09-24 NOTE — Transfer of Care (Signed)
Immediate Anesthesia Transfer of Care Note  Patient: Michael Hill  Procedure(s) Performed: Lumbar Wound Irrigation and Debridement. (Spine Lumbar) APPLICATION OF WOUND VAC  Patient Location: PACU  Anesthesia Type:General  Level of Consciousness: drowsy and patient cooperative  Airway & Oxygen Therapy: Patient Spontanous Breathing and Patient connected to face mask oxygen  Post-op Assessment: Report given to RN and Post -op Vital signs reviewed and stable  Post vital signs: Reviewed and stable  Last Vitals:  Vitals Value Taken Time  BP 113/85   Temp    Pulse 90 09/24/21 1534  Resp 21 09/24/21 1534  SpO2 96 % 09/24/21 1534  Vitals shown include unvalidated device data.  Last Pain:  Vitals:   09/24/21 1326  TempSrc:   PainSc: 5       Patients Stated Pain Goal: 3 (09/24/21 1326)  Complications: No notable events documented.

## 2021-09-24 NOTE — Progress Notes (Signed)
Subjective: Patient reports that his pain is better controlled this morning. No acute events overnight. He is ready to proceed with surgery today.   Objective: Vital signs in last 24 hours: Temp:  [99.1 F (37.3 C)-100.3 F (37.9 C)] 100.2 F (37.9 C) (10/21 0354) Pulse Rate:  [54-124] 98 (10/21 0807) Resp:  [11-25] 17 (10/21 0354) BP: (93-153)/(48-96) 118/77 (10/21 0807) SpO2:  [91 %-100 %] 93 % (10/21 0354)  Intake/Output from previous day: 10/20 0701 - 10/21 0700 In: 1058 [P.O.:30; I.V.:1000; IV Piggyback:28] Out: 1250 [Urine:1250] Intake/Output this shift: No intake/output data recorded.  Physical Exam: Patient is awake, A/O X 4, and conversant. Speech is fluent and appropriate. MAEW. RLE 4/5, LLE 5/5. Sensation to light touch is intact. PERLA, EOMI. CNs grossly intact. Dressing with serous drainage that appears to be coming from the superior portion of the wound. Minimal drainage was expressed on exam. Moderate peri wound erythema.      Lab Results: Recent Labs    09/22/21 2045 09/23/21 0605  WBC 8.7 9.2  HGB 9.8* 9.7*  HCT 30.9* 30.9*  PLT 314 352   BMET Recent Labs    09/22/21 2045 09/23/21 0605  NA 129* 130*  K 4.6 4.6  CL 92* 94*  CO2 27 24  GLUCOSE 114* 109*  BUN 9 10  CREATININE 0.80 0.88  CALCIUM 8.7* 8.6*    Studies/Results: CT LUMBAR SPINE W CONTRAST  Result Date: 09/22/2021 CLINICAL DATA:  Low back pain, infection suspected, history of low back pain and abscess after laminectomy. EXAM: CT LUMBAR SPINE WITH CONTRAST TECHNIQUE: Multidetector CT imaging of the lumbar spine was performed with intravenous contrast administration. CONTRAST:  43mL OMNIPAQUE IOHEXOL 350 MG/ML SOLN COMPARISON:  09/08/2021 FINDINGS: Segmentation: 5 lumbar type vertebrae. Alignment: Levocurvature of the lumbar spine. Unchanged mild retrolisthesis L1 on L2. Vertebrae: No acute fracture. Status post L2-bi iliac fusion with ALIF at L5-S1, with no evidence of paraspinous lucency  or hardware fracture. Paraspinal and other soft tissues: Peripherally enhancing fluid collection with foci of air in the subcutaneous soft tissues posterior to the lumbar spine, extending from the level of L2 to S3, measuring up to 7.9 x 5.7 x 15.6 cm (AP x TR x CC) (series 5, image 99 and series 9, image 54), previously 3.7 x 4.7 x 17.6 cm. The collection now extends into the paraspinous musculature and up to the spinal hardware from L4 through S2, most prominently at L5, where air and fluid are seen between the spinous process and left spinal rod. Additional subcutaneous edema extends from the thoracolumbar junction to the inferior sacrum. Focal skin defect posterior to L2 (series 5, image 52). Disc levels: Redemonstrated disc height loss and vacuum disc phenomenon at T11-T12, T12-L1, and L1-L2, without significant spinal canal stenosis. IMPRESSION: Interval increase in the size and complexity of a fluid collection in the soft tissues posterior to the lumbosacral operative bed, with peripheral enhancement and internal foci of air, concerning for abscess with gas-forming organisms versus air entering from an open skin wound posterior to L2. The collection extends into the paraspinous musculature from L4 through S2, and, at the level of L5, it extends anteriorly to the space between the left spinal rod and spinous process. Electronically Signed   By: Wiliam Ke M.D.   On: 09/22/2021 23:56   MR LUMBAR SPINE WO CONTRAST  Result Date: 09/23/2021 CLINICAL DATA:  Low back pain, infection suspected EXAM: MRI LUMBAR SPINE WITHOUT CONTRAST TECHNIQUE: Multiplanar, multisequence MR imaging of the lumbar  spine was performed. No intravenous contrast was administered. COMPARISON:  05/08/2014 lumbar spine MRI, correlation is also made with same day CT L-spine FINDINGS: Extremely limited, as the patient was not able to tolerate the exam. Only scouts and a sagittal T2 sequence was obtained, which is degraded by motion  artifact. The fluid collection noted on the prior CT is actually better evaluated on the scout (series 7, images 5-12), where it measures up to 7.1 cm in AP dimension and 17 cm in craniocaudal dimension (series 7, image 10). On the axial scout images (series 8) the fluid collection can be seen extending through a laminectomy defect at the level of L5-S1, and approaching the posterior aspect of the thecal sac (series 8, image 3). The fluid collection can also be seen extending to the skin surface on this image. IMPRESSION: Limited exam due to the sequences obtained. Within this limitation, there is a large fluid collection in the soft tissues of the back, which likely extends to the posterior aspect of the thecal sac at the L5-S1 level. Electronically Signed   By: Wiliam Ke M.D.   On: 09/23/2021 03:04   MR Lumbar Spine W Wo Contrast  Result Date: 09/23/2021 CLINICAL DATA:  Right leg weakness. History of lumbar surgery with subsequent wound infection EXAM: MRI LUMBAR SPINE WITHOUT AND WITH CONTRAST TECHNIQUE: Multiplanar and multiecho pulse sequences of the lumbar spine were obtained without and with intravenous contrast. CONTRAST:  16mL GADAVIST GADOBUTROL 1 MMOL/ML IV SOLN COMPARISON:  CT 09/22/2021 FINDINGS: Technical note: Significantly limited exam secondary to patient motion artifact. Evaluation of the spinal canal is significantly degraded by motion artifact as well as metallic susceptibility artifact related to patient's fusion hardware. Segmentation:  Standard. Alignment: Lumbar levocurvature. No significant static listhesis is evident. Vertebrae: Susceptibility artifact related to L2-bi-iliac fusion with interbody spacers at the L2-3 through L5-S1 levels. Conus medullaris and cauda equina: Conus not definitively visualized. Cauda quadrant nerve roots are not able to be adequately assessed by artifact. Paraspinal and other soft tissues: Peripherally enhancing fluid collection within the soft tissues  posterior to the lower lumbar spine measures approximately 11.1 x 3.3 cm (series 13, image 6). It is difficult to determine the anterior extent of the collection secondary to artifact. Disc levels: Evaluation of the disc levels is significantly degraded. There may be mild canal stenosis at T12-L1 and L1-2. Nondiagnostic assessment for abnormal fluid collections in or around the spinal canal. IMPRESSION: 1. Significantly limited exam secondary to patient motion artifact as well as metallic susceptibility artifact related to patient's fusion hardware. 2. Peripherally enhancing fluid collection within the soft tissues posterior to the lower lumbar spine measuring up to 11.1 x 3.3 cm. It is difficult to determine the anterior extent of the collection secondary to artifact. Collection appears decreased in size compared to the previous CT of 09/22/2021. 3. Suspected mild canal stenosis at T12-L1 and L1-2. 4. Evaluation of the remaining spinal levels is significantly degraded. Electronically Signed   By: Duanne Guess D.O.   On: 09/23/2021 16:11    Assessment/Plan: 70 y.o. male who is s/p L5-S1 decompression/fusion/exploration of adjacent level fusion with extension to pelvis on 08/25/21. He was readmitted on 09/08/2021-09/15/2021 with a lumbar wound infection and severe sepsis due to MSSA bacteremia. I&D of his lumbar wound on 09/08/2021. Discharged with PICC and abx per ID. Now with reoccurrence of his lumbosacral pain and RLE radiculopathy with intermittent wound drainage. CT and MRI showing fluid collection within the lumbar wound. Plan for lumbar  wound exploration with irrigation and debridement today.    -Lumbar wound exploration with irrigation and debridement today.  -Continue NPO status -Obtain consent  -Blood and wound cultures pending -Abx per ID -Continue aggressive pain control     LOS: 1 day     Council [employer]ic, DNP, NP-C 09/24/2021, 8:08 AM

## 2021-09-24 NOTE — Progress Notes (Signed)
   09/23/21 2325  Assess: MEWS Score  Level of Consciousness Alert  Assess: MEWS Score  MEWS Temp 0  MEWS Systolic 0  MEWS Pulse 2  MEWS RR 0  MEWS LOC 0  MEWS Score 2  MEWS Score Color Yellow  Assess: if the MEWS score is Yellow or Red  Were vital signs taken at a resting state? Yes  Focused Assessment No change from prior assessment  Early Detection of Sepsis Score *See Row Information* Low  MEWS guidelines implemented *See Row Information* Yes  Treat  MEWS Interventions Other (Comment)  Take Vital Signs  Increase Vital Sign Frequency  Yellow: Q 2hr X 2 then Q 4hr X 2, if remains yellow, continue Q 4hrs  Escalate  MEWS: Escalate Yellow: discuss with charge nurse/RN and consider discussing with provider and RRT  Notify: Charge Nurse/RN  Name of Charge Nurse/RN Notified Daja, RN  Document  Patient Outcome Other (Comment)  Progress note created (see row info) Yes

## 2021-09-24 NOTE — Anesthesia Preprocedure Evaluation (Signed)
Anesthesia Evaluation  Patient identified by MRN, date of birth, ID band Patient awake    Reviewed: Allergy & Precautions, NPO status , Patient's Chart, lab work & pertinent test results  History of Anesthesia Complications Negative for: history of anesthetic complications  Airway Mallampati: II  TM Distance: >3 FB Neck ROM: Full    Dental   Pulmonary neg pulmonary ROS,    breath sounds clear to auscultation       Cardiovascular hypertension, Pt. on medications  Rhythm:Regular Rate:Normal     Neuro/Psych  Neuromuscular disease negative psych ROS   GI/Hepatic negative GI ROS, Neg liver ROS,   Endo/Other  negative endocrine ROS  Renal/GU negative Renal ROS     Musculoskeletal  (+) Arthritis ,   Abdominal   Peds  Hematology  (+) anemia ,   Anesthesia Other Findings Covid+   Reproductive/Obstetrics                             Anesthesia Physical  Anesthesia Plan  ASA: 2  Anesthesia Plan: General   Post-op Pain Management:    Induction: Intravenous and Rapid sequence  PONV Risk Score and Plan: 2 and Treatment may vary due to age or medical condition, Ondansetron and Dexamethasone  Airway Management Planned: Oral ETT  Additional Equipment: None  Intra-op Plan:   Post-operative Plan: Extubation in OR  Informed Consent: I have reviewed the patients History and Physical, chart, labs and discussed the procedure including the risks, benefits and alternatives for the proposed anesthesia with the patient or authorized representative who has indicated his/her understanding and acceptance.     Dental advisory given  Plan Discussed with: CRNA and Anesthesiologist  Anesthesia Plan Comments:         Anesthesia Quick Evaluation

## 2021-09-24 NOTE — Progress Notes (Signed)
   Providing Compassionate, Quality Care - Together  NEUROSURGERY PROGRESS NOTE   S: Patient seen and examined.  Continues to complain of low back pain.  O: EXAM:  BP (!) 113/55   Pulse 82   Temp 99.2 F (37.3 C) (Oral)   Resp 20   Ht 5\' 7"  (1.702 m)   Wt 90.7 kg   SpO2 95%   BMI 31.32 kg/m   Awake, alert, oriented  PERRLA Speech fluent, appropriate  CNs grossly intact  5/5 BUE/BLE  Intermittently draining lumbar wound, tenderness to palpation  ASSESSMENT:  70 y.o. male with   Lumbar wound infection Pseudoarthrosis, L5-S1  PLAN: -OR today for incision and debridement, placement of wound VAC and lumbar wound. -We will send cultures -Risks benefits and expected outcomes were discussed with the patient  -Continue antibiotics per infectious disease.    Thank you for allowing me to participate in this patient's care.  Please do not hesitate to call with questions or concerns.   78, DO Neurosurgeon [facility] Cell: [phone]

## 2021-09-24 NOTE — Progress Notes (Signed)
PROGRESS NOTE    Michael Hill  TZG:[id number] DOB: 1951-09-08 DOA: 09/22/2021 PCP: Antony Contras, MD   Chief Complaint  Patient presents with   Post-op Problem  Brief Narrative/Hospital Course:  Michael Hill, 70 y.o. 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 Presents to the ED for worsening back pain, right leg weakness, and drainage.   In the ED slightly tachycardic afebrile blood pressure soft lab with normal WBC count hemoglobin 9.8 gram from 11.4 on 10/10, sodium 129, stable renal function ESR 126 CRP 21.4 UA without UTI blood culture x2 sent CT lumbar spine showing abscess with gas-forming organism versus air entering from an open wound posterior to L2,  collection extends into the paraspinous musculature from L4-S2 and at the level of L5 it extends anteriorly to the space between the left spinal rod and spinous process.  MRI (limited exam) showing a large fluid collection in the soft tissues of the back which likely extends to the posterior aspect of the thecal sac at the L5-S1 level.  Neurosurgery consulted. Patient was given Tylenol, Toradol, Ativan, morphine, vancomycin, cefepime, and 1 L normal saline bolus. Neurosurgery were consulted patient was admitted with broad-spectrum antibiotics   Repeat MRI 10/20 "1. Significantly limited exam secondary to patient motion artifact as well as metallic susceptibility artifact related to patient's fusion  hardware. 2. Peripherally enhancing fluid collection within the soft tissues posterior to the lower lumbar spine measuring up to 11.1 x 3.3 cm. It is difficult to determine the anterior extent of the collection secondary to artifact. Collection appears decreased in size compared to the previous CT of 09/22/2021. 3. Suspected mild canal stenosis at T12-L1 and L1-2. 4. Evaluation of the remaining spinal levels is significantly degraded."  Subjective: Pain better this am and moving in B/L LE Overnight low-grade fever, stable vitals Has a foleyt in now Wife not at bedside  Assessment & Plan:  Postop wound infection: From recent back surgery 9/21, and also recent I&D on 10/5.  MRI l spine-large fluid collection in the soft tissue of the back which likely extends to the posterior aspect of the thecal sac at the level of L5-S1.  Repeat MRI LS spine 10/20 limited due to motion but with showing enhancing fluid collection-see report.  Pain better controlled and able to move his lower extremities well today.Neurosurgery is following closely repeat MRI lumbar spine ordered as above, ordered for further plan, ID has been consulted antibiotics changed to nafcillin.  Follow blood culture and  OR culture  Recent MSSA severe sepsis discharged with PICC line and Ancef/rifampin recently.  Antibiotics as above  Anemia, acute on chronic likely from chronic disease no evidence of GI bleeding Recent Labs  Lab 09/22/21 2045 09/23/21 0605  HGB 9.8* 9.7*  HCT 30.9* 30.9*   HTN blood pressure stable, continue low-dose  metoprolol with holding parameters  Hyponatremia likely from poor oral intake, continue on IV fluids.  Monitor labs continue Recent Labs  Lab 09/22/21 2045 09/23/21 0605  NA 129* 130*    Poor oral intake since  his recent surgery: Encourage intake.  Hyperlipidemia: cont statin  Deconditioning/debility: Symptomatic management PT OT eval once able  Class I Obesity:Patient's Body mass  index is 31.32 kg/m. : Will benefit with PCP follow-up, weight loss  healthy lifestyle and outpatient sleep evaluation.  DVT prophylaxis: SCDs Start: 09/23/21 0532 Code Status:   Code Status: Full Code Family Communication: plan of care discussed with patient at bedside. Status is: Inpatient  Remains inpatient appropriate because: For ongoing management of his postop wound infection   Objective: Vitals last 24 hrs: Vitals:   09/24/21 0227 09/24/21 0229 09/24/21 0354 09/24/21 0807  BP:  (!) 122/58 116/71 118/77  Pulse:  91 97 98  Resp:  18 17   Temp: 100.3 F (37.9 C)  100.2 F (37.9 C)   TempSrc: Oral  Oral   SpO2:  92% 93%   Weight:      Height:       Weight change:   Intake/Output Summary (Last 24 hours) at 09/24/2021 0818 Last data filed at 09/24/2021 0400 Gross per 24 hour  Intake 1058 ml  Output 1250 ml  Net -192 ml   Net IO Since Admission: 1,208 mL [09/24/21 0818]   Physical Examination: General exam: Aa0x3, in mild pain pleasant HEENT:Oral mucosa moist, Ear/Nose WNL grossly,dentition normal. Respiratory system: B/l clear BS, no use of accessory muscle, non tender. Cardiovascular system: S1 & S2 +,No JVD. Gastrointestinal system: Abdomen soft, NT,ND, BS+. Nervous System:Alert, awake, moving extremities. Extremities: edema none, distal peripheral pulses palpable.  Skin: No rashes, no icterus. MSK: Normal muscle bulk, tone, power. Back with dressing in place  Medications reviewed:  Scheduled Meds:  atorvastatin  20 mg Oral Daily   Chlorhexidine Gluconate Cloth  6 each Topical Daily   docusate sodium  100 mg Oral BID   famotidine  20 mg Oral Daily   metoprolol tartrate  12.5 mg Oral BID   oxyCODONE  10 mg Oral Q12H   Continuous Infusions:  dextrose 5 % and 0.9% NaCl Stopped (09/24/21 0726)   nafcillin (NAFCIL) continuous infusion 12 g (09/23/21 2026)   Diet Order             Diet NPO time specified Except for: Sips with Meds  Diet effective  midnight                          Weight change:   Wt Readings from Last 3 Encounters:  09/22/21 90.7 kg  09/08/21 97.6 kg  08/24/21 93 kg     Consultants:see note  Procedures:see note Antimicrobials: Anti-infectives (From admission, onward)    Start     Dose/Rate Route Frequency Ordered Stop   09/23/21 1900  nafcillin 12 g in sodium chloride 0.9 % 500 mL continuous infusion        12 g 20.8 mL/hr over 24 Hours Intravenous Every 24 hours 09/23/21 1429     09/23/21 1800  vancomycin (VANCOCIN) IVPB 1000 mg/200 mL premix  Status:  Discontinued        1,000 mg 200 mL/hr over 60 Minutes Intravenous Every 12 hours 09/23/21 0620 09/23/21 1119   09/23/21 1530  vancomycin (VANCOREADY) IVPB 750 mg/150 mL  Status:  Discontinued        750 mg 150 mL/hr over 60 Minutes Intravenous Every 12 hours 09/23/21 1119 09/23/21 1429   09/23/21 1000  ceFEPIme (MAXIPIME) 2 g in sodium chloride 0.9 % 100 mL IVPB  Status:  Discontinued  2 g 200 mL/hr over 30 Minutes Intravenous Every 8 hours 09/23/21 0620 09/23/21 1429   09/23/21 0030  vancomycin (VANCOREADY) IVPB 1500 mg/300 mL        1,500 mg 150 mL/hr over 120 Minutes Intravenous  Once 09/23/21 0016 09/23/21 0549   09/23/21 0030  ceFEPIme (MAXIPIME) 2 g in sodium chloride 0.9 % 100 mL IVPB        2 g 200 mL/hr over 30 Minutes Intravenous  Once 09/23/21 0016 09/23/21 0158      Culture/Microbiology    Component Value Date/Time   SDES BLOOD RIGHT ANTECUBITAL 09/10/2021 0546   SPECREQUEST AEROBIC BOTTLE ONLY Blood Culture adequate volume 09/10/2021 0546   CULT  09/10/2021 0546    NO GROWTH 5 DAYS Performed at [facility], [place] [address] [place], [place] [address]    REPTSTATUS 09/15/2021 FINAL 09/10/2021 0546    Other culture-see note  Unresulted Labs (From admission, onward)     Start     Ordered   09/23/21 0341  Aerobic/Anaerobic Culture w Gram Stain (surgical/deep wound)  Once,   R        09/23/21 0340   09/23/21  0001  Blood culture (routine x 2)  BLOOD CULTURE X 2,   STAT      09/23/21 0001          Data Reviewed: I have personally reviewed following labs and imaging studies CBC: Recent Labs  Lab 09/22/21 2045 09/23/21 0605  WBC 8.7 9.2  NEUTROABS 5.9  --   HGB 9.8* 9.7*  HCT 30.9* 30.9*  MCV 89.8 92.0  PLT 314 448   Basic Metabolic Panel: Recent Labs  Lab 09/22/21 2045 09/23/21 0605  NA 129* 130*  K 4.6 4.6  CL 92* 94*  CO2 27 24  GLUCOSE 114* 109*  BUN 9 10  CREATININE 0.80 0.88  CALCIUM 8.7* 8.6*   GFR: Estimated Creatinine Clearance: 85.1 mL/min (by C-G formula based on SCr of 0.88 mg/dL). Liver Function Tests: Recent Labs  Lab 09/22/21 2045  AST 24  ALT 7  ALKPHOS 66  BILITOT 0.6  PROT 6.2*  ALBUMIN 2.0*   No results for input(s): LIPASE, AMYLASE in the last 168 hours. No results for input(s): AMMONIA in the last 168 hours. Coagulation Profile: No results for input(s): INR, PROTIME in the last 168 hours. Cardiac Enzymes: No results for input(s): CKTOTAL, CKMB, CKMBINDEX, TROPONINI in the last 168 hours. BNP (last 3 results) No results for input(s): PROBNP in the last 8760 hours. HbA1C: No results for input(s): HGBA1C in the last 72 hours. CBG: No results for input(s): GLUCAP in the last 168 hours. Lipid Profile: No results for input(s): CHOL, HDL, LDLCALC, TRIG, CHOLHDL, LDLDIRECT in the last 72 hours. Thyroid Function Tests: No results for input(s): TSH, T4TOTAL, FREET4, T3FREE, THYROIDAB in the last 72 hours. Anemia Panel: No results for input(s): VITAMINB12, FOLATE, FERRITIN, TIBC, IRON, RETICCTPCT in the last 72 hours. Sepsis Labs: No results for input(s): PROCALCITON, LATICACIDVEN in the last 168 hours.  Recent Results (from the past 240 hour(s))  Surgical pcr screen     Status: None   Collection Time: 09/23/21  8:38 PM   Specimen: Nasal Mucosa; Nasal Swab  Result Value Ref Range Status   MRSA, PCR NEGATIVE NEGATIVE Final   Staphylococcus  aureus NEGATIVE NEGATIVE Final    Comment: (NOTE) The Xpert SA Assay (FDA approved for NASAL specimens in patients 70 years of age and older), is one component of  a comprehensive surveillance program. It is not intended to diagnose infection nor to guide or monitor treatment. Performed at [facility], [place] [address], [place],  [address]      Radiology Studies: CT LUMBAR SPINE W CONTRAST  Result Date: 09/22/2021 CLINICAL DATA:  Low back pain, infection suspected, history of low back pain and abscess after laminectomy. EXAM: CT LUMBAR SPINE WITH CONTRAST TECHNIQUE: Multidetector CT imaging of the lumbar spine was performed with intravenous contrast administration. CONTRAST:  71mL OMNIPAQUE IOHEXOL 350 MG/ML SOLN COMPARISON:  09/08/2021 FINDINGS: Segmentation: 5 lumbar type vertebrae. Alignment: Levocurvature of the lumbar spine. Unchanged mild retrolisthesis L1 on L2. Vertebrae: No acute fracture. Status post L2-bi iliac fusion with ALIF at L5-S1, with no evidence of paraspinous lucency or hardware fracture. Paraspinal and other soft tissues: Peripherally enhancing fluid collection with foci of air in the subcutaneous soft tissues posterior to the lumbar spine, extending from the level of L2 to S3, measuring up to 7.9 x 5.7 x 15.6 cm (AP x TR x CC) (series 5, image 99 and series 9, image 54), previously 3.7 x 4.7 x 17.6 cm. The collection now extends into the paraspinous musculature and up to the spinal hardware from L4 through S2, most prominently at L5, where air and fluid are seen between the spinous process and left spinal rod. Additional subcutaneous edema extends from the thoracolumbar junction to the inferior sacrum. Focal skin defect posterior to L2 (series 5, image 52). Disc levels: Redemonstrated disc height loss and vacuum disc phenomenon at T11-T12, T12-L1, and L1-L2, without significant spinal canal stenosis. IMPRESSION: Interval increase in the size and complexity of a fluid  collection in the soft tissues posterior to the lumbosacral operative bed, with peripheral enhancement and internal foci of air, concerning for abscess with gas-forming organisms versus air entering from an open skin wound posterior to L2. The collection extends into the paraspinous musculature from L4 through S2, and, at the level of L5, it extends anteriorly to the space between the left spinal rod and spinous process. Electronically Signed   By: Merilyn Baba M.D.   On: 09/22/2021 23:56   MR LUMBAR SPINE WO CONTRAST  Result Date: 09/23/2021 CLINICAL DATA:  Low back pain, infection suspected EXAM: MRI LUMBAR SPINE WITHOUT CONTRAST TECHNIQUE: Multiplanar, multisequence MR imaging of the lumbar spine was performed. No intravenous contrast was administered. COMPARISON:  05/08/2014 lumbar spine MRI, correlation is also made with same day CT L-spine FINDINGS: Extremely limited, as the patient was not able to tolerate the exam. Only scouts and a sagittal T2 sequence was obtained, which is degraded by motion artifact. The fluid collection noted on the prior CT is actually better evaluated on the scout (series 7, images 5-12), where it measures up to 7.1 cm in AP dimension and 17 cm in craniocaudal dimension (series 7, image 10). On the axial scout images (series 8) the fluid collection can be seen extending through a laminectomy defect at the level of L5-S1, and approaching the posterior aspect of the thecal sac (series 8, image 3). The fluid collection can also be seen extending to the skin surface on this image. IMPRESSION: Limited exam due to the sequences obtained. Within this limitation, there is a large fluid collection in the soft tissues of the back, which likely extends to the posterior aspect of the thecal sac at the L5-S1 level. Electronically Signed   By: Merilyn Baba M.D.   On: 09/23/2021 03:04   MR Lumbar Spine W Wo  Contrast  Result Date: 09/23/2021 CLINICAL DATA:  Right leg weakness. History of  lumbar surgery with subsequent wound infection EXAM: MRI LUMBAR SPINE WITHOUT AND WITH CONTRAST TECHNIQUE: Multiplanar and multiecho pulse sequences of the lumbar spine were obtained without and with intravenous contrast. CONTRAST:  32mL GADAVIST GADOBUTROL 1 MMOL/ML IV SOLN COMPARISON:  CT 09/22/2021 FINDINGS: Technical note: Significantly limited exam secondary to patient motion artifact. Evaluation of the spinal canal is significantly degraded by motion artifact as well as metallic susceptibility artifact related to patient's fusion hardware. Segmentation:  Standard. Alignment: Lumbar levocurvature. No significant static listhesis is evident. Vertebrae: Susceptibility artifact related to L2-bi-iliac fusion with interbody spacers at the L2-3 through L5-S1 levels. Conus medullaris and cauda equina: Conus not definitively visualized. Cauda quadrant nerve roots are not able to be adequately assessed by artifact. Paraspinal and other soft tissues: Peripherally enhancing fluid collection within the soft tissues posterior to the lower lumbar spine measures approximately 11.1 x 3.3 cm (series 13, image 6). It is difficult to determine the anterior extent of the collection secondary to artifact. Disc levels: Evaluation of the disc levels is significantly degraded. There may be mild canal stenosis at T12-L1 and L1-2. Nondiagnostic assessment for abnormal fluid collections in or around the spinal canal. IMPRESSION: 1. Significantly limited exam secondary to patient motion artifact as well as metallic susceptibility artifact related to patient's fusion hardware. 2. Peripherally enhancing fluid collection within the soft tissues posterior to the lower lumbar spine measuring up to 11.1 x 3.3 cm. It is difficult to determine the anterior extent of the collection secondary to artifact. Collection appears decreased in size compared to the previous CT of 09/22/2021. 3. Suspected mild canal stenosis at T12-L1 and L1-2. 4. Evaluation  of the remaining spinal levels is significantly degraded. Electronically Signed   By: Davina Poke D.O.   On: 09/23/2021 16:11     LOS: 1 day   Antonieta Pert, MD [facility]  09/24/2021, 8:18 AM

## 2021-09-24 NOTE — Progress Notes (Signed)
Paged on call provider; pt requesting toradol IV since he is NPO and can only have morphine q 4.

## 2021-09-24 NOTE — Op Note (Signed)
   Providing Compassionate, Quality Care - Together  Date of service: 09/24/2021  PREOP DIAGNOSIS:  Lumbar wound infection L5-S1 nonunion  POSTOP DIAGNOSIS: Same  PROCEDURE: Incision and debridement of lumbar wound, placement of wound VAC  SURGEON: Dr. Kendell Bane C. Shota Kohrs, DO  ASSISTANT: Docia Barrier, NP  ANESTHESIA: General Endotracheal  EBL: 50 cc  SPECIMENS: Lumbar wound cultures  DRAINS: Medium wound VAC  COMPLICATIONS: None  CONDITION: Hemodynamically stable  HISTORY: Michael Hill is a 70 y.o. male with a history of an L5-S1 nonunion, status post L5-pelvis extension fusion for his nonunion by Dr. Venetia Maxon.  He presented back with MSSA bacteremia and wound infection that was debrided by Dr. Venetia Maxon on 09-06-2021.  He remained on a PICC line and IV antibiotics, however he returned with severe pain, increased inflammatory markers and continue wound drainage.  MRI revealed a ring-enhancing fluid collection in the subfascial and suprafascial spaces.  Given this, has increased inflammatory markers and continues wound drainage I recommended repeat incision and drainage with placement of wound VAC.  I discussed all risk, benefits and expected outcomes with the patient and his wife.  Informed consent was obtained.  PROCEDURE IN DETAIL: The patient was brought to the operating room. After induction of general anesthesia, the patient was positioned on the operative table in the prone position. All pressure points were meticulously padded. Skin incision was then marked out and prepped and draped in the usual sterile fashion.  Physician driven timeout was performed.  Using a 10 blade, previous incision was opened sharply.  The suprafascial, soft tissue space had purulence noted and a moderate amount of necrotic tissue.  This was cultured.  This was sharply debrided with 10 blade and suction and irrigation.  The fascia appeared intact however given the MRI findings I did feel it was appropriate  to open.  Using a 10 blade, the fascia was opened sharply.  The previous hardware was identified and there was some slight purulent fluid noted.  This was extensively irrigated and debrided.  Hemostasis was achieved with monopolar cautery.  There was normal, healthy tissue deep and superficial along the wound edges.  A appropriate sized wound VAC sponge was placed and dressed.  Continuous therapy was set and there was no wound leak noted.  At the end of the case all sponge, needle, and instrument counts were correct. The patient was then transferred to the stretcher, extubated, and taken to the post-anesthesia care unit in stable hemodynamic condition.

## 2021-09-24 NOTE — Anesthesia Procedure Notes (Signed)
Procedure Name: Intubation Date/Time: 09/24/2021 2:30 PM Performed by: Thelma Comp, CRNA Pre-anesthesia Checklist: Patient identified, Emergency Drugs available, Suction available and Patient being monitored Patient Re-evaluated:Patient Re-evaluated prior to induction Oxygen Delivery Method: Circle System Utilized Preoxygenation: Pre-oxygenation with 100% oxygen Induction Type: IV induction, Cricoid Pressure applied and Rapid sequence Ventilation: Mask ventilation without difficulty Laryngoscope Size: Mac and 4 Grade View: Grade I Tube type: Oral Tube size: 7.5 mm Number of attempts: 1 Airway Equipment and Method: Stylet and Oral airway Placement Confirmation: ETT inserted through vocal cords under direct vision, positive ETCO2 and breath sounds checked- equal and bilateral Secured at: 23 cm Tube secured with: Tape Dental Injury: Teeth and Oropharynx as per pre-operative assessment

## 2021-09-25 ENCOUNTER — Inpatient Hospital Stay (HOSPITAL_COMMUNITY): Payer: Medicare PPO

## 2021-09-25 DIAGNOSIS — T8149XA Infection following a procedure, other surgical site, initial encounter: Secondary | ICD-10-CM | POA: Diagnosis not present

## 2021-09-25 LAB — BASIC METABOLIC PANEL
Anion gap: 9 (ref 5–15)
BUN: 6 mg/dL — ABNORMAL LOW (ref 8–23)
CO2: 26 mmol/L (ref 22–32)
Calcium: 7.8 mg/dL — ABNORMAL LOW (ref 8.9–10.3)
Chloride: 100 mmol/L (ref 98–111)
Creatinine, Ser: 0.76 mg/dL (ref 0.61–1.24)
GFR, Estimated: >60 mL/min (ref >60)
Glucose, Bld: 192 mg/dL — ABNORMAL HIGH (ref 70–99)
Potassium: 3.5 mmol/L (ref 3.5–5.1)
Sodium: 135 mmol/L (ref 135–145)

## 2021-09-25 LAB — CBC
HCT: 29.6 % — ABNORMAL LOW (ref 39.0–52.0)
Hemoglobin: 9.4 g/dL — ABNORMAL LOW (ref 13.0–17.0)
MCH: 28.2 pg (ref 26.0–34.0)
MCHC: 31.8 g/dL (ref 30.0–36.0)
MCV: 88.9 fL (ref 80.0–100.0)
Platelets: 331 10*3/uL (ref 150–400)
RBC: 3.33 MIL/uL — ABNORMAL LOW (ref 4.22–5.81)
RDW: 13.8 % (ref 11.5–15.5)
WBC: 6.9 10*3/uL (ref 4.0–10.5)
nRBC: 0 % (ref 0.0–0.2)

## 2021-09-25 MED ORDER — OXYCODONE HCL ER 10 MG PO T12A
20.0000 mg | EXTENDED_RELEASE_TABLET | Freq: Two times a day (BID) | ORAL | Status: DC
  Administered 2021-09-25 – 2021-10-01 (×12): 20 mg via ORAL
  Filled 2021-09-25 (×12): qty 2

## 2021-09-25 MED ORDER — OXYCODONE HCL ER 10 MG PO T12A
20.0000 mg | EXTENDED_RELEASE_TABLET | Freq: Two times a day (BID) | ORAL | Status: DC
  Administered 2021-09-25: 20 mg via ORAL
  Filled 2021-09-25: qty 2

## 2021-09-25 NOTE — Evaluation (Signed)
Physical Therapy Evaluation Patient Details Name: Michael Hill MRN: [id number] DOB: [birth year] Today's Date: 09/25/2021  History of Present Illness  70 y/o male presented to ED on 10/19 for increasing back pain, R leg weakness, and worsening mobility. Initial L5-S1 decompression/fusion with extension to pelvis on 08/25/21. Patient with recent admission 10/5-10/11/22 for infection of recent surgical site s/p I&D on 10/5. MRI showed large fluid collection in soft tissues of the back which likely extends to posterior aspect of thecal sac at L5-S1. S/p lumbar wound I&D with wound VAC placement on 10/21. PMH: HTN, HLD, chronic back pain, GERD  Clinical Impression  PTA, patient lives with wife and was independent with RW prior to Tuesday 10/18. Patient presents with generalized weakness (R>L), impaired balance, decreased activity tolerance, and impaired functional mobility. Patient requires minA for bed mobility utilizing log roll technique, modA+2 for sit to stand transfer from elevated surface, and minA+2 for side steps towards HOB. HR max 165 during session, patient states this is normal for him. Patient motivated but with lack of confidence since recent setback. Patient able to recall 3/3 back precautions and maintain throughout. Patient will benefit from skilled PT services during acute stay to address listed deficits. Recommend CIR following discharge to assist with maximizing functional independence and return to PLOF since recent functional decline.        Recommendations for follow up therapy are one component of a multi-disciplinary discharge planning process, led by the attending physician.  Recommendations may be updated based on patient status, additional functional criteria and insurance authorization.  Follow Up Recommendations CIR    Equipment Recommendations  Rolling Naydeline Morace with 5" wheels    Recommendations for Other Services Rehab consult;OT consult     Precautions / Restrictions  Precautions Precautions: Fall;Back Precaution Booklet Issued: No Precaution Comments: Pt has handout from 2 weeks ago, able to recall 3/3 back precautions Required Braces or Orthoses: Spinal Brace Spinal Brace: Lumbar corset;Applied in sitting position Restrictions Weight Bearing Restrictions: No      Mobility  Bed Mobility Overal bed mobility: Needs Assistance Bed Mobility: Rolling;Sidelying to Sit Rolling: Min assist Sidelying to sit: Min assist       General bed mobility comments: minA to lift R LE into position to assist with rolling and trunk elevation    Transfers Overall transfer level: Needs assistance Equipment used: Rolling Shaurya Rawdon (2 wheeled) Transfers: Sit to/from Stand Sit to Stand: Mod assist;+2 physical assistance;+2 safety/equipment;From elevated surface         General transfer comment: increased time required due to fear/lack of confidence. ModA+2 to stand from elevated surface  Ambulation/Gait Ambulation/Gait assistance: Min assist;+2 physical assistance;+2 safety/equipment Gait Distance (Feet): 2 Feet Assistive device: Rolling Chelsea Nusz (2 wheeled) Gait Pattern/deviations: Step-to pattern Gait velocity: decreased   General Gait Details: sidesteps towards HOB with minA+2 for balance, line management, and RW management  Stairs            Wheelchair Mobility    Modified Rankin (Stroke Patients Only)       Balance Overall balance assessment: Needs assistance Sitting-balance support: No upper extremity supported;Feet supported Sitting balance-Leahy Scale: Good     Standing balance support: Bilateral upper extremity supported Standing balance-Leahy Scale: Poor Standing balance comment: reliant on UE support and external assist                             Pertinent Vitals/Pain Pain Assessment: Faces Faces Pain Scale: Hurts little more  Pain Location: back, R LE Pain Descriptors / Indicators: Operative site guarding;Sore Pain  Intervention(s): Monitored during session;Repositioned;Premedicated before session    Home Living Family/patient expects to be discharged to:: Private residence Living Arrangements: Spouse/significant other;Children Available Help at Discharge: Family;Available 24 hours/day Type of Home: House Home Access: Stairs to enter Entrance Stairs-Rails: Left Entrance Stairs-Number of Steps: 4-5 Home Layout: One level Home Equipment: Jovaun Levene - 2 wheels;Grab bars - tub/shower;Toilet riser;Adaptive equipment      Prior Function Level of Independence: Independent with assistive device(s)         Comments: up until Tuesday 10/18, patient independent. Recently requiring assist from wife due to severe R LE pain     Hand Dominance        Extremity/Trunk Assessment   Upper Extremity Assessment Upper Extremity Assessment: Overall WFL for tasks assessed (L hand soreness - awaiting x-ray results)    Lower Extremity Assessment Lower Extremity Assessment: Generalized weakness (R>L)    Cervical / Trunk Assessment Cervical / Trunk Assessment: Normal  Communication   Communication: No difficulties  Cognition Arousal/Alertness: Awake/alert Behavior During Therapy: WFL for tasks assessed/performed Overall Cognitive Status: Within Functional Limits for tasks assessed                                        General Comments      Exercises     Assessment/Plan    PT Assessment Patient needs continued PT services  PT Problem List Decreased strength;Decreased balance;Decreased activity tolerance;Decreased mobility;Decreased knowledge of use of DME;Decreased knowledge of precautions;Cardiopulmonary status limiting activity       PT Treatment Interventions DME instruction;Therapeutic activities;Gait training;Therapeutic exercise;Patient/family education;Balance training;Functional mobility training;Stair training    PT Goals (Current goals can be found in the Care Plan  section)  Acute Rehab PT Goals Patient Stated Goal: to get stronger and build confidence PT Goal Formulation: With patient/family Time For Goal Achievement: 10/09/21 Potential to Achieve Goals: Good    Frequency Min 5X/week   Barriers to discharge        Co-evaluation               AM-PAC PT "6 Clicks" Mobility  Outcome Measure Help needed turning from your back to your side while in a flat bed without using bedrails?: A Little Help needed moving from lying on your back to sitting on the side of a flat bed without using bedrails?: A Little Help needed moving to and from a bed to a chair (including a wheelchair)?: Total Help needed standing up from a chair using your arms (e.g., wheelchair or bedside chair)?: Total Help needed to walk in hospital room?: Total Help needed climbing 3-5 steps with a railing? : Total 6 Click Score: 10    End of Session Equipment Utilized During Treatment: Back brace;Gait belt Activity Tolerance: Patient tolerated treatment well Patient left: in bed;with call bell/phone within reach;with family/visitor present (sitting EOB with wife present) Nurse Communication: Mobility status PT Visit Diagnosis: Other abnormalities of gait and mobility (R26.89);Muscle weakness (generalized) (M62.81)    Time: 3875-6433 PT Time Calculation (min) (ACUTE ONLY): 32 min   Charges:   PT Evaluation $PT Eval Moderate Complexity: 1 Mod PT Treatments $Therapeutic Activity: 8-22 mins        Kennley Schwandt A. Dan Humphreys PT, DPT Acute Rehabilitation Services Pager [phone] Office [phone]   Viviann Spare 09/25/2021, 5:28 PM

## 2021-09-25 NOTE — Progress Notes (Signed)
PROGRESS NOTE    Michael Hill  UTM:[id number] DOB: Jul 15, 1951 DOA: 09/22/2021 PCP: Antony Contras, MD   Chief Complaint  Patient presents with   Post-op Problem  Brief Narrative/Hospital Course:  Michael Hill, 70 y.o. 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 Presents to the ED for worsening back pain, right leg weakness, and drainage.   In the ED slightly tachycardic afebrile blood pressure soft lab with normal WBC count hemoglobin 9.8 gram from 11.4 on 10/10, sodium 129, stable renal function ESR 126 CRP 21.4 UA without UTI blood culture x2 sent CT lumbar spine showing abscess with gas-forming organism versus air entering from an open wound posterior to L2,  collection extends into the paraspinous musculature from L4-S2 and at the level of L5 it extends anteriorly to the space between the left spinal rod and spinous process.  MRI (limited exam) showing a large fluid collection in the soft tissues of the back which likely extends to the posterior aspect of the thecal sac at the L5-S1 level.  Neurosurgery consulted. Patient was given Tylenol, Toradol, Ativan, morphine, vancomycin, cefepime, and 1 L normal saline bolus. Neurosurgery were consulted patient was admitted with broad-spectrum antibiotics 10/21-underwent lumbar wound I&D with wound VAC placement   Subjective: Seen this morning.  Leg pain much better able to move them sensation intact. Reports in between  going to surgery and coming back he is having pain in his left thenar palm area and difficulty with management of lt thumb Afebrile overnight, last night BP at times soft in high 90s.  Assessment & Plan:  Postop wound infection: From recent back surgery 9/21, and also recent I&D on 10/5.  MRI l spine-large fluid collection in the soft tissue of the back which likely extends to the posterior aspect of the thecal sac at the level of L5-S1.  Repeat MRI LS spine 10/20 limited due to motion but with showing enhancing fluid collection-see report.10/21-underwent lumbar wound I&D with wound VAC placement.  Appreciate neurosurgery input, repeat levels.  Continue current pain control with oxycodone 10 mg every 12 hours along with as needed IV Dilaudid and oxycodone/Tylenol.  Continue nafcillin as per ID and follow-up OR culture from 10/21  Recent MSSA severe sepsis discharged with PICC line and Ancef/rifampin recently.  Antibiotics as above  Anemia, acute on chronic likely from chronic disease, monitor hemoglobin.  Recent Labs  Lab 09/22/21 2045 09/23/21 0605 09/25/21 1005  HGB 9.8* 9.7* 9.4*  HCT 30.9* 30.9* 29.6*   Left thenar area redness/swelling of left thumb: ?  Etiology.  Intact ROM left forearm left shoulder and left elbow x-ray ordered, pain control supportive care  HTN BP soft, on low-dose metoprolol due to tachycardia.    Hyponatremia likely from poor oral intake, continue on IV fluids.  Repeat labs once Recent Labs  Lab 09/22/21 2045 09/23/21 0605 09/25/21 1005  NA 129* 130* 135    Poor oral intake since his recent surgery: Encourage intake.  Hyperlipidemia: cont statin  Deconditioning/debility: Symptomatic management PT OT eval once cleared by neurosurgery.  Class I Obesity:Patient's Body mass index is 31.32 kg/m. : Will benefit with PCP follow-up, weight loss  healthy lifestyle and outpatient sleep evaluation.  DVT prophylaxis: heparin injection 5,000 Units Start: 09/25/21  0600 SCDs Start: 09/23/21 0532 Code Status:   Code Status: Full Code Family Communication: plan of care discussed with patient at bedside. Status is: Inpatient  Remains inpatient appropriate because: For ongoing management of his postop wound infection   Objective: Vitals last 24 hrs: Vitals:   09/24/21 2147 09/25/21 0034 09/25/21 0347 09/25/21 0744  BP: 107/61 (!) 95/51 111/66   Pulse: (!) 101 100 90 92  Resp:  19 (!) 21   Temp:  98.4 F (36.9 C) 98.1 F (36.7 C) 97.6 F (36.4 C)  TempSrc:  Oral Oral Oral  SpO2:  95% 94%   Weight:      Height:       Weight change:   Intake/Output Summary (Last 24 hours) at 09/25/2021 1118 Last data filed at 09/25/2021 0500 Gross per 24 hour  Intake 2299.6 ml  Output 1870 ml  Net 429.6 ml   Net IO Since Admission: 2,225.23 mL [09/25/21 1118]   Physical Examination: General exam: AAOx 3, pleasant, not in distress.  HEENT:Oral mucosa moist, Ear/Nose WNL grossly, dentition normal. Respiratory system: bilaterally diminished, no use of accessory muscle Cardiovascular system: S1 & S2 +, No JVD,. Gastrointestinal system: Abdomen soft, NT,ND, BS+ Nervous System:Alert, awake, moving extremities and grossly nonfocal Extremities: edema and tenderness on the left  thenar and thumb area, intact ROM of left shoulder nontender left wrist  Skin: No rashes,no icterus. MSK: Normal muscle bulk,tone, power  Wound VAC on the back  Medications reviewed:  Scheduled Meds:  atorvastatin  20 mg Oral Daily   Chlorhexidine Gluconate Cloth  6 each Topical Daily   docusate sodium  100 mg Oral BID   famotidine  20 mg Oral Daily   heparin injection (subcutaneous)  5,000 Units Subcutaneous Q8H   metoprolol tartrate  12.5 mg Oral BID   oxyCODONE  20 mg Oral Q12H   Continuous Infusions:  dextrose 5 % and 0.9% NaCl 125 mL/hr at 09/25/21 0459   nafcillin (NAFCIL) continuous infusion 12 g (09/24/21 1900)   Diet Order             Diet regular Room service  appropriate? Yes; Fluid consistency: Thin  Diet effective now                          Weight change:   Wt Readings from Last 3 Encounters:  09/24/21 90.7 kg  09/08/21 97.6 kg  08/24/21 93 kg     Consultants:see note  Procedures:see note Antimicrobials: Anti-infectives (From admission, onward)    Start     Dose/Rate Route Frequency Ordered Stop   09/23/21 1900  nafcillin 12 g in sodium chloride 0.9 % 500 mL continuous infusion        12 g 20.8 mL/hr over 24 Hours Intravenous Every 24 hours 09/23/21 1429     09/23/21 1800  vancomycin (VANCOCIN) IVPB 1000 mg/200 mL premix  Status:  Discontinued        1,000 mg 200 mL/hr over 60 Minutes Intravenous Every 12 hours 09/23/21 0620 09/23/21 1119   09/23/21 1530  vancomycin (VANCOREADY) IVPB 750 mg/150 mL  Status:  Discontinued        750 mg 150 mL/hr over 60 Minutes Intravenous Every 12 hours 09/23/21 1119 09/23/21 1429   09/23/21 1000  ceFEPIme (MAXIPIME) 2 g in sodium chloride 0.9 % 100 mL IVPB  Status:  Discontinued        2 g 200 mL/hr over 30 Minutes Intravenous Every 8 hours 09/23/21 0620 09/23/21 1429   09/23/21 0030  vancomycin (VANCOREADY) IVPB 1500 mg/300 mL        1,500 mg 150 mL/hr over 120 Minutes Intravenous  Once 09/23/21 0016 09/23/21 0549   09/23/21 0030  ceFEPIme (MAXIPIME) 2 g in sodium chloride 0.9 % 100 mL IVPB        2 g 200 mL/hr over 30 Minutes Intravenous  Once 09/23/21 0016 09/23/21 0158      Culture/Microbiology    Component Value Date/Time   SDES WOUND 09/24/2021 [place] 09/24/2021 1450   CULT PENDING 09/24/2021 1450   REPTSTATUS PENDING 09/24/2021 1450    Other culture-see note  Unresulted Labs (From admission, onward)     Start     Ordered   09/26/21 7782  Basic metabolic panel  Daily,   R     Question:  Specimen collection method  Answer:  Unit=Unit collect   09/25/21 0820   09/26/21 0500  CBC  Daily,   R     Question:  Specimen collection method  Answer:   Unit=Unit collect   09/25/21 0820          Data Reviewed: I have personally reviewed following labs and imaging studies CBC: Recent Labs  Lab 09/22/21 2045 09/23/21 0605 09/25/21 1005  WBC 8.7 9.2 6.9  NEUTROABS 5.9  --   --   HGB 9.8* 9.7* 9.4*  HCT 30.9* 30.9* 29.6*  MCV 89.8 92.0 88.9  PLT 314 352 423   Basic Metabolic Panel: Recent Labs  Lab 09/22/21 2045 09/23/21 0605 09/25/21 1005  NA 129* 130* 135  K 4.6 4.6 3.5  CL 92* 94* 100  CO2 $Re'27 24 26  'XLU$ GLUCOSE 114* 109* 192*  BUN 9 10 6*  CREATININE 0.80 0.88 0.76  CALCIUM 8.7* 8.6* 7.8*   GFR: Estimated Creatinine Clearance: 93.6 mL/min (by C-G formula based on SCr of 0.76 mg/dL). Liver Function Tests: Recent Labs  Lab 09/22/21 2045  AST 24  ALT 7  ALKPHOS 66  BILITOT 0.6  PROT 6.2*  ALBUMIN 2.0*   No results for input(s): LIPASE, AMYLASE in the last 168 hours. No results for input(s): AMMONIA in the last 168 hours. Coagulation Profile: No results for input(s): INR, PROTIME in the last 168 hours. Cardiac Enzymes: No results for input(s): CKTOTAL, CKMB, CKMBINDEX, TROPONINI in the last 168 hours. BNP (last 3 results) No results for input(s): PROBNP in the last 8760 hours. HbA1C: No results for input(s): HGBA1C in the last 72 hours. CBG: No results for input(s): GLUCAP in the last 168 hours. Lipid Profile: No results for input(s): CHOL, HDL, LDLCALC, TRIG, CHOLHDL, LDLDIRECT in the last 72 hours. Thyroid Function Tests: No results for input(s): TSH, T4TOTAL, FREET4, T3FREE, THYROIDAB in the last 72 hours. Anemia Panel: No results for input(s): VITAMINB12, FOLATE, FERRITIN, TIBC, IRON, RETICCTPCT in the last 72 hours. Sepsis Labs: No results for input(s): PROCALCITON, LATICACIDVEN in the last 168 hours.  Recent Results (from the past 240 hour(s))  Blood culture (routine x 2)     Status: None (Preliminary result)   Collection Time: 09/23/21 12:53 AM   Specimen: BLOOD  Result Value Ref Range Status    Specimen Description BLOOD LEFT ANTECUBITAL  Final   Special Requests   Final  BOTTLES DRAWN AEROBIC AND ANAEROBIC Blood Culture adequate volume   Culture   Final    NO GROWTH 1 DAY Performed at [facility], [place] [address], [place], [place] [address]    Report Status PENDING  Incomplete  Blood culture (routine x 2)     Status: None (Preliminary result)   Collection Time: 09/23/21  1:06 AM   Specimen: BLOOD RIGHT HAND  Result Value Ref Range Status   Specimen Description BLOOD RIGHT HAND  Final   Special Requests   Final    BOTTLES DRAWN AEROBIC AND ANAEROBIC Blood Culture adequate volume   Culture   Final    NO GROWTH 1 DAY Performed at [facility], [place] [address], [place], [place] [address]    Report Status PENDING  Incomplete  Aerobic/Anaerobic Culture w Gram Stain (surgical/deep wound)     Status: None (Preliminary result)   Collection Time: 09/23/21  3:41 AM   Specimen: Wound  Result Value Ref Range Status   Specimen Description WOUND  Final   Special Requests NONE  Final   Gram Stain PENDING  Incomplete   Culture   Final    NO GROWTH 1 DAY Performed at [facility], [address] [place], [place] [address]    Report Status PENDING  Incomplete  Surgical pcr screen     Status: None   Collection Time: 09/23/21  8:38 PM   Specimen: Nasal Mucosa; Nasal Swab  Result Value Ref Range Status   MRSA, PCR NEGATIVE NEGATIVE Final   Staphylococcus aureus NEGATIVE NEGATIVE Final    Comment: (NOTE) The Xpert SA Assay (FDA approved for NASAL specimens in patients 62 years of age and older), is one component of a comprehensive surveillance program. It is not intended to diagnose infection nor to guide or monitor treatment. Performed at [facility], [place] [address] [place], [place] [address]   Aerobic/Anaerobic Culture w Gram Stain (surgical/deep wound)     Status: None (Preliminary result)   Collection Time: 09/24/21  2:50 PM   Specimen: Wound   Result Value Ref Range Status   Specimen Description WOUND  Final   Special Requests LUMBAR  Final   Gram Stain   Final    MODERATE WBC PRESENT, PREDOMINANTLY PMN NO ORGANISMS SEEN Performed at [facility], [place] [address] [place], [place] [address]    Culture PENDING  Incomplete   Report Status PENDING  Incomplete     Radiology Studies: MR Lumbar Spine W Wo Contrast  Result Date: 09/23/2021 CLINICAL DATA:  Right leg weakness. History of lumbar surgery with subsequent wound infection EXAM: MRI LUMBAR SPINE WITHOUT AND WITH CONTRAST TECHNIQUE: Multiplanar and multiecho pulse sequences of the lumbar spine were obtained without and with intravenous contrast. CONTRAST:  83mL GADAVIST GADOBUTROL 1 MMOL/ML IV SOLN COMPARISON:  CT 09/22/2021 FINDINGS: Technical note: Significantly limited exam secondary to patient motion artifact. Evaluation of the spinal canal is significantly degraded by motion artifact as well as metallic susceptibility artifact related to patient's fusion hardware. Segmentation:  Standard. Alignment: Lumbar levocurvature. No significant static listhesis is evident. Vertebrae: Susceptibility artifact related to L2-bi-iliac fusion with interbody spacers at the L2-3 through L5-S1 levels. Conus medullaris and cauda equina: Conus not definitively visualized. Cauda quadrant nerve roots are not able to be adequately assessed by artifact. Paraspinal and other soft tissues: Peripherally enhancing fluid collection within the soft tissues posterior to the lower lumbar spine measures approximately 11.1 x 3.3 cm (  series 13, image 6). It is difficult to determine the anterior extent of the collection secondary to artifact. Disc levels: Evaluation of the disc levels is significantly degraded. There may be mild canal stenosis at T12-L1 and L1-2. Nondiagnostic assessment for abnormal fluid collections in or around the spinal canal. IMPRESSION: 1. Significantly limited exam secondary to patient  motion artifact as well as metallic susceptibility artifact related to patient's fusion hardware. 2. Peripherally enhancing fluid collection within the soft tissues posterior to the lower lumbar spine measuring up to 11.1 x 3.3 cm. It is difficult to determine the anterior extent of the collection secondary to artifact. Collection appears decreased in size compared to the previous CT of 09/22/2021. 3. Suspected mild canal stenosis at T12-L1 and L1-2. 4. Evaluation of the remaining spinal levels is significantly degraded. Electronically Signed   By: Davina Poke D.O.   On: 09/23/2021 16:11     LOS: 2 days   Antonieta Pert, MD [facility]  09/25/2021, 11:18 AM

## 2021-09-25 NOTE — Anesthesia Postprocedure Evaluation (Signed)
Anesthesia Post Note  Patient: Michael Hill  Procedure(s) Performed: Lumbar Wound Irrigation and Debridement. (Spine Lumbar) APPLICATION OF WOUND VAC     Patient location during evaluation: PACU Anesthesia Type: General Level of consciousness: awake and alert Pain management: pain level controlled Vital Signs Assessment: post-procedure vital signs reviewed and stable Respiratory status: spontaneous breathing, nonlabored ventilation, respiratory function stable and patient connected to nasal cannula oxygen Cardiovascular status: blood pressure returned to baseline and stable Postop Assessment: no apparent nausea or vomiting Anesthetic complications: no   No notable events documented.  Last Vitals:  Vitals:   09/25/21 0347 09/25/21 0744  BP: 111/66   Pulse: 90 92  Resp: (!) 21   Temp: 36.7 C 36.4 C  SpO2: 94%     Last Pain:  Vitals:   09/25/21 0900  TempSrc:   PainSc: 3                  Kennieth Rad

## 2021-09-25 NOTE — Progress Notes (Signed)
Patient ID: Michael Hill, male   DOB: 17-Mar-1951, 70 y.o.   MRN: [id number] Vital signs reveal patient has a tachycardia of 110-120.  With some agitation it goes 140 and 150.  Blood pressure is maintained Patient did describe his generalized frustration with his illness and the fact that he he is feeling quite debilitated having had this infection and now a second debridement.  He notes that his pain control has been fairly poor.  He is a longstanding user of opioids and notes that he is having considerably more pain than he feels he should be having at this time.  Also he notes that his left hand is significantly swollen and he cannot use the hand to grip since the surgery.  He is not certain what happened to it but he notes that its been this way since the surgery.  On exam the wound VAC appears to be intact but I do agree that his left hand has significant swelling in the thenar and hypothenar eminence across the wrist.  It is painful to palpate and move about.  I will obtain an x-ray of the left wrist today.  I discussed his pain management with his nurse Byrd Hesselbach and noted that I will increase the OxyContin to 20 mg twice a day.  This should lessen his dependence on the as needed doses for the breakthrough pain.  Beyond that we will have physical therapy to evaluate him regarding his mobility particularly in regards of his inability to use left upper extremity at this time.

## 2021-09-26 ENCOUNTER — Encounter (HOSPITAL_COMMUNITY): Payer: Self-pay | Admitting: Neurological Surgery

## 2021-09-26 DIAGNOSIS — E871 Hypo-osmolality and hyponatremia: Secondary | ICD-10-CM | POA: Diagnosis not present

## 2021-09-26 DIAGNOSIS — M19042 Primary osteoarthritis, left hand: Secondary | ICD-10-CM

## 2021-09-26 DIAGNOSIS — R000 Tachycardia, unspecified: Secondary | ICD-10-CM | POA: Diagnosis not present

## 2021-09-26 DIAGNOSIS — D649 Anemia, unspecified: Secondary | ICD-10-CM | POA: Diagnosis not present

## 2021-09-26 DIAGNOSIS — T8149XA Infection following a procedure, other surgical site, initial encounter: Secondary | ICD-10-CM | POA: Diagnosis not present

## 2021-09-26 LAB — CBC
HCT: 26.6 % — ABNORMAL LOW (ref 39.0–52.0)
Hemoglobin: 8.5 g/dL — ABNORMAL LOW (ref 13.0–17.0)
MCH: 28 pg (ref 26.0–34.0)
MCHC: 32 g/dL (ref 30.0–36.0)
MCV: 87.5 fL (ref 80.0–100.0)
Platelets: 341 10*3/uL (ref 150–400)
RBC: 3.04 MIL/uL — ABNORMAL LOW (ref 4.22–5.81)
RDW: 13.8 % (ref 11.5–15.5)
WBC: 6.1 10*3/uL (ref 4.0–10.5)
nRBC: 0 % (ref 0.0–0.2)

## 2021-09-26 LAB — BASIC METABOLIC PANEL
Anion gap: 7 (ref 5–15)
BUN: 5 mg/dL — ABNORMAL LOW (ref 8–23)
CO2: 25 mmol/L (ref 22–32)
Calcium: 7.5 mg/dL — ABNORMAL LOW (ref 8.9–10.3)
Chloride: 101 mmol/L (ref 98–111)
Creatinine, Ser: 0.66 mg/dL (ref 0.61–1.24)
GFR, Estimated: >60 mL/min (ref >60)
Glucose, Bld: 139 mg/dL — ABNORMAL HIGH (ref 70–99)
Potassium: 3.6 mmol/L (ref 3.5–5.1)
Sodium: 133 mmol/L — ABNORMAL LOW (ref 135–145)

## 2021-09-26 LAB — URIC ACID: Uric Acid, Serum: 3.1 mg/dL — ABNORMAL LOW (ref 3.7–8.6)

## 2021-09-26 MED ORDER — SODIUM CHLORIDE 0.9 % IV SOLN: INTRAVENOUS | Status: AC

## 2021-09-26 MED ORDER — METOPROLOL TARTRATE 25 MG PO TABS
25.0000 mg | ORAL_TABLET | Freq: Two times a day (BID) | ORAL | Status: DC
  Administered 2021-09-26 – 2021-09-27 (×3): 25 mg via ORAL
  Filled 2021-09-26 (×2): qty 1

## 2021-09-26 MED ORDER — METOPROLOL TARTRATE 5 MG/5ML IV SOLN: 2.5000 mg | Freq: Four times a day (QID) | INTRAVENOUS | Status: DC | PRN

## 2021-09-26 MED ORDER — INDOMETHACIN 25 MG PO CAPS
25.0000 mg | ORAL_CAPSULE | Freq: Two times a day (BID) | ORAL | Status: AC
  Administered 2021-09-27 – 2021-09-29 (×6): 25 mg via ORAL
  Filled 2021-09-26 (×6): qty 1

## 2021-09-26 NOTE — Evaluation (Signed)
Occupational Therapy Evaluation Patient Details Name: Michael Hill MRN: [id number] DOB: 02/10/1951 Today's Date: 09/26/2021   History of Present Illness 70 y/o male presented to ED on 10/19 for increasing back pain, R leg weakness, and worsening mobility. Initial L5-S1 decompression/fusion with extension to pelvis on 08/25/21. Patient with recent admission 10/5-10/11/22 for infection of recent surgical site s/p I&D on 10/5. MRI showed large fluid collection in soft tissues of the back which likely extends to posterior aspect of thecal sac at L5-S1. S/p lumbar wound I&D with wound VAC placement on 10/21. PMH: HTN, HLD, chronic back pain, GERD   Clinical Impression   Pt presents with decreased balance, strength, activity tolerance, and pain. Pt also with recent onset of edema and pain in L hand that is limiting ROM and functional use, x-ray taken and awaiting results. Pt currently requiring Max A with LB ADLs. Pt independent/highly active at baseline and motivated to participate and regain independence, however frustrated and disheartened by current state. Pt would likely benefit from CIR to improve safety/independence with ADLs and functional transfers/mobility prior to return home. Will follow acutely.  During eval, pt sat EOB and was motivated to attempt stand pivot transfer to chair. While sitting EOB, pt reported that he began to feel light-headed and became tachycardic with HR reaching up to 220 bpm. Pt assisted back to supine and and HR lowered back down to 118 bpm. Nsg staff in room to perform EKG and reported pt symptoms to MD.     Recommendations for follow up therapy are one component of a multi-disciplinary discharge planning process, led by the attending physician.  Recommendations may be updated based on patient status, additional functional criteria and insurance authorization.   Follow Up Recommendations  CIR    Equipment Recommendations  Other (comment) (TBD)     Recommendations for Other Services       Precautions / Restrictions Precautions Precautions: Fall;Back Precaution Booklet Issued: No Precaution Comments: Pt has handout from 2 weeks ago, able to recall 3/3 back precautions Required Braces or Orthoses: Spinal Brace Spinal Brace: Lumbar corset;Applied in sitting position Restrictions Weight Bearing Restrictions: No      Mobility Bed Mobility Overal bed mobility: Needs Assistance Bed Mobility: Rolling;Sidelying to Sit;Sit to Sidelying Rolling: Min assist Sidelying to sit: Mod assist            Transfers                      Balance Overall balance assessment: Needs assistance Sitting-balance support: No upper extremity supported;Feet supported Sitting balance-Leahy Scale: Good                                     ADL either performed or assessed with clinical judgement   ADL Overall ADL's : Needs assistance/impaired Eating/Feeding: Independent;Sitting   Grooming: Minimal assistance;Sitting   Upper Body Bathing: Minimal assistance;Sitting   Lower Body Bathing: Maximal assistance   Upper Body Dressing : Set up;Cueing for UE precautions   Lower Body Dressing: Maximal assistance                 General ADL Comments: Pt limited by pain, activity tolerance, balance, and weakness.     Vision   Vision Assessment?: No apparent visual deficits     Perception     Praxis      Pertinent Vitals/Pain Pain Assessment: Faces Faces Pain Scale: Hurts  even more Pain Location: back, R LE, LUE Pain Descriptors / Indicators: Operative site guarding;Sore Pain Intervention(s): Limited activity within patient's tolerance;Monitored during session;Repositioned     Hand Dominance Right   Extremity/Trunk Assessment Upper Extremity Assessment Upper Extremity Assessment: LUE deficits/detail LUE Deficits / Details: Pt with noted edema and pain in L hand CMC area that is restricting ROM and  functional use. Pt reported that this is recent and he is unsure what caused it. Pt had x-ray yesterday and awaiting results.   Lower Extremity Assessment Lower Extremity Assessment: Defer to PT evaluation   Cervical / Trunk Assessment Cervical / Trunk Assessment: Other exceptions Cervical / Trunk Exceptions: s/p back surgery 08/25/21   Communication Communication Communication: No difficulties   Cognition Arousal/Alertness: Awake/alert Behavior During Therapy: [facility] for tasks assessed/performed Overall Cognitive Status: Within Functional Limits for tasks assessed                                     General Comments  Pt sat EOB and was motivated to attempt stand pivot transfer to chair. While sitting EOB, pt reported that he began to feel light-headed and became tachycardic with HR reaching up to 220 bpm. Pt assisted back to supine and and HR lowered back down to 118 bpm. Nsg staff in room to perform EKG and reported pt symptoms to MD.    Exercises     Shoulder Instructions      Home Living Family/patient expects to be discharged to:: Private residence Living Arrangements: Spouse/significant other;Children Available Help at Discharge: Family;Available 24 hours/day Type of Home: House Home Access: Stairs to enter [employer] of Steps: 4-5 Entrance Stairs-Rails: Left Home Layout: One level     Bathroom Shower/Tub: Walk-in shower;Tub/shower unit   Bathroom Toilet: Standard     Home Equipment: Walker - 2 wheels;Grab bars - tub/shower;Toilet riser;Adaptive equipment Adaptive Equipment: Reacher        Prior Functioning/Environment Level of Independence: Independent with assistive device(s)        Comments: up until Tuesday 10/18, patient independent. Recently requiring assist from wife due to severe R LE pain        OT Problem List: Decreased activity tolerance;Impaired balance (sitting and/or standing);Decreased safety awareness;Decreased  knowledge of use of DME or AE;Decreased knowledge of precautions;Cardiopulmonary status limiting activity;Impaired UE functional use;Pain;Increased edema      OT Treatment/Interventions: Self-care/ADL training;Therapeutic exercise;Neuromuscular education;Energy conservation;DME and/or AE instruction;Therapeutic activities;Patient/family education;Balance training    OT Goals(Current goals can be found in the care plan section) Acute Rehab OT Goals Patient Stated Goal: to get stronger and build confidence OT Goal Formulation: With patient Time For Goal Achievement: 10/10/21 Potential to Achieve Goals: Good  OT Frequency: Min 2X/week   Barriers to D/C:            Co-evaluation              AM-PAC OT "6 Clicks" Daily Activity     Outcome Measure Help from another person eating meals?: None Help from another person taking care of personal grooming?: A Little Help from another person toileting, which includes using toliet, bedpan, or urinal?: A Lot Help from another person bathing (including washing, rinsing, drying)?: A Lot Help from another person to put on and taking off regular upper body clothing?: A Little Help from another person to put on and taking off regular lower body clothing?: A Lot 6 Click Score: 16  End of Session Equipment Utilized During Treatment: Back brace Nurse Communication: Mobility status  Activity Tolerance: Patient limited by fatigue;Patient limited by pain Patient left: in bed;with call bell/phone within reach;with nursing/sitter in room  OT Visit Diagnosis: Unsteadiness on feet (R26.81);Muscle weakness (generalized) (M62.81);Pain                Time: 6063-0160 OT Time Calculation (min): 59 min Charges:  OT General Charges $OT Visit: 1 Visit OT Evaluation $OT Eval Moderate Complexity: 1 Mod OT Treatments $Self Care/Home Management : 8-22 mins $Therapeutic Activity: 23-37 mins  Leonie Amacher C, OT/L  Acute Rehab [phone]  Lenice Llamas 09/26/2021, 9:29 AM

## 2021-09-26 NOTE — Progress Notes (Signed)
Patient's HR noted in the 200s as PT attyempt to get him OOB. He aslso c/o dizziness with this elevated HR. He shared that he has never has this happened before. MD notified. Orders received. EKG at bedside. Patient back to bed with HR in the low 120s. Will continue to monitor.  Hav, RN.

## 2021-09-26 NOTE — Progress Notes (Signed)
Subjective: Patient reports pain in his right hand  Objective: Vital signs in last 24 hours: Temp:  [98.4 F (36.9 C)-99.9 F (37.7 C)] 98.5 F (36.9 C) (10/23 0751) Pulse Rate:  [86-105] 86 (10/23 1000) Resp:  [16-22] 18 (10/23 1000) BP: (97-134)/(56-89) 126/68 (10/23 0751) SpO2:  [90 %-95 %] 94 % (10/23 1000)  Intake/Output from previous day: 10/22 0701 - 10/23 0700 In: 2458.8 [P.O.:340; I.V.:1687.5; IV Piggyback:431.3] Out: 650 [Urine:450; Drains:200] Intake/Output this shift: Total I/O In: 2178.8 [P.O.:360; I.V.:1818.8] Out: 400 [Urine:400] NAD Awake, alert Wound vac in place   Lab Results: Recent Labs    09/25/21 1005 09/26/21 0217  WBC 6.9 6.1  HGB 9.4* 8.5*  HCT 29.6* 26.6*  PLT 331 341   BMET Recent Labs    09/25/21 1005 09/26/21 0217  NA 135 133*  K 3.5 3.6  CL 100 101  CO2 26 25  GLUCOSE 192* 139*  BUN 6* 5*  CREATININE 0.76 0.66  CALCIUM 7.8* 7.5*    Studies/Results: DG Hand 2 View Left  Result Date: 09/25/2021 CLINICAL DATA:  Pain of the left hand and forearm. EXAM: LEFT HAND - 2 VIEW COMPARISON:  None. FINDINGS: Mild joint space loss at the radiocarpal joint. Chondrocalcinosis of the triangular fibrocartilage. Advanced degenerative change at the first carpometacarpal articulation with joint space narrowing, osteophyte formation and possible intra-articular loose bodies. This would quite likely be painful. 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 Inter phalangeal joints appear unremarkable. IMPRESSION: Advanced degenerative change at the first carpometacarpal articulation, which would likely be painful. Lesser degenerative changes at the MCP joints of the thumb, index and long fingers and of the radiocarpal joint. Electronically Signed   By: Paulina Fusi M.D.   On: 09/25/2021 12:00    Assessment/Plan: S/p lumbar wound washout and wound vac - monitor Hgb - his severe arthritis is  likely responsible for his hand pain, but will order an UE doppler as well - cont atbs   [name] 09/26/2021, 11:34 AM

## 2021-09-26 NOTE — Progress Notes (Signed)
Inpatient Rehab Admissions Coordinator Note:   Per PT/OT patient was screened for CIR candidacy by Analeese Andreatta Luvenia Starch, CCC-SLP. At this time, pt appears to be a potential candidate for CIR. I will place an order for rehab consult for full assessment, per our protocol.  Please contact me any with questions.Wolfgang Phoenix, MS, CCC-SLP Admissions Coordinator [phone] 09/26/21 5:09 PM

## 2021-09-26 NOTE — Progress Notes (Signed)
PT Cancellation Note  Patient Details Name: LOGUN COLAVITO MRN: [id number] DOB: 03-17-1951   Cancelled Treatment:    Reason Eval/Treat Not Completed: Medical issues which prohibited therapy;Fatigue/lethargy limiting ability to participate. Pt reports fatigue, declining PT at this time, returning to bed. Pt also with HR reading up to 175, in Afib, while sitting at edge of bed when PT in room. PT will attempt to follow up as time allows.   Arlyss Gandy 09/26/2021, 2:50 PM

## 2021-09-26 NOTE — Progress Notes (Addendum)
PROGRESS NOTE    Michael Hill  KGU:[id number]  DOB: [birth year]  PCP: Antony Contras, MD Admit date:09/22/2021 Chief compliant: worsening back pain , rt leg weakness   70 y.o. 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 Now, presents to the ED for worsening back pain, right leg weakness, and drainage.  ED Course: Afebrile, slightly tachycardic, BP soft ,lab with normal WBC count hemoglobin 9.8 gram from 11.4 on 10/10, sodium 129, stable renal function ESR 126 CRP 21.4.  CT lumbar spine showing abscess with gas-forming organism versus air entering from an open wound posterior to L2,  collection extends into the paraspinous musculature from L4-S2 and at the level of L5 it extends anteriorly to the space between the left spinal rod and spinous process.  MRI (limited exam) showing a large fluid collection in the soft tissues of the back which likely extends to the posterior aspect of the thecal sac at the L5-S1 level.  Blood culture x2 sent,Neurosurgery consulted. Patient was given Tylenol, Toradol, Ativan, morphine, vancomycin, cefepime, and 1 L normal saline bolus. Hospital course: Neurosurgery were consulted patient was admitted with broad-spectrum antibiotics 10/21-underwent lumbar wound I&D with wound VAC placement.   Continue nafcillin as per ID and follow-up OR culture  10/22:Left thenar area redness/swelling of left thumb-x ray ordered. On IVF for  hyponatremia  Subjective:  Patient sitting comfortably in the bed and eating.  RN this morning reported that patient's heart rate 200s with any movement.  Beta-blocker dose adjusted.  Mostly raises concerns about pain med schedule.  Heart rate appears improved this evening.  Wife at bedside.  Objective: Vitals:   09/25/21 2154 09/25/21 2324 09/26/21 0311 09/26/21 0751  BP:  126/83 134/81 126/68  Pulse: (!) 105 92 94 97  Resp:  20 (!) 22 18  Temp:  99.9 F (37.7 C) 98.7 F (37.1 C) 98.5 F (36.9 C)  TempSrc:  Oral Oral Oral  SpO2:  93% 94% 93%  Weight:      Height:        Intake/Output Summary (Last 24 hours) at 09/26/2021 0840 Last data filed at 09/26/2021 0752 Gross per 24 hour  Intake 2458.79 ml  Output 850 ml  Net 1608.79 ml   Filed Weights   09/22/21 1934 09/24/21 1323  Weight: 90.7 kg 90.7 kg    Physical Examination:  General: Moderately built, no acute distress noted Head ENT: Atraumatic normocephalic, PERRLA, neck supple Heart: S1-S2 heard, tachycardic, no murmurs.  No leg edema noted Lungs: Equal air entry bilaterally, no rhonchi or rales on exam, no accessory muscle use Abdomen: Bowel sounds heard, soft, nontender, nondistended. No organomegaly.  No CVA tenderness Extremities: Splint at noted along left hand.  No pedal edema.  No cyanosis or clubbing. Neurological: Awake alert oriented x3, no focal weakness or numbness, strength and sensations to crude touch intact Skin: No wounds or rashes.  S/p lumbar wound washout and wound VAC in place    Data Reviewed: I have personally reviewed following labs and imaging studies  CBC: Recent Labs  Lab  09/22/21 2045 09/23/21 0605 09/25/21 1005 09/26/21 0217  WBC 8.7 9.2 6.9 6.1  NEUTROABS 5.9  --   --   --   HGB 9.8* 9.7* 9.4* 8.5*  HCT 30.9* 30.9* 29.6* 26.6*  MCV 89.8 92.0 88.9 87.5  PLT 314 352 331 096   Basic Metabolic Panel: Recent Labs  Lab 09/22/21 2045 09/23/21 0605 09/25/21 1005 09/26/21 0217   NA 129* 130* 135 133*  K 4.6 4.6 3.5 3.6  CL 92* 94* 100 101  CO2 $Re'27 24 26 25  'miI$ GLUCOSE 114* 109* 192* 139*  BUN 9 10 6* 5*  CREATININE 0.80 0.88 0.76 0.66  CALCIUM 8.7* 8.6* 7.8* 7.5*   GFR: Estimated Creatinine Clearance: 93.6 mL/min (by C-G formula based on SCr of 0.66 mg/dL). Liver Function Tests: Recent Labs  Lab 09/22/21 2045  AST 24  ALT 7  ALKPHOS 66  BILITOT 0.6  PROT 6.2*  ALBUMIN 2.0*   No results for input(s): LIPASE, AMYLASE in the last 168 hours. No results for input(s): AMMONIA in the last 168 hours. Coagulation Profile: No results for input(s): INR, PROTIME in the last 168 hours. Cardiac Enzymes: No results for input(s): CKTOTAL, CKMB, CKMBINDEX, TROPONINI in the last 168 hours. BNP (last 3 results) No results for input(s): PROBNP in the last 8760 hours. HbA1C: No results for input(s): HGBA1C in the last 72 hours. CBG: No results for input(s): GLUCAP in the last 168 hours. Lipid Profile: No results for input(s): CHOL, HDL, LDLCALC, TRIG, CHOLHDL, LDLDIRECT in the last 72 hours. Thyroid Function Tests: No results for input(s): TSH, T4TOTAL, FREET4, T3FREE, THYROIDAB in the last 72 hours. Anemia Panel: No results for input(s): VITAMINB12, FOLATE, FERRITIN, TIBC, IRON, RETICCTPCT in the last 72 hours. Sepsis Labs: No results for input(s): PROCALCITON, LATICACIDVEN in the last 168 hours.  Recent Results (from the past 240 hour(s))  Blood culture (routine x 2)     Status: None (Preliminary result)   Collection Time: 09/23/21 12:53 AM   Specimen: BLOOD  Result Value Ref Range Status   Specimen Description BLOOD LEFT ANTECUBITAL  Final   Special Requests   Final    BOTTLES DRAWN AEROBIC AND ANAEROBIC Blood Culture adequate volume   Culture   Final    NO GROWTH 2 DAYS Performed at [facility], [address], [place], [place] [address]    Report Status PENDING  Incomplete  Blood culture (routine x 2)     Status: None (Preliminary result)    Collection Time: 09/23/21  1:06 AM   Specimen: BLOOD RIGHT HAND  Result Value Ref Range Status   Specimen Description BLOOD RIGHT HAND  Final   Special Requests   Final    BOTTLES DRAWN AEROBIC AND ANAEROBIC Blood Culture adequate volume   Culture   Final    NO GROWTH 2 DAYS Performed at [facility], [place] [address] [place], [place] [address]    Report Status PENDING  Incomplete  Aerobic/Anaerobic Culture w Gram Stain (surgical/deep wound)     Status: None (Preliminary result)   Collection Time: 09/23/21  3:41 AM   Specimen: Wound  Result Value Ref Range Status   Specimen Description WOUND  Final   Special Requests NONE  Final   Gram Stain NO WBC SEEN NO ORGANISMS SEEN   Final   Culture   Final    NO GROWTH 2 DAYS NO ANAEROBES ISOLATED; CULTURE IN PROGRESS FOR 5 DAYS Performed at [facility], [place]  [address] [place], [place] [address]    Report Status PENDING  Incomplete  Surgical pcr screen     Status: None   Collection Time: 09/23/21  8:38 PM   Specimen: Nasal Mucosa; Nasal Swab  Result Value Ref Range Status   MRSA, PCR NEGATIVE NEGATIVE Final   Staphylococcus aureus NEGATIVE NEGATIVE Final    Comment: (NOTE) The Xpert SA Assay (FDA approved for NASAL specimens in patients 58 years of age and older), is one component of a comprehensive surveillance program. It is not intended to diagnose infection nor to guide or monitor treatment. Performed at [facility], [place] [address] [place], [place] [address]   Aerobic/Anaerobic Culture w Gram Stain (surgical/deep wound)     Status: None (Preliminary result)   Collection Time: 09/24/21  2:50 PM   Specimen: Wound  Result Value Ref Range Status   Specimen Description WOUND  Final   Special Requests LUMBAR  Final   Gram Stain   Final    MODERATE WBC PRESENT, PREDOMINANTLY PMN NO ORGANISMS SEEN    Culture   Final    NO GROWTH < 24 HOURS Performed at [facility], [place] [address], [place], [place]  [address]    Report Status PENDING  Incomplete      Radiology Studies: DG Hand 2 View Left  Result Date: 09/25/2021 CLINICAL DATA:  Pain of the left hand and forearm. EXAM: LEFT HAND - 2 VIEW COMPARISON:  None. FINDINGS: Mild joint space loss at the radiocarpal joint. Chondrocalcinosis of the triangular fibrocartilage. Advanced degenerative change at the first carpometacarpal articulation with joint space narrowing, osteophyte formation and possible intra-articular loose bodies. This would quite likely be painful. 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 Inter phalangeal joints appear unremarkable. IMPRESSION: Advanced degenerative change at the first carpometacarpal articulation, which would likely be painful. Lesser degenerative changes at the MCP joints of the thumb, index and long fingers and of the radiocarpal joint. Electronically Signed   By: Nelson Chimes M.D.   On: 09/25/2021 12:00      Scheduled Meds:  atorvastatin  20 mg Oral Daily   Chlorhexidine Gluconate Cloth  6 each Topical Daily   docusate sodium  100 mg Oral BID   famotidine  20 mg Oral Daily   heparin injection (subcutaneous)  5,000 Units Subcutaneous Q8H   metoprolol tartrate  12.5 mg Oral BID   oxyCODONE  20 mg Oral Q12H   Continuous Infusions:  dextrose 5 % and 0.9% NaCl 125 mL/hr at 09/26/21 0551   nafcillin (NAFCIL) continuous infusion 12 g (09/25/21 1905)      Assessment/Plan:  Postop wound infection: From recent back surgery 9/21, and also recent I&D on 10/5.  MRI l spine-large fluid collection in the soft tissue of the back which likely extends to the posterior aspect of the thecal sac at the level of L5-S1.  Repeat MRI LS spine 10/20 limited due to motion but with showing enhancing fluid collection-see report.10/21-underwent lumbar wound I&D with wound VAC placement.  Appreciate neurosurgery input, repeat levels.  Continue current pain control with  oxycodone 10 mg every 12 hours along with as needed IV Dilaudid and oxycodone/Tylenol.  Continue nafcillin as per ID and follow-up OR culture from 10/21   Recent MSSA severe sepsis discharged with PICC line and Ancef/rifampin recently.  Antibiotics as above  Tachycardia: Sinus tachycardia versus a flutter/fib.  EKG done this morning  read as a flutter but appears to be sinus tach in some leads.  Increased metoprolol dosage and also have as needed meds available.  QTc 370 ms.  Continue to monitor and repeat EKG if heart rate goes above 150.  Check TSH.   Hand pain : X ray shows: Advanced degenerative change at the first carpometacarpal articulation, which would likely be painful. Lesser degenerative changes at the MCP joints of the thumb, index and long fingers and of the radiocarpal joint. Check uric acid level and can try NSAID for pain relief.  Hyponatremia /Poor oral intake since his recent surgery: Encourage intake. Na now normalized with IVF  Anemia, acute on chronic likely from chronic disease, monitor hemoglobin.    Hyperlipidemia: cont statin  Opiate dependence: According to wife, patient had been on high doses of opiates for back pain in the past.  While wanting optimal pain control, discussed the risks of opiate dependence and withdrawal with patient.  Currently requesting opiates every 3 hours.  Will need gradual taper to avoid withdrawal.   Deconditioning/debility: Symptomatic management PT OT eval once cleared by neurosurgery.   Class I Obesity:Patient's Body mass index is 31.32 kg/m. : Will benefit with PCP follow-up, weight loss  healthy lifestyle and outpatient sleep evaluation.  DVT prophylaxis: Heparin Code Status: Full code Family / Patient Communication: Discussed with patient and wife at bedside. Disposition Plan:   Status is: Inpatient       Time spent: 35 min     >50% time spent in discussions with care team and coordination of care.    Guilford Shi,  MD [facility] Pager in [place]  If 7PM-7AM, please contact night-coverage [email or web address] 09/26/2021, 8:40 AM

## 2021-09-27 ENCOUNTER — Inpatient Hospital Stay (HOSPITAL_COMMUNITY): Payer: Medicare PPO

## 2021-09-27 DIAGNOSIS — M79642 Pain in left hand: Secondary | ICD-10-CM

## 2021-09-27 DIAGNOSIS — M7989 Other specified soft tissue disorders: Secondary | ICD-10-CM

## 2021-09-27 DIAGNOSIS — L0291 Cutaneous abscess, unspecified: Secondary | ICD-10-CM

## 2021-09-27 DIAGNOSIS — R7881 Bacteremia: Secondary | ICD-10-CM

## 2021-09-27 DIAGNOSIS — B9561 Methicillin susceptible Staphylococcus aureus infection as the cause of diseases classified elsewhere: Secondary | ICD-10-CM

## 2021-09-27 DIAGNOSIS — M79643 Pain in unspecified hand: Secondary | ICD-10-CM

## 2021-09-27 DIAGNOSIS — M79602 Pain in left arm: Secondary | ICD-10-CM | POA: Diagnosis not present

## 2021-09-27 LAB — CBC
HCT: 27.3 % — ABNORMAL LOW (ref 39.0–52.0)
Hemoglobin: 8.4 g/dL — ABNORMAL LOW (ref 13.0–17.0)
MCH: 28 pg (ref 26.0–34.0)
MCHC: 30.8 g/dL (ref 30.0–36.0)
MCV: 91 fL (ref 80.0–100.0)
Platelets: 383 10*3/uL (ref 150–400)
RBC: 3 MIL/uL — ABNORMAL LOW (ref 4.22–5.81)
RDW: 14 % (ref 11.5–15.5)
WBC: 5.3 10*3/uL (ref 4.0–10.5)
nRBC: 0 % (ref 0.0–0.2)

## 2021-09-27 LAB — SEDIMENTATION RATE: Sed Rate: 139 mm/hr — ABNORMAL HIGH (ref 0–16)

## 2021-09-27 LAB — BASIC METABOLIC PANEL
Anion gap: 8 (ref 5–15)
BUN: <5 mg/dL — ABNORMAL LOW (ref 8–23)
CO2: 25 mmol/L (ref 22–32)
Calcium: 7.7 mg/dL — ABNORMAL LOW (ref 8.9–10.3)
Chloride: 101 mmol/L (ref 98–111)
Creatinine, Ser: 0.61 mg/dL (ref 0.61–1.24)
GFR, Estimated: >60 mL/min (ref >60)
Glucose, Bld: 126 mg/dL — ABNORMAL HIGH (ref 70–99)
Potassium: 3.3 mmol/L — ABNORMAL LOW (ref 3.5–5.1)
Sodium: 134 mmol/L — ABNORMAL LOW (ref 135–145)

## 2021-09-27 LAB — TSH: TSH: 1.914 u[IU]/mL (ref 0.350–4.500)

## 2021-09-27 LAB — C-REACTIVE PROTEIN: CRP: 18.7 mg/dL — ABNORMAL HIGH (ref <1.0)

## 2021-09-27 MED ORDER — METOPROLOL TARTRATE 25 MG PO TABS
25.0000 mg | ORAL_TABLET | Freq: Once | ORAL | Status: AC
  Administered 2021-09-27: 25 mg via ORAL
  Filled 2021-09-27: qty 1

## 2021-09-27 MED ORDER — POTASSIUM CHLORIDE CRYS ER 20 MEQ PO TBCR
20.0000 meq | EXTENDED_RELEASE_TABLET | Freq: Two times a day (BID) | ORAL | Status: AC
  Administered 2021-09-27 – 2021-09-28 (×4): 20 meq via ORAL
  Filled 2021-09-27 (×4): qty 1

## 2021-09-27 MED ORDER — METOPROLOL TARTRATE 50 MG PO TABS
50.0000 mg | ORAL_TABLET | Freq: Two times a day (BID) | ORAL | Status: DC
  Administered 2021-09-27 – 2021-10-01 (×8): 50 mg via ORAL
  Filled 2021-09-27 (×8): qty 1

## 2021-09-27 MED ORDER — CEFAZOLIN SODIUM-DEXTROSE 2-4 GM/100ML-% IV SOLN
2.0000 g | Freq: Three times a day (TID) | INTRAVENOUS | Status: DC
  Administered 2021-09-27 – 2021-10-01 (×13): 2 g via INTRAVENOUS
  Filled 2021-09-27 (×14): qty 100

## 2021-09-27 NOTE — Progress Notes (Signed)
PROGRESS NOTE    Michael Hill  BMW:[id number] DOB: [birth year] DOA: 09/22/2021 PCP: Tally Joe, MD    Brief Narrative:  Hypertension, hyperlipidemia, GERD, spinal stenosis and lumbar radiculopathy status post L5-S1 decompression fusion on 9/21, admitted 10/5-10/12 for infection of recent surgical site status post I&D on 10/5, acute hypoxemic respiratory failure due to COVID infection, complete hospital course due to severe sepsis, MSSA bacteremia and discharged home on IV Ancef and oral rifampin brought back to ER with worsening back pain, right leg weakness and drainage from the wound. 10/21, lumbar wound I&D with wound VAC placement and continued on Ancef.  Followed by infectious disease and neurosurgery.   Assessment & Plan:   Principal Problem:   Postoperative wound infection Active Problems:   HYPERCHOLESTEROLEMIA   Hyponatremia   Anemia  Postoperative wound infection/recent MSSA bacteremia with severe sepsis: Repeat blood cultures negative. Repeat lumbar wound I&D and wound VAC placement 10/21, cultures negative so far. Followed by ID and neurosurgery, anticipate prolonged antibiotics.  ID to finalize antibiotic duration.  Surgery cleared for PT OT.  Refer to rehab. Pain management remains issue.  Currently on oxycodone 10 mg every 12 hours as well as IV Dilaudid and oral oxycodone and Tylenol.  Sinus tachycardia with history of SVT: Widely fluctuating heart rate.  Mostly SVT/PAT's. Patient on Toprol-XL 50 mg at home, will increase metoprolol 50 mg twice daily.  Control pain.  Hyperlipidemia: On statin.  Left hand pain: Diffuse swelling after surgery.  X-ray consistent with osteoarthritic changes.  No evidence of infection. Uric acid level was normal. Some improvement as per patient. Cold compress/NSAIDs with indomethacin.  If further deteriorates, will do MRI of the left hand.  Hypokalemia: Replace aggressively.  We will check magnesium levels.   DVT prophylaxis:  heparin injection 5,000 Units Start: 09/25/21 0600 SCDs Start: 09/23/21 0532   Code Status: Full code Family Communication: None at the bedside Disposition Plan: Status is: Inpatient  Remains inpatient appropriate because: Active spinal wound on IV antibiotics.  Rehab assessment pending.  Antibiotic orders pending.         Consultants:  Infectious disease Neurosurgery  Procedures:  I&D spinal wound 10/21  Antimicrobials:  Nafcillin 10/5--10/24 Ancef 10/24---   Subjective: Patient seen and examined.  He was having wound VAC change on the back.  Pain is controlled on current dose of pain medications.  Is looking forward for rehab. Left hand is still painful but able to make a grip today. He does not have any chest pain or palpitation, however his monitor shows widely fluctuating heart rate as high as 150 at times.  Sinus rhythm.  Does have history of SVT.  Objective: Vitals:   09/27/21 0018 09/27/21 0441 09/27/21 0750 09/27/21 1103  BP: 120/73 (!) 146/89 121/84 112/62  Pulse: 86 97 98 97  Resp: 20 20 20 18   Temp: 98.2 F (36.8 C) 98.6 F (37 C) 98.3 F (36.8 C) 97.8 F (36.6 C)  TempSrc: Oral Oral Oral Oral  SpO2: 95% 95% 90% 93%  Weight:      Height:        Intake/Output Summary (Last 24 hours) at 09/27/2021 1129 Last data filed at 09/27/2021 0535 Gross per 24 hour  Intake 2206.04 ml  Output 825 ml  Net 1381.04 ml   Filed Weights   09/22/21 1934 09/24/21 1323  Weight: 90.7 kg 90.7 kg    Examination:  General exam: Appears calm and comfortable .  On room air. Respiratory system: Clear to auscultation.  Respiratory effort normal.  No added sounds. Cardiovascular system: S1 & S2 heard, RRR. No JVD, murmurs, rubs, gallops or clicks. No pedal edema.  Heart rate is well controlled at this time. Gastrointestinal system: Abdomen is nondistended, soft and nontender. No organomegaly or masses felt. Normal bowel sounds heard. Central nervous system: Alert and  oriented. No focal neurological deficits. Extremities:  Patient has diffuse swelling mostly on the dorsum of the left hand with no localized erythema or redness.  He has pitting edema. Wound VAC fitted on the, picture available in the chart.       Data Reviewed: I have personally reviewed following labs and imaging studies  CBC: Recent Labs  Lab 09/22/21 2045 09/23/21 0605 09/25/21 1005 09/26/21 0217 09/27/21 0412  WBC 8.7 9.2 6.9 6.1 5.3  NEUTROABS 5.9  --   --   --   --   HGB 9.8* 9.7* 9.4* 8.5* 8.4*  HCT 30.9* 30.9* 29.6* 26.6* 27.3*  MCV 89.8 92.0 88.9 87.5 91.0  PLT 314 352 331 341 383   Basic Metabolic Panel: Recent Labs  Lab 09/22/21 2045 09/23/21 0605 09/25/21 1005 09/26/21 0217 09/27/21 0412  NA 129* 130* 135 133* 134*  K 4.6 4.6 3.5 3.6 3.3*  CL 92* 94* 100 101 101  CO2 27 24 26 25 25   GLUCOSE 114* 109* 192* 139* 126*  BUN 9 10 6* 5* <5*  CREATININE 0.80 0.88 0.76 0.66 0.61  CALCIUM 8.7* 8.6* 7.8* 7.5* 7.7*   GFR: Estimated Creatinine Clearance: 93.6 mL/min (by C-G formula based on SCr of 0.61 mg/dL). Liver Function Tests: Recent Labs  Lab 09/22/21 2045  AST 24  ALT 7  ALKPHOS 66  BILITOT 0.6  PROT 6.2*  ALBUMIN 2.0*   No results for input(s): LIPASE, AMYLASE in the last 168 hours. No results for input(s): AMMONIA in the last 168 hours. Coagulation Profile: No results for input(s): INR, PROTIME in the last 168 hours. Cardiac Enzymes: No results for input(s): CKTOTAL, CKMB, CKMBINDEX, TROPONINI in the last 168 hours. BNP (last 3 results) No results for input(s): PROBNP in the last 8760 hours. HbA1C: No results for input(s): HGBA1C in the last 72 hours. CBG: No results for input(s): GLUCAP in the last 168 hours. Lipid Profile: No results for input(s): CHOL, HDL, LDLCALC, TRIG, CHOLHDL, LDLDIRECT in the last 72 hours. Thyroid Function Tests: Recent Labs    09/27/21 0412  TSH 1.914   Anemia Panel: No results for input(s): VITAMINB12,  FOLATE, FERRITIN, TIBC, IRON, RETICCTPCT in the last 72 hours. Sepsis Labs: No results for input(s): PROCALCITON, LATICACIDVEN in the last 168 hours.  Recent Results (from the past 240 hour(s))  Blood culture (routine x 2)     Status: None (Preliminary result)   Collection Time: 09/23/21 12:53 AM   Specimen: BLOOD  Result Value Ref Range Status   Specimen Description BLOOD LEFT ANTECUBITAL  Final   Special Requests   Final    BOTTLES DRAWN AEROBIC AND ANAEROBIC Blood Culture adequate volume   Culture   Final    NO GROWTH 4 DAYS Performed at [facility], [address] [place], [place]    Report Status PENDING  Incomplete  Blood culture (routine x 2)     Status: None (Preliminary result)   Collection Time: 09/23/21  1:06 AM   Specimen: BLOOD RIGHT HAND  Result Value Ref Range Status   Specimen Description BLOOD RIGHT HAND  Final   Special Requests   Final  BOTTLES DRAWN AEROBIC AND ANAEROBIC Blood Culture adequate volume   Culture   Final    NO GROWTH 4 DAYS Performed at [facility], [address], [place], [place] [address]    Report Status PENDING  Incomplete  Aerobic/Anaerobic Culture w Gram Stain (surgical/deep wound)     Status: None (Preliminary result)   Collection Time: 09/23/21  3:41 AM   Specimen: Wound  Result Value Ref Range Status   Specimen Description WOUND  Final   Special Requests NONE  Final   Gram Stain NO WBC SEEN NO ORGANISMS SEEN   Final   Culture   Final    NO GROWTH 3 DAYS NO ANAEROBES ISOLATED; CULTURE IN PROGRESS FOR 5 DAYS Performed at [facility] Lab, [address] [place], [place] [address]    Report Status PENDING  Incomplete  Surgical pcr screen     Status: None   Collection Time: 09/23/21  8:38 PM   Specimen: Nasal Mucosa; Nasal Swab  Result Value Ref Range Status   MRSA, PCR NEGATIVE NEGATIVE Final   Staphylococcus aureus NEGATIVE NEGATIVE Final    Comment: (NOTE) The Xpert SA Assay (FDA approved for  NASAL specimens in patients 82 years of age and older), is one component of a comprehensive surveillance program. It is not intended to diagnose infection nor to guide or monitor treatment. Performed at [facility], [address] [place], [place] [address]   Aerobic/Anaerobic Culture w Gram Stain (surgical/deep wound)     Status: None (Preliminary result)   Collection Time: 09/24/21  2:50 PM   Specimen: Wound  Result Value Ref Range Status   Specimen Description WOUND  Final   Special Requests LUMBAR  Final   Gram Stain   Final    MODERATE WBC PRESENT, PREDOMINANTLY PMN NO ORGANISMS SEEN    Culture   Final    NO GROWTH 3 DAYS NO ANAEROBES ISOLATED; CULTURE IN PROGRESS FOR 5 DAYS Performed at [facility] Lab, [address], [place], [place] [address]    Report Status PENDING  Incomplete         Radiology Studies: No results found.      Scheduled Meds:  atorvastatin  20 mg Oral Daily   Chlorhexidine Gluconate Cloth  6 each Topical Daily   docusate sodium  100 mg Oral BID   famotidine  20 mg Oral Daily   heparin injection (subcutaneous)  5,000 Units Subcutaneous Q8H   indomethacin  25 mg Oral BID WC   metoprolol tartrate  50 mg Oral BID   oxyCODONE  20 mg Oral Q12H   potassium chloride  20 mEq Oral BID   Continuous Infusions:   ceFAZolin (ANCEF) IV       LOS: 4 days    Time spent: 34 minutes    Dorcas Carrow, MD [facility] Pager [phone]

## 2021-09-27 NOTE — Progress Notes (Signed)
Subjective: Patient reports that his left hand continues to be swollen and painful.  Objective: Vital signs in last 24 hours: Temp:  [98.2 F (36.8 C)-98.8 F (37.1 C)] 98.3 F (36.8 C) (10/24 0750) Pulse Rate:  [86-98] 98 (10/24 0750) Resp:  [18-21] 20 (10/24 0750) BP: (116-152)/(68-89) 121/84 (10/24 0750) SpO2:  [90 %-97 %] 90 % (10/24 0750)  Intake/Output from previous day: 10/23 0701 - 10/24 0700 In: 4384.9 [P.O.:1040; I.V.:2844.9; IV Piggyback:500] Out: 1225 [Urine:1150; Drains:75] Intake/Output this shift: No intake/output data recorded.  Physical Exam: Patient is awake, A/O X 4, conversant, and in good spirits. They are in NAD and VSS. Doing well. Speech is fluent and appropriate. MAEW with good strength that is symmetric bilaterally. 5/5 BUE/BLE. Sensation to light touch is intact. PERLA, EOMI. CNs grossly intact. Wound vac in place. Vac with approximately 75 ml of output overnight.     Lab Results: Recent Labs    09/26/21 0217 09/27/21 0412  WBC 6.1 5.3  HGB 8.5* 8.4*  HCT 26.6* 27.3*  PLT 341 383   BMET Recent Labs    09/26/21 0217 09/27/21 0412  NA 133* 134*  K 3.6 3.3*  CL 101 101  CO2 25 25  GLUCOSE 139* 126*  BUN 5* <5*  CREATININE 0.66 0.61  CALCIUM 7.5* 7.7*    Studies/Results: DG Hand 2 View Left  Result Date: 09/25/2021 CLINICAL DATA:  Pain of the left hand and forearm. EXAM: LEFT HAND - 2 VIEW COMPARISON:  None. FINDINGS: Mild joint space loss at the radiocarpal joint. Chondrocalcinosis of the triangular fibrocartilage. Advanced degenerative change at the first carpometacarpal articulation with joint space narrowing, osteophyte formation and possible intra-articular loose bodies. This would quite likely be painful. 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 Inter phalangeal joints appear unremarkable. 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 Lesser degenerative changes at the MCP joints of the thumb, index and long fingers and of the radiocarpal joint. Electronically Signed   By: Paulina Fusi M.D.   On: 09/25/2021 12:00    Assessment/Plan: Patient is s/p Incision and debridement of lumbar wound and wound vac placement. He is recoveing well. He remains at his neurologic baseline. Lumbar pain symptoms are improving. His biggest complaint is the left hand swelling and pain. Left hand radiograph reveled advanced degenerative change at the first carpometacarpal articulation. UE doppler ordered yesterday.  During my assessment, the patient had atrial fibulation on the monitor with frequent PVC's and then went into SVT. Per the patient, this is new for him. He did state that he had a rapid heart rate yesterday while working with therapy. Will obtain 12 lead EKG and cards consult.    -UE doppler  -12- lead EKG  -Cards consult -Abx per ID -Continue Wound vac  -PT/OT recommending CIR -Continue working with PT/OT -Encourage mobility  -Continue working on pain control   LOS: 4 days     Council Mechanic, DNP, NP-C 09/27/2021, 8:18 AM

## 2021-09-27 NOTE — Plan of Care (Signed)

## 2021-09-27 NOTE — Consult Note (Signed)
WOC Nurse Consult Note: Reason for Consult:NPWT (VAC) dressing change to lumbar spine.  Last surgical clean out 09/24/21. Wound type: surgical wound Pressure Injury POA: NA Measurement: 14 cm x 4 cm x 6.4 cm  Wound SJG:GEZMO red Drainage (amount, consistency, odor) minimal serosanguinous  no odor Periwound:intact Dressing procedure/placement/frequency: Cleanse lumbar wound with NS and pat dry. Applied one piece black foam to wound bed and 1 piece to left side of spine.  Intact skin protected by drape. Covered with drape.  Change mon/Wed/Fri.  Seal immediately achieved at 125 mmHg Will follow.   Maple Hudson MSN, RN, FNP-BC CWON Wound, Ostomy, Continence Nurse Pager [phone]

## 2021-09-27 NOTE — Progress Notes (Signed)
[facility]  Date of Admission:  09/22/2021     Total days of antibiotics 6         ASSESSMENT:  Mr. Hellard is POD #3 from I&D of lumbar wound in the setting of recent MSSA post-operative infection while on Cefazolin. Blood and surgical cultures have been without growth to date. Course has been complicated by left hand swelling of unclear origin. X-rays with advanced degenerative changes. Does not appear to be infectious at this point. Continue wound vac and post-op wound care per Neurosurgery. Continue current dose of Cefazolin. Planning for 6 weeks of treatment since surgery. Monitor cultures until finalized. Remaining medical and supportive care per primary team.   PLAN:  Continue Cefazolin. Monitor cultures until finalized. Await upper extremity venous doppler results. Post-surgical wound care per Neurosurgery Remaining medical and supportive care per primary team.   Principal Problem:   Postoperative wound infection Active Problems:   HYPERCHOLESTEROLEMIA   Hyponatremia   Anemia    atorvastatin  20 mg Oral Daily   Chlorhexidine Gluconate Cloth  6 each Topical Daily   docusate sodium  100 mg Oral BID   famotidine  20 mg Oral Daily   heparin injection (subcutaneous)  5,000 Units Subcutaneous Q8H   indomethacin  25 mg Oral BID WC   metoprolol tartrate  50 mg Oral BID   oxyCODONE  20 mg Oral Q12H   potassium chloride  20 mEq Oral BID    SUBJECTIVE:  Afebrile overnight with no acute events. Continues to have left hand swelling that is mildly improved today.   No Known Allergies   Review of Systems: Review of Systems  Constitutional:  Negative for chills, fever and weight loss.  Respiratory:  Negative for cough, shortness of breath and wheezing.   Cardiovascular:  Negative for chest pain and leg swelling.  Gastrointestinal:  Negative for abdominal pain, constipation, diarrhea, nausea and vomiting.  Musculoskeletal:        Right  hand edema with mild tenderness. Able to slightly grip hand and walker.   Skin:  Negative for rash.     OBJECTIVE: Vitals:   09/27/21 0018 09/27/21 0441 09/27/21 0750 09/27/21 1103  BP: 120/73 (!) 146/89 121/84 112/62  Pulse: 86 97 98 97  Resp: 20 20 20 18   Temp: 98.2 F (36.8 C) 98.6 F (37 C) 98.3 F (36.8 C) 97.8 F (36.6 C)  TempSrc: Oral Oral Oral Oral  SpO2: 95% 95% 90% 93%  Weight:      Height:       Body mass index is 31.32 kg/m.  Physical Exam Constitutional:      General: He is not in acute distress.    Appearance: He is well-developed.     Comments: Seated on the side of the bed;pleasant.  Working with PT  Cardiovascular:     Rate and Rhythm: Normal rate and regular rhythm.     Heart sounds: Normal heart sounds.  Pulmonary:     Effort: Pulmonary effort is normal.     Breath sounds: Normal breath sounds.  Skin:    General: Skin is warm and dry.  Neurological:     Mental Status: He is alert and oriented to person, place, and time.  Psychiatric:        Mood and Affect: Mood normal.    Lab Results Lab Results  Component Value Date   WBC 5.3 09/27/2021   HGB 8.4 (L) 09/27/2021   HCT 27.3 (L) 09/27/2021  MCV 91.0 09/27/2021   PLT 383 09/27/2021    Lab Results  Component Value Date   CREATININE 0.61 09/27/2021   BUN <5 (L) 09/27/2021   NA 134 (L) 09/27/2021   K 3.3 (L) 09/27/2021   CL 101 09/27/2021   CO2 25 09/27/2021    Lab Results  Component Value Date   ALT 7 09/22/2021   AST 24 09/22/2021   ALKPHOS 66 09/22/2021   BILITOT 0.6 09/22/2021     Microbiology: Recent Results (from the past 240 hour(s))  Blood culture (routine x 2)     Status: None (Preliminary result)   Collection Time: 09/23/21 12:53 AM   Specimen: BLOOD  Result Value Ref Range Status   Specimen Description BLOOD LEFT ANTECUBITAL  Final   Special Requests   Final    BOTTLES DRAWN AEROBIC AND ANAEROBIC Blood Culture adequate volume   Culture   Final    NO GROWTH 4  DAYS Performed at [facility], [address], [place], [place] [address]    Report Status PENDING  Incomplete  Blood culture (routine x 2)     Status: None (Preliminary result)   Collection Time: 09/23/21  1:06 AM   Specimen: BLOOD RIGHT HAND  Result Value Ref Range Status   Specimen Description BLOOD RIGHT HAND  Final   Special Requests   Final    BOTTLES DRAWN AEROBIC AND ANAEROBIC Blood Culture adequate volume   Culture   Final    NO GROWTH 4 DAYS Performed at [facility], [address] [place], [place] [address]    Report Status PENDING  Incomplete  Aerobic/Anaerobic Culture w Gram Stain (surgical/deep wound)     Status: None (Preliminary result)   Collection Time: 09/23/21  3:41 AM   Specimen: Wound  Result Value Ref Range Status   Specimen Description WOUND  Final   Special Requests NONE  Final   Gram Stain NO WBC SEEN NO ORGANISMS SEEN   Final   Culture   Final    NO GROWTH 4 DAYS NO ANAEROBES ISOLATED; CULTURE IN PROGRESS FOR 5 DAYS Performed at [facility], [address] [place], [place] [address]    Report Status PENDING  Incomplete  Surgical pcr screen     Status: None   Collection Time: 09/23/21  8:38 PM   Specimen: Nasal Mucosa; Nasal Swab  Result Value Ref Range Status   MRSA, PCR NEGATIVE NEGATIVE Final   Staphylococcus aureus NEGATIVE NEGATIVE Final    Comment: (NOTE) The Xpert SA Assay (FDA approved for NASAL specimens in patients 21 years of age and older), is one component of a comprehensive surveillance program. It is not intended to diagnose infection nor to guide or monitor treatment. Performed at [facility], [address] [place], [place] [address]   Aerobic/Anaerobic Culture w Gram Stain (surgical/deep wound)     Status: None (Preliminary result)   Collection Time: 09/24/21  2:50 PM   Specimen: Wound  Result Value Ref Range Status   Specimen Description WOUND  Final   Special Requests LUMBAR  Final   Gram  Stain   Final    MODERATE WBC PRESENT, PREDOMINANTLY PMN NO ORGANISMS SEEN    Culture   Final    NO GROWTH 3 DAYS NO ANAEROBES ISOLATED; CULTURE IN PROGRESS FOR 5 DAYS Performed at [facility], [address] [place], [place] [address]    Report Status PENDING  Incomplete  Marcos Eke, NP [facility]  09/27/2021  2:10 PM

## 2021-09-27 NOTE — Progress Notes (Addendum)
Inpatient Rehab Admissions Coordinator:   Met with patient and his wife at the bedside to discuss CIR recommendations and goals/expectations of CIR admit.  Pt feels like he is progressing very well and hopeful he will be able to discharge home, spouse more favorable for rehab.  I reviewed expectations of 3 hrs/day of therapy with average length of stay to be about 2 weeks (but dependent upon progress), and I reviewed that prior auth would be required from [facility].  Both mentioned that there may be plans for further surgery, but unclear whether that would be in a few days or a few weeks, so I will try to touch base with the medical team for clarification.  Pt is agreeable to beginning insurance prior auth for CIR just in case, and continuing to work with therapy on acute in the hopes of being able to discharge home.    Addendum: Per Weston Brass, NP, plan for surgery possibly later this week.  Will follow.  Insurance pending.   Shann Medal, PT, DPT Admissions Coordinator [phone] 09/27/21  3:27 PM

## 2021-09-27 NOTE — Care Management Important Message (Signed)
Important Message  Patient Details  Name: Michael Hill MRN: [id number] Date of Birth: 1951-04-01   Medicare Important Message Given:  Yes    Karra Pink Stefan Church 09/27/2021, 3:56 PM

## 2021-09-27 NOTE — Progress Notes (Signed)
Physical Therapy Treatment Patient Details Name: Michael Hill MRN: [id number] DOB: 1951/08/22 Today's Date: 09/27/2021   History of Present Illness 70 y/o male presented to ED on 10/19 for increasing back pain, R leg weakness, and worsening mobility. Initial L5-S1 decompression/fusion with extension to pelvis on 08/25/21. Patient with recent admission 10/5-10/11/22 for infection of recent surgical site s/p I&D on 10/5. MRI showed large fluid collection in soft tissues of the back which likely extends to posterior aspect of thecal sac at L5-S1. S/p lumbar wound I&D with wound VAC placement on 10/21. PMH: HTN, HLD, chronic back pain, GERD    PT Comments    Patient progressing well with mobility. Very specific about how he wants things positioned (bed height, RW, hand placement) for perfect setup prior to mobility. Requires Mod Michael for bed mobility and Min Michael for transfers, likely more assist needed from lower surfaces. Tolerated gait training today with min Michael and use of RW. Noted to have left hand edema with limited AROM into flexion. MD aware. HR ranging from 90s-120 bpm, Michael-fib. Able to state back precautions. Encouraged OOB to chair and walking to bathroom with nursing as needed. Will continue to follow and progress as tolerated.     Recommendations for follow up therapy are one component of Michael multi-disciplinary discharge planning process, led by the attending physician.  Recommendations may be updated based on patient status, additional functional criteria and insurance authorization.  Follow Up Recommendations  Acute inpatient rehab (3hours/day)     Assistance Recommended at Discharge Frequent or constant Supervision/Assistance  Equipment Recommendations  Rolling walker (2 wheels)    Recommendations for Other Services       Precautions / Restrictions Precautions Precautions: Fall;Back Precaution Booklet Issued: No Precaution Comments: Able to recall back precautions Required Braces  or Orthoses: Spinal Brace Spinal Brace: Lumbar corset;Applied in sitting position Restrictions Weight Bearing Restrictions: No     Mobility  Bed Mobility Overal bed mobility: Needs Assistance Bed Mobility: Sidelying to Sit   Sidelying to sit: HOB elevated;Mod assist       General bed mobility comments: Assist to elevate trunk and assist with bringing LEs off bed.    Transfers Overall transfer level: Needs assistance Equipment used: Rolling walker (2 wheels) Transfers: Sit to/from Stand Sit to Stand: Min assist;From elevated surface           General transfer comment: Pt wanting to pull up on RW with elevated bed height; very specific how he wants to do transfers/transitions, "can we do it this way this time?" Min Michael to steady. Stood from [employer], from elevated toilet x1, transferred to chair.    Ambulation/Gait Ambulation/Gait assistance: Min assist;+2 safety/equipment Gait Distance (Feet): 20 Feet (x2 bouts) Assistive device: Rolling walker (2 wheels) Gait Pattern/deviations: Step-through pattern;Decreased stride length Gait velocity: decreased   General Gait Details: Slow, guarded and mildly unsteady gait with RW for support; bil knee weakness but no buckling. 1 seated rest break on toilet. HR 90s-120 bpm Michael-fib.   Stairs             Wheelchair Mobility    Modified Rankin (Stroke Patients Only)       Balance Overall balance assessment: Needs assistance Sitting-balance support: No upper extremity supported;Feet supported Sitting balance-Leahy Scale: Good Sitting balance - Comments: Only able to use RUE to donn brace due to left hand swelling.   Standing balance support: During functional activity;Bilateral upper extremity supported Standing balance-Leahy Scale: Poor Standing balance comment: reliant on UE  support and external assist                            Cognition Arousal/Alertness: Awake/alert Behavior During Therapy: WFL for tasks  assessed/performed Overall Cognitive Status: Within Functional Limits for tasks assessed                                 General Comments: Frustrated with everything that is happening one after the other.        Exercises      General Comments General comments (skin integrity, edema, etc.): Left hand swelling; tightness and limited range for digit flexion.      Pertinent Vitals/Pain Pain Assessment: 0-10 Pain Score: 3  Pain Location: back Pain Descriptors / Indicators: Sore Pain Intervention(s): Monitored during session;Premedicated before session;Limited activity within patient's tolerance;Repositioned    Home Living                          Prior Function            PT Goals (current goals can now be found in the care plan section) Progress towards PT goals: Progressing toward goals    Frequency    Min 5X/week      PT Plan Current plan remains appropriate    Co-evaluation              AM-PAC PT "6 Clicks" Mobility   Outcome Measure  Help needed turning from your back to your side while in Michael flat bed without using bedrails?: Michael Little Help needed moving from lying on your back to sitting on the side of Michael flat bed without using bedrails?: Michael Lot Help needed moving to and from Michael bed to Michael chair (including Michael wheelchair)?: Michael Little Help needed standing up from Michael chair using your arms (e.g., wheelchair or bedside chair)?: Michael Lot Help needed to walk in hospital room?: Michael Little Help needed climbing 3-5 steps with Michael railing? : Total 6 Click Score: 14    End of Session Equipment Utilized During Treatment: Back brace;Gait belt Activity Tolerance: Patient tolerated treatment well;Patient limited by fatigue Patient left: in chair;with call bell/phone within reach;with chair alarm set Nurse Communication: Mobility status PT Visit Diagnosis: Other abnormalities of gait and mobility (R26.89);Muscle weakness (generalized) (M62.81)     Time:  1610-9604 PT Time Calculation (min) (ACUTE ONLY): 29 min  Charges:  $Gait Training: 8-22 mins $Therapeutic Activity: 8-22 mins                     Michael Hill, PT, DPT Acute Rehabilitation Services Pager [phone] Office [phone]      Michael Hill Michael Hill 09/27/2021, 12:12 PM

## 2021-09-27 NOTE — Progress Notes (Signed)
VASCULAR LAB    Left upper extremity venous duplex has been performed.  See CV proc for preliminary results.   Cono Gebhard, RVT 09/27/2021, 1:15 PM

## 2021-09-28 ENCOUNTER — Telehealth: Payer: Medicare PPO | Admitting: Internal Medicine

## 2021-09-28 DIAGNOSIS — T8149XA Infection following a procedure, other surgical site, initial encounter: Secondary | ICD-10-CM | POA: Diagnosis not present

## 2021-09-28 DIAGNOSIS — R630 Anorexia: Secondary | ICD-10-CM

## 2021-09-28 DIAGNOSIS — T847XXA Infection and inflammatory reaction due to other internal orthopedic prosthetic devices, implants and grafts, initial encounter: Secondary | ICD-10-CM

## 2021-09-28 DIAGNOSIS — R7881 Bacteremia: Secondary | ICD-10-CM | POA: Diagnosis not present

## 2021-09-28 DIAGNOSIS — T847XXD Infection and inflammatory reaction due to other internal orthopedic prosthetic devices, implants and grafts, subsequent encounter: Secondary | ICD-10-CM | POA: Diagnosis not present

## 2021-09-28 DIAGNOSIS — L0291 Cutaneous abscess, unspecified: Secondary | ICD-10-CM | POA: Diagnosis not present

## 2021-09-28 LAB — AEROBIC/ANAEROBIC CULTURE W GRAM STAIN (SURGICAL/DEEP WOUND)
Culture: NO GROWTH
Gram Stain: NONE SEEN

## 2021-09-28 LAB — CULTURE, BLOOD (ROUTINE X 2)
Culture: NO GROWTH
Culture: NO GROWTH
Special Requests: ADEQUATE
Special Requests: ADEQUATE

## 2021-09-28 LAB — BASIC METABOLIC PANEL
Anion gap: 5 (ref 5–15)
BUN: 5 mg/dL — ABNORMAL LOW (ref 8–23)
CO2: 27 mmol/L (ref 22–32)
Calcium: 7.9 mg/dL — ABNORMAL LOW (ref 8.9–10.3)
Chloride: 103 mmol/L (ref 98–111)
Creatinine, Ser: 0.7 mg/dL (ref 0.61–1.24)
GFR, Estimated: >60 mL/min (ref >60)
Glucose, Bld: 125 mg/dL — ABNORMAL HIGH (ref 70–99)
Potassium: 3.6 mmol/L (ref 3.5–5.1)
Sodium: 135 mmol/L (ref 135–145)

## 2021-09-28 LAB — CBC
HCT: 25.7 % — ABNORMAL LOW (ref 39.0–52.0)
Hemoglobin: 8.1 g/dL — ABNORMAL LOW (ref 13.0–17.0)
MCH: 28.2 pg (ref 26.0–34.0)
MCHC: 31.5 g/dL (ref 30.0–36.0)
MCV: 89.5 fL (ref 80.0–100.0)
Platelets: 371 10*3/uL (ref 150–400)
RBC: 2.87 MIL/uL — ABNORMAL LOW (ref 4.22–5.81)
RDW: 13.9 % (ref 11.5–15.5)
WBC: 4.6 10*3/uL (ref 4.0–10.5)
nRBC: 0 % (ref 0.0–0.2)

## 2021-09-28 LAB — MAGNESIUM: Magnesium: 1.8 mg/dL (ref 1.7–2.4)

## 2021-09-28 LAB — PHOSPHORUS: Phosphorus: 2.8 mg/dL (ref 2.5–4.6)

## 2021-09-28 MED ORDER — ZOLPIDEM TARTRATE 5 MG PO TABS: 5.0000 mg | ORAL_TABLET | Freq: Every evening | ORAL | Status: DC | PRN

## 2021-09-28 MED ORDER — OXYCODONE HCL 5 MG PO TABS
5.0000 mg | ORAL_TABLET | ORAL | Status: DC | PRN
  Administered 2021-09-28 – 2021-09-30 (×14): 10 mg via ORAL
  Administered 2021-09-30: 5 mg via ORAL
  Administered 2021-09-30 – 2021-10-01 (×4): 10 mg via ORAL
  Filled 2021-09-28 (×2): qty 2
  Filled 2021-09-28: qty 1
  Filled 2021-09-28 (×16): qty 2

## 2021-09-28 NOTE — Progress Notes (Signed)
Physical Therapy Treatment Patient Details Name: Michael Hill MRN: [id number] DOB: 1951/05/13 Today's Date: 09/28/2021   History of Present Illness 70 y/o male presented to ED on 10/19 for increasing back pain, R leg weakness, and worsening mobility. Initial L5-S1 decompression/fusion with extension to pelvis on 08/25/21. Patient with recent admission 10/5-10/11/22 for infection of recent surgical site s/p I&D on 10/5. MRI showed large fluid collection in soft tissues of the back which likely extends to posterior aspect of thecal sac at L5-S1. S/p lumbar wound I&D with wound VAC placement on 10/21. PMH: HTN, HLD, chronic back pain, GERD    PT Comments    Pt worked with PT after OT to increase tolerance for activity, chair brought behind pt for safety with ambulation since he was fatigued. Worked on ambulation on incline/ decline which was challenging for pt with HR up to 110's. Pt continues to need hands on assist with mobility. Continue to recommend CIR at d/c. PT will continue to follow.    Recommendations for follow up therapy are one component of a multi-disciplinary discharge planning process, led by the attending physician.  Recommendations may be updated based on patient status, additional functional criteria and insurance authorization.  Follow Up Recommendations  Acute inpatient rehab (3hours/day)     Assistance Recommended at Discharge Frequent or constant Supervision/Assistance  Equipment Recommendations  Rolling walker (2 wheels)    Recommendations for Other Services Rehab consult;OT consult     Precautions / Restrictions Precautions Precautions: Fall;Back Precaution Booklet Issued: No Precaution Comments: Able to recall back precautions Required Braces or Orthoses: Spinal Brace Spinal Brace: Lumbar corset;Applied in sitting position Restrictions Weight Bearing Restrictions: No     Mobility  Bed Mobility Overal bed mobility: Needs Assistance Bed Mobility:  Sidelying to Sit   Sidelying to sit: Min guard       General bed mobility comments: up in chair    Transfers Overall transfer level: Needs assistance Equipment used: Rolling walker (2 wheels) Transfers: Sit to/from [employer] Transfers Sit to Stand: Min guard Stand pivot transfers: Min guard (cues for safety to prevent twisting)         General transfer comment: sit<>stand 3x from recliner    Ambulation/Gait Ambulation/Gait assistance: Min assist Gait Distance (Feet): 100 Feet (2x) Assistive device: Rolling walker (2 wheels) Gait Pattern/deviations: Step-through pattern;Decreased stride length Gait velocity: decreased Gait velocity interpretation: <1.8 ft/sec, indicate of risk for recurrent falls General Gait Details: chair brought behind as pt had just worked with OT. Worked on walking down decline and back up incline, pt had difficulty going up, fatigue apparent. Worked on abdominal activation/ posture during gait and increasing stamina   Stairs             Wheelchair Mobility    Modified Rankin (Stroke Patients Only)       Balance Overall balance assessment: Needs assistance Sitting-balance support: No upper extremity supported;Feet supported Sitting balance-Leahy Scale: Good     Standing balance support: No upper extremity supported;Bilateral upper extremity supported;During functional activity Standing balance-Leahy Scale: Poor Standing balance comment: during dynamic movement benefits from RW                            Cognition Arousal/Alertness: Awake/alert Behavior During Therapy: WFL for tasks assessed/performed Overall Cognitive Status: Within Functional Limits for tasks assessed  Exercises      General Comments General comments (skin integrity, edema, etc.): retrograde massage to L hand with education to pt and wife. Discussed supine pelvic tilts 2x/ day 10-20  reps      Pertinent Vitals/Pain Pain Assessment: Faces Faces Pain Scale: Hurts little more Pain Location: back Pain Descriptors / Indicators: Sore;Tightness Pain Intervention(s): Limited activity within patient's tolerance;Monitored during session    Home Living                          Prior Function            PT Goals (current goals can now be found in the care plan section) Acute Rehab PT Goals Patient Stated Goal: to get stronger and build confidence PT Goal Formulation: With patient/family Time For Goal Achievement: 10/09/21 Potential to Achieve Goals: Good Progress towards PT goals: Progressing toward goals    Frequency    Min 5X/week      PT Plan Current plan remains appropriate    Hill-evaluation              AM-PAC PT "6 Clicks" Mobility   Outcome Measure  Help needed turning from your back to your side while in a flat bed without using bedrails?: A Little Help needed moving from lying on your back to sitting on the side of a flat bed without using bedrails?: A Lot Help needed moving to and from a bed to a chair (including a wheelchair)?: A Little Help needed standing up from a chair using your arms (e.g., wheelchair or bedside chair)?: A Lot Help needed to walk in hospital room?: A Little Help needed climbing 3-5 steps with a railing? : Total 6 Click Score: 14    End of Session Equipment Utilized During Treatment: Back brace;Gait belt Activity Tolerance: Patient tolerated treatment well;Patient limited by fatigue Patient left: in chair;with call bell/phone within reach;with chair alarm set;with family/visitor present Nurse Communication: Mobility status PT Visit Diagnosis: Other abnormalities of gait and mobility (R26.89);Muscle weakness (generalized) (M62.81)     Time: 8119-1478 PT Time Calculation (min) (ACUTE ONLY): 33 min  Charges:  $Gait Training: 23-37 mins                     Michael Hill, PT  Acute Rehab Services   Pager [phone] Office [phone]    Michael Hill 09/28/2021, 2:45 PM

## 2021-09-28 NOTE — Progress Notes (Signed)
Inpatient Rehab Admissions Coordinator:   Note pt mobilizing well with OT today.  Plan for possible surgery later this week, so withdrew insurance request until that time.  If post-op therapy recommendations remain for CIR I will restart insurance request.  We will follow.    Estill Dooms, PT, DPT Admissions Coordinator [phone] 09/28/21  2:39 PM

## 2021-09-28 NOTE — Plan of Care (Signed)

## 2021-09-28 NOTE — Progress Notes (Signed)
Subjective: Patient reports that he feels as though he is recovering overall, however, he stated that he is not able to sleep at night and that his mind goes to "dark places" at night. He is hopeful that he will be able to discharge home soon. Left hand is improving. No acute events overnight.   Objective: Vital signs in last 24 hours: Temp:  [97.7 F (36.5 C)-98.6 F (37 C)] 97.9 F (36.6 C) (10/25 0801) Pulse Rate:  [81-97] 86 (10/25 0801) Resp:  [17-20] 20 (10/25 0801) BP: (106-137)/(62-80) 136/76 (10/25 0801) SpO2:  [90 %-98 %] 98 % (10/25 0801)  Intake/Output from previous day: 10/24 0701 - 10/25 0700 In: 900 [P.O.:900] Out: -  Intake/Output this shift: No intake/output data recorded.  Physical Exam: Patient is awake, A/O X 4, conversant, and in good spirits. They are in NAD and VSS. Doing well. Speech is fluent and appropriate. MAEW with good strength that is symmetric bilaterally. 5/5 BUE/BLE. Sensation to light touch is intact. PERLA, EOMI. CNs grossly intact. Wound vac in place.    Lab Results: Recent Labs    09/27/21 0412 09/28/21 0258  WBC 5.3 4.6  HGB 8.4* 8.1*  HCT 27.3* 25.7*  PLT 383 371   BMET Recent Labs    09/27/21 0412 09/28/21 0258  NA 134* 135  K 3.3* 3.6  CL 101 103  CO2 25 27  GLUCOSE 126* 125*  BUN <5* 5*  CREATININE 0.61 0.70  CALCIUM 7.7* 7.9*    Studies/Results: VAS [place] UPPER EXTREMITY VENOUS DUPLEX  Result Date: 09/27/2021 UPPER VENOUS STUDY  Patient Name:  Michael Hill  Date of Exam:   09/27/2021 Medical Rec #: [id number]        Accession #:    [id number] Date of Birth: [birth year]       Patient Gender: M Patient Age:   70 years Exam Location:  [facility] Procedure:      VAS [place] UPPER EXTREMITY VENOUS DUPLEX Referring Phys: Hoyt Koch --------------------------------------------------------------------------------  Indications: Pain, and Swelling of left hand post op Limitations: Patient in chair. Comparison Study: No  prior study Performing Technologist: Sherren Kerns RVS  Examination Guidelines: A complete evaluation includes B-mode imaging, spectral Doppler, color Doppler, and power Doppler as needed of all accessible portions of each vessel. Bilateral testing is considered an integral part of a complete examination. Limited examinations for reoccurring indications may be performed as noted.  Right Findings: +----------+------------+---------+-----------+----------+-------+ RIGHT     CompressiblePhasicitySpontaneousPropertiesSummary +----------+------------+---------+-----------+----------+-------+ Subclavian               Yes       Yes                      +----------+------------+---------+-----------+----------+-------+  Left Findings: +----------+------------+---------+-----------+----------+-------+ LEFT      CompressiblePhasicitySpontaneousPropertiesSummary +----------+------------+---------+-----------+----------+-------+ IJV                      Yes       Yes                      +----------+------------+---------+-----------+----------+-------+ Subclavian               Yes       Yes                      +----------+------------+---------+-----------+----------+-------+ Axillary                 Yes  Yes                      +----------+------------+---------+-----------+----------+-------+ Brachial      Full                                          +----------+------------+---------+-----------+----------+-------+ Radial        Full                                          +----------+------------+---------+-----------+----------+-------+ Ulnar         Full                                          +----------+------------+---------+-----------+----------+-------+ Cephalic      Full                                          +----------+------------+---------+-----------+----------+-------+ Basilic       Full                                           +----------+------------+---------+-----------+----------+-------+  Summary:  Right: No evidence of thrombosis in the subclavian.  Left: No evidence of deep vein thrombosis in the upper extremity. No evidence of superficial vein thrombosis in the upper extremity. Interstitial fluid noted left forearm and hand.  *See table(s) above for measurements and observations.  Diagnosing physician: Sherald Hess MD Electronically signed by Sherald Hess MD on 09/27/2021 at 3:49:58 PM.    Final     Assessment/Plan: Patient is s/p Incision and debridement of lumbar wound and wound vac placement. He is continuing to recover well and is neurological exam remains at his baseline. Left hand pain and swelling has decreased, grip improved. His biggest complaint is not sleeping at night and pain control. Medical team to add PRN Ambien. Oxycodone IR increased from 5mg  Q3H PRN to 5-10mg . Inflammatory markers remain elevated. Will reassess readiness for wound closure latter in the week.    -Abx per ID -Continue Wound vac  -PT/OT recommending CIR -Continue working with PT/OT -Encourage mobility  -Continue working on pain control   LOS: 5 days     , DNP, NP-C 09/28/2021, 8:06 AM

## 2021-09-28 NOTE — Progress Notes (Signed)
PHARMACY CONSULT NOTE FOR:  OUTPATIENT  PARENTERAL ANTIBIOTIC THERAPY (OPAT)  Indication: Lumbar Infection- MSSA Regimen: Cefazolin 2g IV q8h End date: 11/05/21  IV antibiotic discharge orders are pended. To discharging provider:  please sign these orders via discharge navigator,  Select New Orders & click on the button choice - Manage This Unsigned Work.     Thank you for allowing pharmacy to be a part of this patient's care.  Shirlee More, PharmD PGY2 Infectious Diseases Pharmacy Resident   Please check [email or web address] for unit-specific pharmacy phone numbers

## 2021-09-28 NOTE — Progress Notes (Signed)
Occupational Therapy Treatment Patient Details Name: Michael Hill MRN: [id number] DOB: May 29, 1951 Today's Date: 09/28/2021   History of present illness 70 y/o male presented to ED on 10/19 for increasing back pain, R leg weakness, and worsening mobility. Initial L5-S1 decompression/fusion with extension to pelvis on 08/25/21. Patient with recent admission 10/5-10/11/22 for infection of recent surgical site s/p I&D on 10/5. MRI showed large fluid collection in soft tissues of the back which likely extends to posterior aspect of thecal sac at L5-S1. S/p lumbar wound I&D with wound VAC placement on 10/21. PMH: HTN, HLD, chronic back pain, GERD   OT comments  Pt progressing well towards OT goals this session. Pt VSS throughout session with HR max of 83 and only a little SOB (SpO2 >90% on RA).  Pt was able to ambulate into bathroom with RW, perform standing grooming at min guard with implementation of compensatory strategies for oral care without cues from therapist. Pt DID require cues to prevent twisting during grooming. LUE with noted less edema from previous days and while it does have decreased ROM and strength due to pain/tightness- it is functional for grooming tasks. Educated Pt and wife on toilet aide (with demo on how to wrap paper) to assist with rear peri care. Pt then feeling well enough to go for hallway ambulation. OT will monitor closely for progress past the need for CIR - but after lengthy discussion with patient and wife currently continuing to recommend CIR at this time to maximize safety and independence in ADL and functional transfers.    Recommendations for follow up therapy are one component of a multi-disciplinary discharge planning process, led by the attending physician.  Recommendations may be updated based on patient status, additional functional criteria and insurance authorization.    Follow Up Recommendations  Acute inpatient rehab (3hours/day)    Assistance Recommended  at Discharge    Equipment Recommendations  None recommended by OT (Pt has appropriate DME)    Recommendations for Other Services      Precautions / Restrictions Precautions Precautions: Fall;Back Precaution Booklet Issued: No Precaution Comments: Able to recall back precautions Required Braces or Orthoses: Spinal Brace Spinal Brace: Lumbar corset;Applied in sitting position Restrictions Weight Bearing Restrictions: No       Mobility Bed Mobility Overal bed mobility: Needs Assistance Bed Mobility: Sidelying to Sit   Sidelying to sit: Min guard       General bed mobility comments: min guard for back precaution maintenance    Transfers Overall transfer level: Needs assistance Equipment used: Rolling walker (2 wheels) Transfers: Sit to/from [employer] Sit to Stand: Min guard Stand pivot transfers: Min guard (cues for safety to prevent twisting)         General transfer comment: cue for at least one arm pushing from seated surface (from elevated bed and reclinerx3)     Balance Overall balance assessment: Needs assistance Sitting-balance support: No upper extremity supported;Feet supported Sitting balance-Leahy Scale: Good     Standing balance support: No upper extremity supported;Bilateral upper extremity supported;During functional activity Standing balance-Leahy Scale: Poor Standing balance comment: during dynamic movement benefits from RW - able to perform standing grooming without UE support                           ADL either performed or assessed with clinical judgement   ADL Overall ADL's : Needs assistance/impaired     Grooming: Wash/dry hands;Oral care;Wash/dry face;Min guard;Standing Grooming Details (  indicate cue type and reason): utilizing cup method for oral care, min cues for twisting precautions         Upper Body Dressing : Minimal assistance;Cueing for sequencing;Sitting Upper Body Dressing Details (indicate cue  type and reason): to don brace, cues for extra cinch support Lower Body Dressing: Maximal assistance;Sit to/from stand Lower Body Dressing Details (indicate cue type and reason): to don shoes/sandals Toilet Transfer: Min guard;Ambulation;Rolling walker (2 wheels)   Toileting- Clothing Manipulation and Hygiene: Min guard;Sit to/from stand Toileting - Clothing Manipulation Details (indicate cue type and reason): educated and demo-ed toilet aide for Pt and wife     Functional mobility during ADLs: Min guard;Rolling walker (2 wheels)       Vision       Perception     Praxis      Cognition Arousal/Alertness: Awake/alert Behavior During Therapy: WFL for tasks assessed/performed Overall Cognitive Status: Within Functional Limits for tasks assessed                                            Exercises     Shoulder Instructions       General Comments HR much improved, max of 83 this session, DOE 1/4 with activity and mask on SpO2 WFL    Pertinent Vitals/ Pain       Pain Assessment: Faces Faces Pain Scale: Hurts little more Pain Location: back and L arm/hand Pain Descriptors / Indicators: Sore;Tightness;Heaviness Pain Intervention(s): Monitored during session;Repositioned;RN gave pain meds during session  Home Living                                          Prior Functioning/Environment              Frequency  Min 2X/week        Progress Toward Goals  OT Goals(current goals can now be found in the care plan section)  Progress towards OT goals: Progressing toward goals  Acute Rehab OT Goals OT Goal Formulation: With patient/family Time For Goal Achievement: 10/10/21 Potential to Achieve Goals: Good  Plan Discharge plan remains appropriate    Co-evaluation                 AM-PAC OT "6 Clicks" Daily Activity     Outcome Measure   Help from another person eating meals?: None Help from another person taking care  of personal grooming?: A Little Help from another person toileting, which includes using toliet, bedpan, or urinal?: A Lot Help from another person bathing (including washing, rinsing, drying)?: A Lot Help from another person to put on and taking off regular upper body clothing?: A Little Help from another person to put on and taking off regular lower body clothing?: A Lot 6 Click Score: 16    End of Session Equipment Utilized During Treatment: Gait belt;Rolling walker (2 wheels)  OT Visit Diagnosis: Unsteadiness on feet (R26.81);Muscle weakness (generalized) (M62.81);Pain Pain - Right/Left: Left Pain - part of body: Hand (back)   Activity Tolerance Patient tolerated treatment well   Patient Left in chair;with call bell/phone within reach;with family/visitor present   Nurse Communication Mobility status        Time: 9147-8295 OT Time Calculation (min): 35 min  Charges: OT General Charges $OT Visit: 1 Visit  OT Treatments $Self Care/Home Management : 8-22 mins $Therapeutic Activity: 8-22 mins  Nyoka Cowden OTR/L Acute Rehabilitation Services Pager: [phone] Office: [phone]  Evern Bio Ahad Colarusso 09/28/2021, 12:32 PM

## 2021-09-28 NOTE — Progress Notes (Signed)
[facility]  Date of Admission:  09/22/2021     Total days of antibiotics 7         ASSESSMENT:  [name] surgical cultures remain without growth. Appears source control achieved with the most recent I&D. Left hand improving daily with no evidence of DVT on dopplers. Does not appear to be infection. Discussed plan of care to include 6 weeks of Cefazolin with end date of 11/05/21 and will likely need longer term suppression with oral antibiotics afterwards. Rifampin considered but he did not tolerate well. Disposition being evaluated. Discussed plan of care with Michael Hill and his wife. Will complete OPAT/Home Health orders and schedule follow up clinic visit. Continue current dose of Cefazolin with remaining medical and supportive care per Neurosurgery and Primary Team.   PLAN:  Continue Cefazolin through 11/05/21. Continue wound care per Neurosurgery OPAT/Home Health orders.  Remaining medical care per Primary Team.   Diagnosis:  Post-operative wound infection  Culture Result: MSSA   No Known Allergies  OPAT Orders Discharge antibiotics to be given via PICC line Discharge antibiotics: Cefazolin  Per pharmacy protocol  Duration: 6 weeks  End Date: 11/05/21   [facility] Care Per Protocol:  Home health RN for IV administration and teaching; PICC line care and labs.    Labs weekly while on IV antibiotics: _X_ CBC with differential _X_ BMP __ CMP _X_ CRP _X_ ESR __ Vancomycin trough __ CK  __ Please pull PIC at completion of IV antibiotics _X_ Please leave PIC in place until doctor has seen patient or been notified  Fax weekly labs to [phone]  Clinic Follow Up Appt:  10/22/21 at 4:15pm with Dr. Tommy Hill   Principal Problem:   Postoperative wound infection Active Problems:   HYPERCHOLESTEROLEMIA   Hyponatremia   Anemia   Painful hand   MSSA bacteremia   Abscess    atorvastatin  20 mg Oral Daily   Chlorhexidine  Gluconate Cloth  6 each Topical Daily   docusate sodium  100 mg Oral BID   famotidine  20 mg Oral Daily   heparin injection (subcutaneous)  5,000 Units Subcutaneous Q8H   indomethacin  25 mg Oral BID WC   metoprolol tartrate  50 mg Oral BID   oxyCODONE  20 mg Oral Q12H   potassium chloride  20 mEq Oral BID    SUBJECTIVE:  Afebrile overnight with no acute events. UE dopplers with no evidence of DVT. Left hand improved. No fevers/chills. Wife at bedside.   No Known Allergies   Review of Systems: Review of Systems  Constitutional:  Negative for chills, fever and weight loss.  Respiratory:  Negative for cough, shortness of breath and wheezing.   Cardiovascular:  Negative for chest pain and leg swelling.  Gastrointestinal:  Negative for abdominal pain, constipation, diarrhea, nausea and vomiting.  Musculoskeletal:  Positive for back pain.       Left hand swelling  Skin:  Negative for rash.     OBJECTIVE: Vitals:   09/27/21 2333 09/28/21 0338 09/28/21 0801 09/28/21 1125  BP: 106/65 137/79 136/76 106/69  Pulse: 87 87 86 84  Resp: $Remo'17 20 20 19  'JJNlB$ Temp: 98.3 F (36.8 C) 97.7 F (36.5 C) 97.9 F (36.6 C) 98.6 F (37 C)  TempSrc: Oral Oral Oral Oral  SpO2: 96% 90% 98% 97%  Weight:      Height:       Body mass index is 31.32 kg/m.  Physical Exam Constitutional:  General: He is not in acute distress.    Appearance: He is well-developed.     Comments: Seated in the chair beside the bed; pleasant. Lumbar brace in place.   Cardiovascular:     Rate and Rhythm: Normal rate and regular rhythm.     Heart sounds: Normal heart sounds.  Pulmonary:     Effort: Pulmonary effort is normal.     Breath sounds: Normal breath sounds.  Musculoskeletal:     Comments: Left hand with mildly improved edema and increased function/strength.   Skin:    General: Skin is warm and dry.  Neurological:     Mental Status: He is alert.  Psychiatric:        Mood and Affect: Mood normal.     Lab Results Lab Results  Component Value Date   WBC 4.6 09/28/2021   HGB 8.1 (L) 09/28/2021   HCT 25.7 (L) 09/28/2021   MCV 89.5 09/28/2021   PLT 371 09/28/2021    Lab Results  Component Value Date   CREATININE 0.70 09/28/2021   BUN 5 (L) 09/28/2021   NA 135 09/28/2021   K 3.6 09/28/2021   CL 103 09/28/2021   CO2 27 09/28/2021    Lab Results  Component Value Date   ALT 7 09/22/2021   AST 24 09/22/2021   ALKPHOS 66 09/22/2021   BILITOT 0.6 09/22/2021     Microbiology: Recent Results (from the past 240 hour(s))  Blood culture (routine x 2)     Status: None   Collection Time: 09/23/21 12:53 AM   Specimen: BLOOD  Result Value Ref Range Status   Specimen Description BLOOD LEFT ANTECUBITAL  Final   Special Requests   Final    BOTTLES DRAWN AEROBIC AND ANAEROBIC Blood Culture adequate volume   Culture   Final    NO GROWTH 5 DAYS Performed at [facility], [address], [place], [place] [address]    Report Status 09/28/2021 FINAL  Final  Blood culture (routine x 2)     Status: None   Collection Time: 09/23/21  1:06 AM   Specimen: BLOOD RIGHT HAND  Result Value Ref Range Status   Specimen Description BLOOD RIGHT HAND  Final   Special Requests   Final    BOTTLES DRAWN AEROBIC AND ANAEROBIC Blood Culture adequate volume   Culture   Final    NO GROWTH 5 DAYS Performed at [facility], [place] [address] [place], [place] [address]    Report Status 09/28/2021 FINAL  Final  Aerobic/Anaerobic Culture w Gram Stain (surgical/deep wound)     Status: None   Collection Time: 09/23/21  3:41 AM   Specimen: Wound  Result Value Ref Range Status   Specimen Description WOUND  Final   Special Requests NONE  Final   Gram Stain NO WBC SEEN NO ORGANISMS SEEN   Final   Culture   Final    No growth aerobically or anaerobically. Performed at [facility], [place] [address] [place], [place] [address]    Report Status 09/28/2021 FINAL  Final  Surgical pcr screen      Status: None   Collection Time: 09/23/21  8:38 PM   Specimen: Nasal Mucosa; Nasal Swab  Result Value Ref Range Status   MRSA, PCR NEGATIVE NEGATIVE Final   Staphylococcus aureus NEGATIVE NEGATIVE Final    Comment: (NOTE) The Xpert SA Assay (FDA approved for NASAL specimens in patients 67 years of age and older), is one  component of a comprehensive surveillance program. It is not intended to diagnose infection nor to guide or monitor treatment. Performed at [facility], [place] [address] [place], [place] [address]   Aerobic/Anaerobic Culture w Gram Stain (surgical/deep wound)     Status: None (Preliminary result)   Collection Time: 09/24/21  2:50 PM   Specimen: Wound  Result Value Ref Range Status   Specimen Description WOUND  Final   Special Requests LUMBAR  Final   Gram Stain   Final    MODERATE WBC PRESENT, PREDOMINANTLY PMN NO ORGANISMS SEEN    Culture   Final    NO GROWTH 4 DAYS NO ANAEROBES ISOLATED; CULTURE IN PROGRESS FOR 5 DAYS Performed at [facility], [place] [address] [place], [place] [address]    Report Status PENDING  Incomplete     Terri Piedra, [place] [facility]  09/28/2021  1:22 PM

## 2021-09-28 NOTE — Progress Notes (Signed)
PROGRESS NOTE    Michael Hill  PJA:[id number] DOB: [birth year] DOA: 09/22/2021 PCP: Tally Joe, MD    Brief Narrative:  Hypertension, hyperlipidemia, GERD, spinal stenosis and lumbar radiculopathy status post L5-S1 decompression fusion on 9/21, admitted 10/5-10/12 for infection of recent surgical site status post I&D on 10/5, acute hypoxemic respiratory failure due to COVID infection, complete hospital course due to severe sepsis, MSSA bacteremia and discharged home on IV Ancef and oral rifampin brought back to ER with worsening back pain, right leg weakness and drainage from the wound.  10/21, lumbar wound I&D with wound VAC placement and continued on Ancef.  Followed by infectious disease and neurosurgery. Patient had developed left hand swelling that is getting better now.  Assessment & Plan:   Principal Problem:   Postoperative wound infection Active Problems:   HYPERCHOLESTEROLEMIA   Hyponatremia   Anemia   Painful hand   MSSA bacteremia   Abscess  Postoperative wound infection/recent MSSA bacteremia with severe sepsis: Repeat blood cultures negative. Repeat lumbar wound I&D and wound VAC placement 10/21, cultures negative so far. Followed by ID and neurosurgery, anticipate prolonged antibiotics.  ID to finalize antibiotic duration.  Surgery cleared for PT OT.  Refer to rehab. Pain management remains issue.  Currently on oxycodone 10 mg every 12 hours as well as IV Dilaudid and oral oxycodone and Tylenol. Referred to CIR, however patient may want to go home. Neurosurgery planning to close the wound before discharge.  Sinus tachycardia with history of SVT: Widely fluctuating heart rate.  Mostly SVT/PAT's. Heart rate controlled today on increasing dose of metoprolol.  Sinus rhythm.  Hyperlipidemia: On statin.  Left hand pain: Diffuse swelling after surgery.  X-ray consistent with osteoarthritic changes.  No evidence of infection. Uric acid level was  normal. Significantly improved today. Cold compress/NSAIDs with indomethacin.   Hypokalemia: Replaced and adequate.   DVT prophylaxis: heparin injection 5,000 Units Start: 09/25/21 0600 SCDs Start: 09/23/21 0532   Code Status: Full code Family Communication: None at the bedside Disposition Plan: Status is: Inpatient  Remains inpatient appropriate because: Active spinal wound on IV antibiotics. Possible wound closure before discharge.         Consultants:  Infectious disease Neurosurgery  Procedures:  I&D spinal wound 10/21  Antimicrobials:  Nafcillin 10/5--10/24 Ancef 10/24---   Subjective: Patient seen and examined.  Complains of poor sleep at night.  Frustrated being in the hospital. Does not want to go to CIR.  He prefers to go home if his wound is closed. Using his left hand better now.  Able to make grip.  He thinks the indomethacin helped.  Objective: Vitals:   09/27/21 2053 09/27/21 2333 09/28/21 0338 09/28/21 0801  BP: 118/66 106/65 137/79 136/76  Pulse: 92 87 87 86  Resp: 20 17 20 20   Temp: 98.6 F (37 C) 98.3 F (36.8 C) 97.7 F (36.5 C) 97.9 F (36.6 C)  TempSrc: Oral Oral Oral Oral  SpO2: 93% 96% 90% 98%  Weight:      Height:        Intake/Output Summary (Last 24 hours) at 09/28/2021 1117 Last data filed at 09/27/2021 1505 Gross per 24 hour  Intake 500 ml  Output --  Net 500 ml   Filed Weights   09/22/21 1934 09/24/21 1323  Weight: 90.7 kg 90.7 kg    Examination:  General exam: Appears calm and comfortable .  On room air. Respiratory system: Clear to auscultation. Respiratory effort normal.  No added sounds. Cardiovascular system:  S1 & S2 heard, RRR. No JVD, murmurs, rubs, gallops or clicks. No pedal edema.  Heart rate is well controlled at this time. Gastrointestinal system: Abdomen is nondistended, soft and nontender. No organomegaly or masses felt. Normal bowel sounds heard. Central nervous system: Alert and oriented. No focal  neurological deficits. Extremities:  Patient has diffuse swelling mostly on the dorsum of the left hand with no localized erythema or redness.  He has pitting edema. Able to make good grip today with no obvious pain. Wound VAC fitted on the, picture available in the chart.     Data Reviewed: I have personally reviewed following labs and imaging studies  CBC: Recent Labs  Lab 09/22/21 2045 09/23/21 0605 09/25/21 1005 09/26/21 0217 09/27/21 0412 09/28/21 0258  WBC 8.7 9.2 6.9 6.1 5.3 4.6  NEUTROABS 5.9  --   --   --   --   --   HGB 9.8* 9.7* 9.4* 8.5* 8.4* 8.1*  HCT 30.9* 30.9* 29.6* 26.6* 27.3* 25.7*  MCV 89.8 92.0 88.9 87.5 91.0 89.5  PLT 314 352 331 341 383 371   Basic Metabolic Panel: Recent Labs  Lab 09/23/21 0605 09/25/21 1005 09/26/21 0217 09/27/21 0412 09/28/21 0258  NA 130* 135 133* 134* 135  K 4.6 3.5 3.6 3.3* 3.6  CL 94* 100 101 101 103  CO2 24 26 25 25 27   GLUCOSE 109* 192* 139* 126* 125*  BUN 10 6* 5* <5* 5*  CREATININE 0.88 0.76 0.66 0.61 0.70  CALCIUM 8.6* 7.8* 7.5* 7.7* 7.9*  MG  --   --   --   --  1.8  PHOS  --   --   --   --  2.8   GFR: Estimated Creatinine Clearance: 93.6 mL/min (by C-G formula based on SCr of 0.7 mg/dL). Liver Function Tests: Recent Labs  Lab 09/22/21 2045  AST 24  ALT 7  ALKPHOS 66  BILITOT 0.6  PROT 6.2*  ALBUMIN 2.0*   No results for input(s): LIPASE, AMYLASE in the last 168 hours. No results for input(s): AMMONIA in the last 168 hours. Coagulation Profile: No results for input(s): INR, PROTIME in the last 168 hours. Cardiac Enzymes: No results for input(s): CKTOTAL, CKMB, CKMBINDEX, TROPONINI in the last 168 hours. BNP (last 3 results) No results for input(s): PROBNP in the last 8760 hours. HbA1C: No results for input(s): HGBA1C in the last 72 hours. CBG: No results for input(s): GLUCAP in the last 168 hours. Lipid Profile: No results for input(s): CHOL, HDL, LDLCALC, TRIG, CHOLHDL, LDLDIRECT in the last 72  hours. Thyroid Function Tests: Recent Labs    09/27/21 0412  TSH 1.914   Anemia Panel: No results for input(s): VITAMINB12, FOLATE, FERRITIN, TIBC, IRON, RETICCTPCT in the last 72 hours. Sepsis Labs: No results for input(s): PROCALCITON, LATICACIDVEN in the last 168 hours.  Recent Results (from the past 240 hour(s))  Blood culture (routine x 2)     Status: None   Collection Time: 09/23/21 12:53 AM   Specimen: BLOOD  Result Value Ref Range Status   Specimen Description BLOOD LEFT ANTECUBITAL  Final   Special Requests   Final    BOTTLES DRAWN AEROBIC AND ANAEROBIC Blood Culture adequate volume   Culture   Final    NO GROWTH 5 DAYS Performed at [facility], [address] [place], [place]    Report Status 09/28/2021 FINAL  Final  Blood culture (routine x 2)     Status: None  Collection Time: 09/23/21  1:06 AM   Specimen: BLOOD RIGHT HAND  Result Value Ref Range Status   Specimen Description BLOOD RIGHT HAND  Final   Special Requests   Final    BOTTLES DRAWN AEROBIC AND ANAEROBIC Blood Culture adequate volume   Culture   Final    NO GROWTH 5 DAYS Performed at [facility], [address], [place], [place] [address]    Report Status 09/28/2021 FINAL  Final  Aerobic/Anaerobic Culture w Gram Stain (surgical/deep wound)     Status: None (Preliminary result)   Collection Time: 09/23/21  3:41 AM   Specimen: Wound  Result Value Ref Range Status   Specimen Description WOUND  Final   Special Requests NONE  Final   Gram Stain NO WBC SEEN NO ORGANISMS SEEN   Final   Culture   Final    NO GROWTH 4 DAYS NO ANAEROBES ISOLATED; CULTURE IN PROGRESS FOR 5 DAYS Performed at [facility], [address] [place], [place] [address]    Report Status PENDING  Incomplete  Surgical pcr screen     Status: None   Collection Time: 09/23/21  8:38 PM   Specimen: Nasal Mucosa; Nasal Swab  Result Value Ref Range Status   MRSA, PCR NEGATIVE NEGATIVE Final    Staphylococcus aureus NEGATIVE NEGATIVE Final    Comment: (NOTE) The Xpert SA Assay (FDA approved for NASAL specimens in patients 76 years of age and older), is one component of a comprehensive surveillance program. It is not intended to diagnose infection nor to guide or monitor treatment. Performed at [facility], [address], [place], [place] [address]   Aerobic/Anaerobic Culture w Gram Stain (surgical/deep wound)     Status: None (Preliminary result)   Collection Time: 09/24/21  2:50 PM   Specimen: Wound  Result Value Ref Range Status   Specimen Description WOUND  Final   Special Requests LUMBAR  Final   Gram Stain   Final    MODERATE WBC PRESENT, PREDOMINANTLY PMN NO ORGANISMS SEEN    Culture   Final    NO GROWTH 3 DAYS NO ANAEROBES ISOLATED; CULTURE IN PROGRESS FOR 5 DAYS Performed at [facility], [address], [place], [place] [address]    Report Status PENDING  Incomplete         Radiology Studies: VAS [place] UPPER EXTREMITY VENOUS DUPLEX  Result Date: 09/27/2021 UPPER VENOUS STUDY  Patient Name:  ODAS OZER  Date of Exam:   09/27/2021 Medical Rec #: [id number]        Accession #:    [id number] Date of Birth: 02-12-1951       Patient Gender: M Patient Age:   70 years Exam Location:  [facility] Procedure:      VAS [place] UPPER EXTREMITY VENOUS DUPLEX Referring Phys: Hoyt Koch --------------------------------------------------------------------------------  Indications: Pain, and Swelling of left hand post op Limitations: Patient in chair. Comparison Study: No prior study Performing Technologist: Sherren Kerns RVS  Examination Guidelines: A complete evaluation includes B-mode imaging, spectral Doppler, color Doppler, and power Doppler as needed of all accessible portions of each vessel. Bilateral testing is considered an integral part of a complete examination. Limited examinations for reoccurring indications may be performed as noted.  Right  Findings: +----------+------------+---------+-----------+----------+-------+ RIGHT     CompressiblePhasicitySpontaneousPropertiesSummary +----------+------------+---------+-----------+----------+-------+ Subclavian               Yes       Yes                      +----------+------------+---------+-----------+----------+-------+  Left Findings: +----------+------------+---------+-----------+----------+-------+ LEFT      CompressiblePhasicitySpontaneousPropertiesSummary +----------+------------+---------+-----------+----------+-------+ IJV                      Yes       Yes                      +----------+------------+---------+-----------+----------+-------+ Subclavian               Yes       Yes                      +----------+------------+---------+-----------+----------+-------+ Axillary                 Yes       Yes                      +----------+------------+---------+-----------+----------+-------+ Brachial      Full                                          +----------+------------+---------+-----------+----------+-------+ Radial        Full                                          +----------+------------+---------+-----------+----------+-------+ Ulnar         Full                                          +----------+------------+---------+-----------+----------+-------+ Cephalic      Full                                          +----------+------------+---------+-----------+----------+-------+ Basilic       Full                                          +----------+------------+---------+-----------+----------+-------+  Summary:  Right: No evidence of thrombosis in the subclavian.  Left: No evidence of deep vein thrombosis in the upper extremity. No evidence of superficial vein thrombosis in the upper extremity. Interstitial fluid noted left forearm and hand.  *See table(s) above for measurements and observations.  Diagnosing  physician: Sherald Hess MD Electronically signed by Sherald Hess MD on 09/27/2021 at 3:49:58 PM.    Final         Scheduled Meds:  atorvastatin  20 mg Oral Daily   Chlorhexidine Gluconate Cloth  6 each Topical Daily   docusate sodium  100 mg Oral BID   famotidine  20 mg Oral Daily   heparin injection (subcutaneous)  5,000 Units Subcutaneous Q8H   indomethacin  25 mg Oral BID WC   metoprolol tartrate  50 mg Oral BID   oxyCODONE  20 mg Oral Q12H   potassium chloride  20 mEq Oral BID   Continuous Infusions:   ceFAZolin (ANCEF) IV 2 g (09/28/21 0530)     LOS: 5 days    Time spent: 34 minutes    Dorcas Carrow, MD [facility]  Pager [phone]

## 2021-09-29 DIAGNOSIS — T8149XA Infection following a procedure, other surgical site, initial encounter: Secondary | ICD-10-CM | POA: Diagnosis not present

## 2021-09-29 LAB — AEROBIC/ANAEROBIC CULTURE W GRAM STAIN (SURGICAL/DEEP WOUND): Culture: NO GROWTH

## 2021-09-29 MED ORDER — SODIUM CHLORIDE 0.9 % IV SOLN: INTRAVENOUS | Status: DC | PRN

## 2021-09-29 NOTE — Progress Notes (Signed)
Subjective: Patient reports that he is in better spirits today and was able to get some sleep last night. He is appreciative of the changes made to his medications yesterday. No acute events overnight.   Objective: Vital signs in last 24 hours: Temp:  [97.9 F (36.6 C)-98.6 F (37 C)] 97.9 F (36.6 C) (10/26 0737) Pulse Rate:  [78-88] 83 (10/26 0737) Resp:  [14-20] 15 (10/26 0737) BP: (106-138)/(64-90) 123/69 (10/26 0737) SpO2:  [95 %-98 %] 95 % (10/26 0737)  Intake/Output from previous day: 10/25 0701 - 10/26 0700 In: 1220 [P.O.:1220] Out: 250 [Urine:250] Intake/Output this shift: No intake/output data recorded.  Physical Exam: Patient is awake, A/O X 4, conversant, and in good spirits. They are in NAD and VSS. Doing well. Speech is fluent and appropriate. MAEW with good strength that is symmetric bilaterally. 5/5 BUE/BLE. Sensation to light touch is intact. PERLA, EOMI. CNs grossly intact. Wound vac in place and is CDI.    Lab Results: Recent Labs    09/27/21 0412 09/28/21 0258  WBC 5.3 4.6  HGB 8.4* 8.1*  HCT 27.3* 25.7*  PLT 383 371   BMET Recent Labs    09/27/21 0412 09/28/21 0258  NA 134* 135  K 3.3* 3.6  CL 101 103  CO2 25 27  GLUCOSE 126* 125*  BUN <5* 5*  CREATININE 0.61 0.70  CALCIUM 7.7* 7.9*    Studies/Results: VAS [place] UPPER EXTREMITY VENOUS DUPLEX  Result Date: 09/27/2021 UPPER VENOUS STUDY  Patient Name:  Michael Hill  Date of Exam:   09/27/2021 Medical Rec #: [id number]        Accession #:    [id number] Date of Birth: 01/04/1951       Patient Gender: M Patient Age:   70 years Exam Location:  [facility] Procedure:      VAS [place] UPPER EXTREMITY VENOUS DUPLEX Referring Phys: Hoyt Koch --------------------------------------------------------------------------------  Indications: Pain, and Swelling of left hand post op Limitations: Patient in chair. Comparison Study: No prior study Performing Technologist: Sherren Kerns RVS  Examination  Guidelines: A complete evaluation includes B-mode imaging, spectral Doppler, color Doppler, and power Doppler as needed of all accessible portions of each vessel. Bilateral testing is considered an integral part of a complete examination. Limited examinations for reoccurring indications may be performed as noted.  Right Findings: +----------+------------+---------+-----------+----------+-------+ RIGHT     CompressiblePhasicitySpontaneousPropertiesSummary +----------+------------+---------+-----------+----------+-------+ Subclavian               Yes       Yes                      +----------+------------+---------+-----------+----------+-------+  Left Findings: +----------+------------+---------+-----------+----------+-------+ LEFT      CompressiblePhasicitySpontaneousPropertiesSummary +----------+------------+---------+-----------+----------+-------+ IJV                      Yes       Yes                      +----------+------------+---------+-----------+----------+-------+ Subclavian               Yes       Yes                      +----------+------------+---------+-----------+----------+-------+ Axillary                 Yes       Yes                      +----------+------------+---------+-----------+----------+-------+  Brachial      Full                                          +----------+------------+---------+-----------+----------+-------+ Radial        Full                                          +----------+------------+---------+-----------+----------+-------+ Ulnar         Full                                          +----------+------------+---------+-----------+----------+-------+ Cephalic      Full                                          +----------+------------+---------+-----------+----------+-------+ Basilic       Full                                           +----------+------------+---------+-----------+----------+-------+  Summary:  Right: No evidence of thrombosis in the subclavian.  Left: No evidence of deep vein thrombosis in the upper extremity. No evidence of superficial vein thrombosis in the upper extremity. Interstitial fluid noted left forearm and hand.  *See table(s) above for measurements and observations.  Diagnosing physician: Sherald Hess MD Electronically signed by Sherald Hess MD on 09/27/2021 at 3:49:58 PM.    Final     Assessment/Plan: Patient is s/p Incision and debridement of lumbar wound and wound vac placement. He is continuing to improve. Neuro exam unchanged. Left hand pain and swelling continues to improve. His mood has improved after being able to obtain some rest overnight. Will reassess readiness for wound closure latter in the week.      -Abx per ID, ID signing off, continue cefazolin followed by protracted Keflex -Continue Wound vac  -Please record wound vac output every shift -Continue working with PT/OT -Encourage mobility  -Continue working on pain control  LOS: 6 days     Council Mechanic, DNP, NP-C 09/29/2021, 8:13 AM

## 2021-09-29 NOTE — Consult Note (Signed)
WOC follow-up:  Discussed plan of care with neurosurgical PA at the bedside. Their team plans to remove Vac dressing and assess wound appearance later today.  I will plan to re-apply Vac dressing after that occurs. Thank-you,  Cammie Mcgee MSN, RN, CWOCN, [place], CNS [phone]

## 2021-09-29 NOTE — Progress Notes (Signed)
PROGRESS NOTE    Michael Hill  GUR:[id number] DOB: December 10, 1950 DOA: 09/22/2021 PCP: Tally Joe, MD    Brief Narrative:  Hypertension, hyperlipidemia, GERD, spinal stenosis and lumbar radiculopathy status post L5-S1 decompression fusion on 9/21, admitted 10/5-10/12 for infection of recent surgical site status post I&D on 10/5, acute hypoxemic respiratory failure due to COVID infection, complete hospital course due to severe sepsis, MSSA bacteremia and discharged home on IV Ancef and oral rifampin brought back to ER with worsening back pain, right leg weakness and drainage from the wound.  10/21, lumbar wound I&D with wound VAC placement and continued on Ancef.  Followed by infectious disease and neurosurgery. Patient had developed left hand swelling that is getting better now.  Assessment & Plan:   Principal Problem:   Postoperative wound infection Active Problems:   HYPERCHOLESTEROLEMIA   Hyponatremia   Anemia   Painful hand   MSSA bacteremia   Abscess   Hardware complicating wound infection (HCC)   Anorexia  Postoperative wound infection/recent MSSA bacteremia with severe sepsis: Repeat blood cultures negative. Repeat lumbar wound I&D and wound VAC placement 10/21, cultures negative so far. Followed by ID and neurosurgery, anticipate prolonged antibiotics.  ID to finalize antibiotic duration.  Surgery cleared for PT OT.  Refer to rehab. Pain management remains issue.  Currently on oxycodone 10 mg every 12 hours as well as IV Dilaudid and oral oxycodone and Tylenol. Neurosurgery planning to close the wound before discharge.  Sinus tachycardia with history of SVT: Widely fluctuating heart rate.  Mostly SVT/PAT's. Heart rate controlled today on increasing dose of metoprolol.  Sinus rhythm.  Hyperlipidemia: On statin.  Left hand pain: Diffuse swelling after surgery.  X-ray consistent with osteoarthritic changes.  No evidence of infection. Uric acid level was  normal. Significantly improved today. Cold compress/NSAIDs with indomethacin.   Hypokalemia: Replaced and adequate.   DVT prophylaxis: heparin injection 5,000 Units Start: 09/25/21 0600 SCDs Start: 09/23/21 0532   Code Status: Full code Family Communication: None at the bedside Disposition Plan: Status is: Inpatient  Remains inpatient appropriate because: Active spinal wound on IV antibiotics. Possible wound closure before discharge.         Consultants:  Infectious disease Neurosurgery  Procedures:  I&D spinal wound 10/21  Antimicrobials:  Nafcillin 10/5--10/24 Ancef 10/24---   Subjective: Patient was seen and examined.  No overnight events.  He was very happy today to get a good sleep with Ambien and it also helped with not using pain medications at night.  He is looking forward for wound closure and going home.  Objective: Vitals:   09/28/21 2320 09/29/21 0424 09/29/21 0737 09/29/21 1130  BP: 120/78 128/90 123/69 124/73  Pulse: 84 83 83 81  Resp: 17 20 15 19   Temp: 98.3 F (36.8 C) 97.9 F (36.6 C) 97.9 F (36.6 C) 97.8 F (36.6 C)  TempSrc: Oral Oral Oral Oral  SpO2: 95% 98% 95% 96%  Weight:      Height:        Intake/Output Summary (Last 24 hours) at 09/29/2021 1242 Last data filed at 09/29/2021 0940 Gross per 24 hour  Intake 1020 ml  Output 250 ml  Net 770 ml   Filed Weights   09/22/21 1934 09/24/21 1323  Weight: 90.7 kg 90.7 kg    Examination:  General exam: Appears calm and comfortable .  On room air.  Sitting in chair. Respiratory system: Clear to auscultation. Respiratory effort normal.  No added sounds. Cardiovascular system: S1 & S2  heard, RRR. No JVD, murmurs, rubs, gallops or clicks. No pedal edema.  Heart rate is well controlled at this time. Gastrointestinal system: Abdomen is nondistended, soft and nontender. No organomegaly or masses felt. Normal bowel sounds heard. Central nervous system: Alert and oriented. No focal  neurological deficits. Extremities:  Mild swelling of the left hand.  No rigidity or erythema. Lumbar wound with wound VAC in place, serous fluid on the canister.     Data Reviewed: I have personally reviewed following labs and imaging studies  CBC: Recent Labs  Lab 09/22/21 2045 09/23/21 0605 09/25/21 1005 09/26/21 0217 09/27/21 0412 09/28/21 0258  WBC 8.7 9.2 6.9 6.1 5.3 4.6  NEUTROABS 5.9  --   --   --   --   --   HGB 9.8* 9.7* 9.4* 8.5* 8.4* 8.1*  HCT 30.9* 30.9* 29.6* 26.6* 27.3* 25.7*  MCV 89.8 92.0 88.9 87.5 91.0 89.5  PLT 314 352 331 341 383 371   Basic Metabolic Panel: Recent Labs  Lab 09/23/21 0605 09/25/21 1005 09/26/21 0217 09/27/21 0412 09/28/21 0258  NA 130* 135 133* 134* 135  K 4.6 3.5 3.6 3.3* 3.6  CL 94* 100 101 101 103  CO2 24 26 25 25 27   GLUCOSE 109* 192* 139* 126* 125*  BUN 10 6* 5* <5* 5*  CREATININE 0.88 0.76 0.66 0.61 0.70  CALCIUM 8.6* 7.8* 7.5* 7.7* 7.9*  MG  --   --   --   --  1.8  PHOS  --   --   --   --  2.8   GFR: Estimated Creatinine Clearance: 93.6 mL/min (by C-G formula based on SCr of 0.7 mg/dL). Liver Function Tests: Recent Labs  Lab 09/22/21 2045  AST 24  ALT 7  ALKPHOS 66  BILITOT 0.6  PROT 6.2*  ALBUMIN 2.0*   No results for input(s): LIPASE, AMYLASE in the last 168 hours. No results for input(s): AMMONIA in the last 168 hours. Coagulation Profile: No results for input(s): INR, PROTIME in the last 168 hours. Cardiac Enzymes: No results for input(s): CKTOTAL, CKMB, CKMBINDEX, TROPONINI in the last 168 hours. BNP (last 3 results) No results for input(s): PROBNP in the last 8760 hours. HbA1C: No results for input(s): HGBA1C in the last 72 hours. CBG: No results for input(s): GLUCAP in the last 168 hours. Lipid Profile: No results for input(s): CHOL, HDL, LDLCALC, TRIG, CHOLHDL, LDLDIRECT in the last 72 hours. Thyroid Function Tests: Recent Labs    09/27/21 0412  TSH 1.914   Anemia Panel: No results for  input(s): VITAMINB12, FOLATE, FERRITIN, TIBC, IRON, RETICCTPCT in the last 72 hours. Sepsis Labs: No results for input(s): PROCALCITON, LATICACIDVEN in the last 168 hours.  Recent Results (from the past 240 hour(s))  Blood culture (routine x 2)     Status: None   Collection Time: 09/23/21 12:53 AM   Specimen: BLOOD  Result Value Ref Range Status   Specimen Description BLOOD LEFT ANTECUBITAL  Final   Special Requests   Final    BOTTLES DRAWN AEROBIC AND ANAEROBIC Blood Culture adequate volume   Culture   Final    NO GROWTH 5 DAYS Performed at [facility], [address], [place], [place]    Report Status 09/28/2021 FINAL  Final  Blood culture (routine x 2)     Status: None   Collection Time: 09/23/21  1:06 AM   Specimen: BLOOD RIGHT HAND  Result Value Ref Range Status   Specimen Description  BLOOD RIGHT HAND  Final   Special Requests   Final    BOTTLES DRAWN AEROBIC AND ANAEROBIC Blood Culture adequate volume   Culture   Final    NO GROWTH 5 DAYS Performed at [facility], [address], [place], [place] [address]    Report Status 09/28/2021 FINAL  Final  Aerobic/Anaerobic Culture w Gram Stain (surgical/deep wound)     Status: None   Collection Time: 09/23/21  3:41 AM   Specimen: Wound  Result Value Ref Range Status   Specimen Description WOUND  Final   Special Requests NONE  Final   Gram Stain NO WBC SEEN NO ORGANISMS SEEN   Final   Culture   Final    No growth aerobically or anaerobically. Performed at [facility], [address], [place], [place] [address]    Report Status 09/28/2021 FINAL  Final  Surgical pcr screen     Status: None   Collection Time: 09/23/21  8:38 PM   Specimen: Nasal Mucosa; Nasal Swab  Result Value Ref Range Status   MRSA, PCR NEGATIVE NEGATIVE Final   Staphylococcus aureus NEGATIVE NEGATIVE Final    Comment: (NOTE) The Xpert SA Assay (FDA approved for NASAL specimens in patients 71 years of age and older), is  one component of a comprehensive surveillance program. It is not intended to diagnose infection nor to guide or monitor treatment. Performed at [facility], [address] [place], [place] [address]   Aerobic/Anaerobic Culture w Gram Stain (surgical/deep wound)     Status: None (Preliminary result)   Collection Time: 09/24/21  2:50 PM   Specimen: Wound  Result Value Ref Range Status   Specimen Description WOUND  Final   Special Requests LUMBAR  Final   Gram Stain   Final    MODERATE WBC PRESENT, PREDOMINANTLY PMN NO ORGANISMS SEEN    Culture   Final    NO GROWTH 4 DAYS NO ANAEROBES ISOLATED; CULTURE IN PROGRESS FOR 5 DAYS Performed at [facility], [address], [place], [place] [address]    Report Status PENDING  Incomplete         Radiology Studies: VAS [place] UPPER EXTREMITY VENOUS DUPLEX  Result Date: 09/27/2021 UPPER VENOUS STUDY  Patient Name:  MADISON ALBEA  Date of Exam:   09/27/2021 Medical Rec #: [id number]        Accession #:    [id number] Date of Birth: [birth year]       Patient Gender: M Patient Age:   39 years Exam Location:  [facility] Procedure:      VAS [place] UPPER EXTREMITY VENOUS DUPLEX Referring Phys: Hoyt Koch --------------------------------------------------------------------------------  Indications: Pain, and Swelling of left hand post op Limitations: Patient in chair. Comparison Study: No prior study Performing Technologist: Sherren Kerns RVS  Examination Guidelines: A complete evaluation includes B-mode imaging, spectral Doppler, color Doppler, and power Doppler as needed of all accessible portions of each vessel. Bilateral testing is considered an integral part of a complete examination. Limited examinations for reoccurring indications may be performed as noted.  Right Findings: +----------+------------+---------+-----------+----------+-------+ RIGHT     CompressiblePhasicitySpontaneousPropertiesSummary  +----------+------------+---------+-----------+----------+-------+ Subclavian               Yes       Yes                      +----------+------------+---------+-----------+----------+-------+  Left Findings: +----------+------------+---------+-----------+----------+-------+ LEFT  CompressiblePhasicitySpontaneousPropertiesSummary +----------+------------+---------+-----------+----------+-------+ IJV                      Yes       Yes                      +----------+------------+---------+-----------+----------+-------+ Subclavian               Yes       Yes                      +----------+------------+---------+-----------+----------+-------+ Axillary                 Yes       Yes                      +----------+------------+---------+-----------+----------+-------+ Brachial      Full                                          +----------+------------+---------+-----------+----------+-------+ Radial        Full                                          +----------+------------+---------+-----------+----------+-------+ Ulnar         Full                                          +----------+------------+---------+-----------+----------+-------+ Cephalic      Full                                          +----------+------------+---------+-----------+----------+-------+ Basilic       Full                                          +----------+------------+---------+-----------+----------+-------+  Summary:  Right: No evidence of thrombosis in the subclavian.  Left: No evidence of deep vein thrombosis in the upper extremity. No evidence of superficial vein thrombosis in the upper extremity. Interstitial fluid noted left forearm and hand.  *See table(s) above for measurements and observations.  Diagnosing physician: Sherald Hess MD Electronically signed by Sherald Hess MD on 09/27/2021 at 3:49:58 PM.    Final         Scheduled  Meds:  atorvastatin  20 mg Oral Daily   Chlorhexidine Gluconate Cloth  6 each Topical Daily   docusate sodium  100 mg Oral BID   famotidine  20 mg Oral Daily   heparin injection (subcutaneous)  5,000 Units Subcutaneous Q8H   indomethacin  25 mg Oral BID WC   metoprolol tartrate  50 mg Oral BID   oxyCODONE  20 mg Oral Q12H   Continuous Infusions:   ceFAZolin (ANCEF) IV 2 g (09/29/21 0525)     LOS: 6 days    Time spent: 34 minutes    Dorcas Carrow, MD [facility] Pager [phone]

## 2021-09-29 NOTE — Progress Notes (Signed)
Physical Therapy Treatment Patient Details Name: Michael Hill MRN: [id number] DOB: Mar 16, 1951 Today's Date: 09/29/2021   History of Present Illness 70 y/o male presented to ED on 10/19 for increasing back pain, R leg weakness, and worsening mobility. Initial L5-S1 decompression/fusion with extension to pelvis on 08/25/21. Patient with recent admission 10/5-10/11/22 for infection of recent surgical site s/p I&D on 10/5. MRI showed large fluid collection in soft tissues of the back which likely extends to posterior aspect of thecal sac at L5-S1. S/p lumbar wound I&D with wound VAC placement on 10/21. PMH: HTN, HLD, chronic back pain, GERD    PT Comments    Pt progressing very well with mobility, ambulatory 500+ ft with RW and supervision level assist only. Pt with brief period of tachycardia to 160 bpm, recovered to low 100s with standing rest break x1 minute, RN aware. PT feels pt is appropriate for HHPT and d/c home after acute setting, pt's mobility is much improved and pt's wife is able to help as needed. PT to continue to follow acutely.    Recommendations for follow up therapy are one component of a multi-disciplinary discharge planning process, led by the attending physician.  Recommendations may be updated based on patient status, additional functional criteria and insurance authorization.  Follow Up Recommendations  Home health PT     Assistance Recommended at Discharge Frequent or constant Supervision/Assistance  Equipment Recommendations  Rolling walker (2 wheels)    Recommendations for Other Services Rehab consult;OT consult     Precautions / Restrictions Precautions Precautions: Fall;Back Precaution Booklet Issued: No Precaution Comments: Able to recall back precautions Required Braces or Orthoses: Spinal Brace Spinal Brace: Lumbar corset;Applied in sitting position     Mobility  Bed Mobility               General bed mobility comments: up in chair     Transfers Overall transfer level: Modified independent Equipment used: Rolling walker (2 wheels)               General transfer comment: mod I for use of RW and increased time    Ambulation/Gait Ambulation/Gait assistance: Supervision Gait Distance (Feet): 500 Feet Assistive device: Rolling walker (2 wheels) Gait Pattern/deviations: Step-through pattern;Decreased stride length;Trunk flexed Gait velocity: decr   General Gait Details: supervision for safety, verbal cuing for upright posture (chest up, tuck your buttocks in). Standing rest breaks x2   Stairs             Wheelchair Mobility    Modified Rankin (Stroke Patients Only)       Balance Overall balance assessment: Needs assistance Sitting-balance support: No upper extremity supported;Feet supported Sitting balance-Leahy Scale: Good     Standing balance support: Bilateral upper extremity supported;During functional activity Standing balance-Leahy Scale: Fair                              Cognition Arousal/Alertness: Awake/alert Behavior During Therapy: WFL for tasks assessed/performed Overall Cognitive Status: Within Functional Limits for tasks assessed                                          Exercises      General Comments        Pertinent Vitals/Pain Pain Assessment: Faces Faces Pain Scale: Hurts a little bit Pain Location: back Pain Descriptors / Indicators:  Discomfort;Operative site guarding Pain Intervention(s): Limited activity within patient's tolerance;Monitored during session    Home Living Family/patient expects to be discharged to:: Private residence Living Arrangements: Spouse/significant other;Children                      Prior Function            PT Goals (current goals can now be found in the care plan section) Acute Rehab PT Goals Patient Stated Goal: to get stronger and build confidence PT Goal Formulation: With  patient/family Time For Goal Achievement: 10/09/21 Potential to Achieve Goals: Good Progress towards PT goals: Progressing toward goals    Frequency    Min 5X/week      PT Plan Current plan remains appropriate    Co-evaluation              AM-PAC PT "6 Clicks" Mobility   Outcome Measure  Help needed turning from your back to your side while in a flat bed without using bedrails?: A Little Help needed moving from lying on your back to sitting on the side of a flat bed without using bedrails?: A Little Help needed moving to and from a bed to a chair (including a wheelchair)?: A Little Help needed standing up from a chair using your arms (e.g., wheelchair or bedside chair)?: A Little Help needed to walk in hospital room?: A Little Help needed climbing 3-5 steps with a railing? : A Little 6 Click Score: 18    End of Session Equipment Utilized During Treatment: Back brace Activity Tolerance: Patient tolerated treatment well;Patient limited by fatigue Patient left: in chair;with call bell/phone within reach;with chair alarm set;with family/visitor present Nurse Communication: Mobility status PT Visit Diagnosis: Other abnormalities of gait and mobility (R26.89);Muscle weakness (generalized) (M62.81)     Time: 1610-9604 PT Time Calculation (min) (ACUTE ONLY): 24 min  Charges:  $Gait Training: 23-37 mins                     Michael Hill, PT DPT Acute Rehabilitation Services Pager [phone]  Office [phone]    Michael Hill 09/29/2021, 3:33 PM

## 2021-09-29 NOTE — Consult Note (Addendum)
WOC Nurse Consult Note: Discussed plan of care with neurosurgery team via phone call; they do not wish to assess the wound appearance today and told me to change the dressing.  Pt is not due for pain meds until later this afternoon, but he requested I go ahead and perform the dressing change at this time, and he tolerated well with minimal amt discomfort.  Wound type: full thickness post-op wound to middle back Measurement: 14 cm x 4 cm x 6 cm  Wound bed: Beefy red with some white tissue to inner wound bed Drainage (amount, consistency, odor) mod amt pink drainage in the cannister Periwound: intact Dressing procedure/placement/frequency: Applied barrier ring to attempt to maintain a seal around wound edges, then one piece black foam to cont suction.  Bridged track pad to flank to reduce pressure. WOC team will plan to change dressing again on Fri if he does not go to surgery for wound closure.  Thank-you,  Cammie Mcgee MSN, RN, CWOCN, [place], CNS [phone]

## 2021-09-30 DIAGNOSIS — T8149XA Infection following a procedure, other surgical site, initial encounter: Secondary | ICD-10-CM | POA: Diagnosis not present

## 2021-09-30 LAB — CBC WITH DIFFERENTIAL/PLATELET
Abs Immature Granulocytes: 0.02 10*3/uL (ref 0.00–0.07)
Basophils Absolute: 0 10*3/uL (ref 0.0–0.1)
Basophils Relative: 1 %
Eosinophils Absolute: 0.1 10*3/uL (ref 0.0–0.5)
Eosinophils Relative: 3 %
HCT: 28.7 % — ABNORMAL LOW (ref 39.0–52.0)
Hemoglobin: 8.7 g/dL — ABNORMAL LOW (ref 13.0–17.0)
Immature Granulocytes: 0 %
Lymphocytes Relative: 28 %
Lymphs Abs: 1.4 10*3/uL (ref 0.7–4.0)
MCH: 27.9 pg (ref 26.0–34.0)
MCHC: 30.3 g/dL (ref 30.0–36.0)
MCV: 92 fL (ref 80.0–100.0)
Monocytes Absolute: 0.4 10*3/uL (ref 0.1–1.0)
Monocytes Relative: 8 %
Neutro Abs: 3.2 10*3/uL (ref 1.7–7.7)
Neutrophils Relative %: 60 %
Platelets: 504 10*3/uL — ABNORMAL HIGH (ref 150–400)
RBC: 3.12 MIL/uL — ABNORMAL LOW (ref 4.22–5.81)
RDW: 14.6 % (ref 11.5–15.5)
WBC: 5.2 10*3/uL (ref 4.0–10.5)
nRBC: 0 % (ref 0.0–0.2)

## 2021-09-30 LAB — C-REACTIVE PROTEIN: CRP: 8.1 mg/dL — ABNORMAL HIGH (ref <1.0)

## 2021-09-30 LAB — SEDIMENTATION RATE: Sed Rate: 121 mm/h — ABNORMAL HIGH (ref 0–16)

## 2021-09-30 NOTE — Progress Notes (Signed)
Inpatient Rehab Admissions Coordinator:   Note therapy updated recommendations to f/u with home health.  Will sign off at this time.    Estill Dooms, PT, DPT Admissions Coordinator [phone] 09/30/21  9:25 AM

## 2021-09-30 NOTE — TOC Progression Note (Signed)
Transition of Care [facility]) - Progression Note    Patient Details  Name: Michael Hill MRN: [id number] Date of Birth: July 17, 1951  Transition of Care [facility]) CM/SW Contact  Glennon Mac, RN Phone Number: 09/30/2021, 4:47 PM  Clinical Narrative:    Patient will need wound VAC for home; [facility] insurance authorization form signed by NP and forwarded to Blossom Hoops with [employer].  Patient will need continuation of IV antibiotics as prior to admission; he is active with [place] home health for home health RN.  Will notify agency of need for wound VAC changes.                                  DME Arranged: Vac DME Agency: [employer] Date DME Agency Contacted: 09/30/21 Time DME Agency Contacted: [phone] Representative spoke with at DME Agency: Barnie Mort [facility] Arranged: RN           Social Determinants of Health (SDOH) Interventions    Readmission Risk Interventions Readmission Risk Prevention Plan 09/14/2021 09/14/2021 09/13/2021  Transportation Screening - - -  PCP or Specialist Appt within 3-5 Days (No Data) Complete -  HRI or Home Care Consult - - -  Social Work Consult for Recovery Care Planning/Counseling - - Complete  Palliative Care Screening - - Not Applicable  Medication Review (RN Care Manager) - - Complete  Some recent data might be hidden   Quintella Baton, RN, BSN  Trauma/Neuro ICU Case Manager [phone]

## 2021-09-30 NOTE — Progress Notes (Signed)
Subjective: Patient reports that he is doing well. He is in better spirits this morning. Left hand continues to improve.   Objective: Vital signs in last 24 hours: Temp:  [97.6 F (36.4 C)-98.7 F (37.1 C)] 97.6 F (36.4 C) (10/27 0825) Pulse Rate:  [74-94] 90 (10/27 0825) Resp:  [16-20] 20 (10/27 0825) BP: (102-138)/(61-97) 138/96 (10/27 0825) SpO2:  [95 %-98 %] 97 % (10/27 0825)  Intake/Output from previous day: 10/26 0701 - 10/27 0700 In: 980 [P.O.:840; I.V.:40; IV Piggyback:100] Out: 300 [Urine:300] Intake/Output this shift: No intake/output data recorded.  Physical Exam: Patient is awake, A/O X 4, conversant, and in good spirits. He is sitting up on the side of his bed with his LSO brace on. He is in NAD and VSS. Doing well. Speech is fluent and appropriate. MAEW with good strength that is symmetric bilaterally. 5/5 BUE/BLE. Sensation intact. PERLA, EOMI. CNs grossly intact. Wound vac in place and is CDI.    Lab Results: Recent Labs    09/28/21 0258  WBC 4.6  HGB 8.1*  HCT 25.7*  PLT 371   BMET Recent Labs    09/28/21 0258  NA 135  K 3.6  CL 103  CO2 27  GLUCOSE 125*  BUN 5*  CREATININE 0.70  CALCIUM 7.9*    Studies/Results: No results found.  Assessment/Plan: Patient is s/p Incision and debridement of lumbar wound and wound vac placement. He is continuing to do well overall. Neuro exam unchanged. Left hand pain and swelling continues to improve. His mood has continued to improve and is in better spirits this morning.     -Abx per ID, continue cefazolin followed by protracted Keflex -Continue Wound vac  -Please record wound vac output every shift -Continue working with PT/OT -Encourage mobility  -Continue working on pain control  LOS: 7 days     Council Mechanic, DNP, NP-C 09/30/2021, 8:35 AM

## 2021-09-30 NOTE — Plan of Care (Signed)
  Problem: Health Behavior/Discharge Planning: Goal: Ability to manage health-related needs will improve Outcome: Progressing   Problem: Clinical Measurements: Goal: Ability to maintain clinical measurements within normal limits will improve Outcome: Progressing Goal: Will remain free from infection Outcome: Progressing Goal: Diagnostic test results will improve Outcome: Progressing Goal: Respiratory complications will improve Outcome: Progressing Goal: Cardiovascular complication will be avoided Outcome: Progressing   Problem: Activity: Goal: Risk for activity intolerance will decrease Outcome: Progressing   Problem: Nutrition: Goal: Adequate nutrition will be maintained Outcome: Progressing   Problem: Coping: Goal: Level of anxiety will decrease Outcome: Progressing   Problem: Elimination: Goal: Will not experience complications related to bowel motility Outcome: Progressing Goal: Will not experience complications related to urinary retention Outcome: Progressing   Problem: Pain Managment: Goal: General experience of comfort will improve Outcome: Progressing   Problem: Safety: Goal: Ability to remain free from injury will improve Outcome: Progressing   Problem: Skin Integrity: Goal: Risk for impaired skin integrity will decrease Outcome: Progressing   Problem: Education: Goal: Ability to verbalize activity precautions or restrictions will improve Outcome: Progressing Goal: Knowledge of the prescribed therapeutic regimen will improve Outcome: Progressing Goal: Understanding of discharge needs will improve Outcome: Progressing   Problem: Activity: Goal: Ability to avoid complications of mobility impairment will improve Outcome: Progressing Goal: Ability to tolerate increased activity will improve Outcome: Progressing Goal: Will remain free from falls Outcome: Progressing   Problem: Bowel/Gastric: Goal: Gastrointestinal status for postoperative course  will improve Outcome: Progressing   Problem: Clinical Measurements: Goal: Ability to maintain clinical measurements within normal limits will improve Outcome: Progressing Goal: Postoperative complications will be avoided or minimized Outcome: Progressing Goal: Diagnostic test results will improve Outcome: Progressing   Problem: Pain Management: Goal: Pain level will decrease Outcome: Progressing   Problem: Skin Integrity: Goal: Will show signs of wound healing Outcome: Progressing   Problem: Health Behavior/Discharge Planning: Goal: Identification of resources available to assist in meeting health care needs will improve Outcome: Progressing   Problem: Bladder/Genitourinary: Goal: Urinary functional status for postoperative course will improve Outcome: Progressing   

## 2021-09-30 NOTE — Progress Notes (Signed)
Physical Therapy Treatment Patient Details Name: Michael Hill MRN: [id number] DOB: 1951-01-08 Today's Date: 09/30/2021   History of Present Illness 70 y/o male presented to ED on 10/19 for increasing back pain, R leg weakness, and worsening mobility. Initial L5-S1 decompression/fusion with extension to pelvis on 08/25/21. Patient with recent admission 10/5-10/11/22 for infection of recent surgical site s/p I&D on 10/5. MRI showed large fluid collection in soft tissues of the back which likely extends to posterior aspect of thecal sac at L5-S1. S/p lumbar wound I&D with wound VAC placement on 10/21. PMH: HTN, HLD, chronic back pain, GERD    PT Comments    Patient continues to progress towards physical therapy goals. Patient ambulating at supervision level with RW. Requires cues for upright posture due to tendency to keep trunk flexed. Able to recall 3/3 back precautions but requires cues during mobility to maintain. Continue to recommend HHPT at discharge to maximize functional independence and safety.     Recommendations for follow up therapy are one component of a multi-disciplinary discharge planning process, led by the attending physician.  Recommendations may be updated based on patient status, additional functional criteria and insurance authorization.  Follow Up Recommendations  Home health PT     Assistance Recommended at Discharge Frequent or constant Supervision/Assistance  Equipment Recommendations  Rolling Kennadie Brenner (2 wheels)    Recommendations for Other Services       Precautions / Restrictions Precautions Precautions: Fall;Back Precaution Booklet Issued: No Precaution Comments: Can recall 3/3 spinal precautions Required Braces or Orthoses: Spinal Brace Spinal Brace: Lumbar corset;Applied in sitting position Restrictions Weight Bearing Restrictions: No     Mobility  Bed Mobility Overal bed mobility: Needs Assistance Bed Mobility: Sidelying to Sit   Sidelying to  sit: Supervision       General bed mobility comments: supervision for safety    Transfers Overall transfer level: Modified independent Equipment used: Rolling Michael Hill (2 wheels)                    Ambulation/Gait Ambulation/Gait assistance: Supervision Gait Distance (Feet): 500 Feet Assistive device: Rolling Michael Hill (2 wheels) Gait Pattern/deviations: Step-through pattern;Decreased stride length;Trunk flexed Gait velocity: decr   General Gait Details: cues for upright posture as patient tends to keep trunk flexed.   Stairs Stairs: Yes Stairs assistance: Supervision Stair Management: One rail Left;Sideways Number of Stairs: 1 General stair comments: supervision for safety with line management   Wheelchair Mobility    Modified Rankin (Stroke Patients Only)       Balance Overall balance assessment: Needs assistance Sitting-balance support: No upper extremity supported;Feet supported Sitting balance-Michael Hill Scale: Good     Standing balance support: Bilateral upper extremity supported;During functional activity;Reliant on assistive device for balance Standing balance-Michael Hill Scale: Fair Standing balance comment: Able to maintain static standing balance without UE support during grooming tasks standing at sink level.                            Cognition Arousal/Alertness: Awake/alert Behavior During Therapy: WFL for tasks assessed/performed Overall Cognitive Status: Within Functional Limits for tasks assessed                                          Exercises      General Comments General comments (skin integrity, edema, etc.): Wife and son present at bedside.  Pertinent Vitals/Pain Pain Assessment: Faces Pain Score: 3  Faces Pain Scale: Hurts a little bit Pain Location: back Pain Descriptors / Indicators: Discomfort;Operative site guarding Pain Intervention(s): Monitored during session    Home Living                           Prior Function            PT Goals (current goals can now be found in the care plan section) Acute Rehab PT Goals Patient Stated Goal: to get stronger and build confidence PT Goal Formulation: With patient/family Time For Goal Achievement: 10/09/21 Potential to Achieve Goals: Good Progress towards PT goals: Progressing toward goals    Frequency    Min 5X/week      PT Plan Current plan remains appropriate    Co-evaluation              AM-PAC PT "6 Clicks" Mobility   Outcome Measure  Help needed turning from your back to your side while in a flat bed without using bedrails?: A Little Help needed moving from lying on your back to sitting on the side of a flat bed without using bedrails?: A Little Help needed moving to and from a bed to a chair (including a wheelchair)?: A Little Help needed standing up from a chair using your arms (e.g., wheelchair or bedside chair)?: A Little Help needed to walk in hospital room?: A Little Help needed climbing 3-5 steps with a railing? : A Little 6 Click Score: 18    End of Session Equipment Utilized During Treatment: Back brace Activity Tolerance: Patient tolerated treatment well Patient left: in chair;with call bell/phone within reach;with chair alarm set Nurse Communication: Mobility status PT Visit Diagnosis: Other abnormalities of gait and mobility (R26.89);Muscle weakness (generalized) (M62.81)     Time: 4010-2725 PT Time Calculation (min) (ACUTE ONLY): 28 min  Charges:  $Gait Training: 23-37 mins                     Michael Hill A. Dan Humphreys PT, DPT Acute Rehabilitation Services Pager [phone] Office [phone]    Michael Hill 09/30/2021, 4:42 PM

## 2021-09-30 NOTE — Progress Notes (Signed)
Occupational Therapy Treatment Patient Details Name: Michael Hill MRN: [id number] DOB: [birth year] Today's Date: 09/30/2021   History of present illness 70 y/o male presented to ED on 10/19 for increasing back pain, R leg weakness, and worsening mobility. Initial L5-S1 decompression/fusion with extension to pelvis on 08/25/21. Patient with recent admission 10/5-10/11/22 for infection of recent surgical site s/p I&D on 10/5. MRI showed large fluid collection in soft tissues of the back which likely extends to posterior aspect of thecal sac at L5-S1. S/p lumbar wound I&D with wound VAC placement on 10/21. PMH: HTN, HLD, chronic back pain, GERD   OT comments  OT treatment session with focus on self-care re-education with adherence to back precautions, AE to maximize safety/independence with LB ADLs and functional mobility in prep for safe d/c home (potential d/c tomorrow). Patient declined bathing/dressing task but completed 3/3 grooming tasks standing at sink level. Patient also able to doff footwear and don with compensatory technique and good adherence to spinal precautions. Unable to attain/maintain figure-4 position. Education provided on use of sock-aid. Patient/wife expressed verbal understanding. Patient currently functioning at Mod I to Min guard overall for ADLs, ADL transfers and short-distance functional mobility. Recommendation updated to no follow-up OT given good progress toward goals. Will continue to follow patient acutely.    Recommendations for follow up therapy are one component of a multi-disciplinary discharge planning process, led by the attending physician.  Recommendations may be updated based on patient status, additional functional criteria and insurance authorization.    Follow Up Recommendations  No OT follow up    Assistance Recommended at Discharge PRN  Equipment Recommendations  None recommended by OT (Patient has necessary DME.)    Recommendations for Other  Services      Precautions / Restrictions Precautions Precautions: Fall;Back Precaution Booklet Issued: No Precaution Comments: Can recall 3/3 spinal precautions Required Braces or Orthoses: Spinal Brace Spinal Brace: Lumbar corset;Applied in sitting position Restrictions Weight Bearing Restrictions: No       Mobility Bed Mobility               General bed mobility comments: Patient seated in recliner upon entry.    Transfers Overall transfer level: Modified independent Equipment used: Rolling walker (2 wheels)                     Balance Overall balance assessment: Needs assistance Sitting-balance support: No upper extremity supported;Feet supported Sitting balance-Leahy Scale: Good     Standing balance support: Bilateral upper extremity supported;During functional activity Standing balance-Leahy Scale: Fair Standing balance comment: Able to maintain static standing balance without UE support during grooming tasks standing at sink level.                           ADL either performed or assessed with clinical judgement   ADL Overall ADL's : Needs assistance/impaired     Grooming: Wash/dry hands;Wash/dry face;Oral care;Supervision/safety;Standing Grooming Details (indicate cue type and reason): Supervision A for safety. Good adherence to spinal precautions. Upper Body Bathing: Set up;Sitting   Lower Body Bathing: Min guard;Sit to/from stand   Upper Body Dressing : Set up;Sitting   Lower Body Dressing: Min guard;Sit to/from stand   Toilet Transfer: Supervision/safety;Rolling walker (2 wheels);BSC Toilet Transfer Details (indicate cue type and reason): Safety/line management.                 Vision       Perception  Praxis      Cognition Arousal/Alertness: Awake/alert Behavior During Therapy: WFL for tasks assessed/performed Overall Cognitive Status: Within Functional Limits for tasks assessed                                             Exercises     Shoulder Instructions       General Comments Wife and son present at bedside.    Pertinent Vitals/ Pain       Pain Assessment: 0-10 Pain Score: 3  Pain Location: back Pain Descriptors / Indicators: Discomfort;Operative site guarding Pain Intervention(s): Monitored during session  Home Living                                          Prior Functioning/Environment              Frequency  Min 2X/week        Progress Toward Goals  OT Goals(current goals can now be found in the care plan section)  Progress towards OT goals: Progressing toward goals  Acute Rehab OT Goals OT Goal Formulation: With patient/family Time For Goal Achievement: 10/10/21 Potential to Achieve Goals: Good ADL Goals Pt Will Perform Grooming: with supervision;standing Pt Will Perform Lower Body Bathing: with supervision;sitting/lateral leans Pt Will Perform Lower Body Dressing: with supervision Pt Will Transfer to Toilet: with min guard assist;bedside commode;squat pivot transfer  Plan Discharge plan needs to be updated;Frequency remains appropriate    Co-evaluation                 AM-PAC OT "6 Clicks" Daily Activity     Outcome Measure   Help from another person eating meals?: None Help from another person taking care of personal grooming?: A Little Help from another person toileting, which includes using toliet, bedpan, or urinal?: A Little Help from another person bathing (including washing, rinsing, drying)?: A Little Help from another person to put on and taking off regular upper body clothing?: A Little Help from another person to put on and taking off regular lower body clothing?: A Little 6 Click Score: 19    End of Session Equipment Utilized During Treatment: Gait belt;Rolling walker (2 wheels)  OT Visit Diagnosis: Unsteadiness on feet (R26.81);Muscle weakness (generalized) (M62.81);Pain Pain - part of  body:  (back)   Activity Tolerance Patient tolerated treatment well   Patient Left in chair;with call bell/phone within reach;with family/visitor present   Nurse Communication Mobility status        Time: 1610-9604 OT Time Calculation (min): 23 min  Charges: OT General Charges $OT Visit: 1 Visit OT Treatments $Self Care/Home Management : 23-37 mins  Renatta Shrieves H. OTR/L Supplemental OT, Department of rehab services [phone]  Benjie Ricketson R Howerton-Davis 09/30/2021, 1:40 PM

## 2021-09-30 NOTE — Progress Notes (Signed)
PROGRESS NOTE    Michael Hill  HCW:[id number] DOB: 04-Apr-1951 DOA: 09/22/2021 PCP: Tally Joe, MD    Brief Narrative:  Hypertension, hyperlipidemia, GERD, spinal stenosis and lumbar radiculopathy status post L5-S1 decompression fusion on 9/21, admitted 10/5-10/12 for infection of recent surgical site status post I&D on 10/5, acute hypoxemic respiratory failure due to COVID infection, complete hospital course due to severe sepsis, MSSA bacteremia and discharged home on IV Ancef and oral rifampin brought back to ER with worsening back pain, right leg weakness and drainage from the wound.  10/21, lumbar wound I&D with wound VAC placement and continued on Ancef.  Followed by infectious disease and neurosurgery. Patient had developed left hand swelling that is getting better now.  Assessment & Plan:   Principal Problem:   Postoperative wound infection Active Problems:   HYPERCHOLESTEROLEMIA   Hyponatremia   Anemia   Painful hand   MSSA bacteremia   Abscess   Hardware complicating wound infection (HCC)   Anorexia  Postoperative wound infection/recent MSSA bacteremia with severe sepsis: Repeat blood cultures negative. Repeat lumbar wound I&D and wound VAC placement 10/21, cultures negative so far. Followed by ID and neurosurgery, anticipate prolonged antibiotics.   Pain management remains issue.  Currently on oxycodone 10 mg every 12 hours as well as IV Dilaudid and oral oxycodone and Tylenol. Neurosurgery planning to close the wound before discharge.  Sinus tachycardia with history of SVT: Widely fluctuating heart rate.  Mostly SVT/PAT's. Heart rate controlled today on increasing dose of metoprolol.  Sinus rhythm.  Hyperlipidemia: On statin.  Left hand pain: Diffuse swelling after surgery.  X-ray consistent with osteoarthritic changes.  No evidence of infection. Uric acid level was normal. Improved and normalized.  Hypokalemia: Replaced and adequate.  Continue mobility.   Home after closure of the wound.  Checking inflammatory markers.   DVT prophylaxis: heparin injection 5,000 Units Start: 09/25/21 0600 SCDs Start: 09/23/21 0532   Code Status: Full code Family Communication: None.   Disposition Plan: Status is: Inpatient  Remains inpatient appropriate because: Active spinal wound on IV antibiotics. Possible wound closure before discharge.         Consultants:  Infectious disease Neurosurgery  Procedures:  I&D spinal wound 10/21  Antimicrobials:  Nafcillin 10/5--10/24 Ancef 10/24---   Subjective:  Patient seen and examined.  Frustrated being in the hospital.  Asking neurosurgery to close the wound.  No other overnight events.  Left hand swelling has completely improved.   Objective: Vitals:   09/30/21 0014 09/30/21 0428 09/30/21 0825 09/30/21 1201  BP: 102/86 132/85 (!) 138/96 118/78  Pulse: 74 90 90 69  Resp: 16 19 20 16   Temp: 98.5 F (36.9 C) 97.8 F (36.6 C) 97.6 F (36.4 C) 98.5 F (36.9 C)  TempSrc: Oral Oral Oral Oral  SpO2: 95% 95% 97%   Weight:      Height:        Intake/Output Summary (Last 24 hours) at 09/30/2021 1229 Last data filed at 09/30/2021 0329 Gross per 24 hour  Intake 680 ml  Output 300 ml  Net 380 ml   Filed Weights   09/22/21 1934 09/24/21 1323  Weight: 90.7 kg 90.7 kg    Examination:  General exam: Appears calm and comfortable .  On room air.  Sitting in chair. Respiratory system: Clear to auscultation. Respiratory effort normal.  No added sounds. Cardiovascular system: S1 & S2 heard, RRR. No JVD, murmurs, rubs, gallops or clicks. No pedal edema.  Heart rate is well controlled  at this time. Gastrointestinal system: Abdomen is nondistended, soft and nontender. No organomegaly or masses felt. Normal bowel sounds heard. Central nervous system: Alert and oriented. No focal neurological deficits. Extremities: Left hand swelling completely resolved. Lumbar wound with wound VAC in place,  serous fluid on the canister.     Data Reviewed: I have personally reviewed following labs and imaging studies  CBC: Recent Labs  Lab 09/25/21 1005 09/26/21 0217 09/27/21 0412 09/28/21 0258  WBC 6.9 6.1 5.3 4.6  HGB 9.4* 8.5* 8.4* 8.1*  HCT 29.6* 26.6* 27.3* 25.7*  MCV 88.9 87.5 91.0 89.5  PLT 331 341 383 371   Basic Metabolic Panel: Recent Labs  Lab 09/25/21 1005 09/26/21 0217 09/27/21 0412 09/28/21 0258  NA 135 133* 134* 135  K 3.5 3.6 3.3* 3.6  CL 100 101 101 103  CO2 26 25 25 27   GLUCOSE 192* 139* 126* 125*  BUN 6* 5* <5* 5*  CREATININE 0.76 0.66 0.61 0.70  CALCIUM 7.8* 7.5* 7.7* 7.9*  MG  --   --   --  1.8  PHOS  --   --   --  2.8   GFR: Estimated Creatinine Clearance: 93.6 mL/min (by C-G formula based on SCr of 0.7 mg/dL). Liver Function Tests: No results for input(s): AST, ALT, ALKPHOS, BILITOT, PROT, ALBUMIN in the last 168 hours.  No results for input(s): LIPASE, AMYLASE in the last 168 hours. No results for input(s): AMMONIA in the last 168 hours. Coagulation Profile: No results for input(s): INR, PROTIME in the last 168 hours. Cardiac Enzymes: No results for input(s): CKTOTAL, CKMB, CKMBINDEX, TROPONINI in the last 168 hours. BNP (last 3 results) No results for input(s): PROBNP in the last 8760 hours. HbA1C: No results for input(s): HGBA1C in the last 72 hours. CBG: No results for input(s): GLUCAP in the last 168 hours. Lipid Profile: No results for input(s): CHOL, HDL, LDLCALC, TRIG, CHOLHDL, LDLDIRECT in the last 72 hours. Thyroid Function Tests: No results for input(s): TSH, T4TOTAL, FREET4, T3FREE, THYROIDAB in the last 72 hours.  Anemia Panel: No results for input(s): VITAMINB12, FOLATE, FERRITIN, TIBC, IRON, RETICCTPCT in the last 72 hours. Sepsis Labs: No results for input(s): PROCALCITON, LATICACIDVEN in the last 168 hours.  Recent Results (from the past 240 hour(s))  Blood culture (routine x 2)     Status: None   Collection Time:  09/23/21 12:53 AM   Specimen: BLOOD  Result Value Ref Range Status   Specimen Description BLOOD LEFT ANTECUBITAL  Final   Special Requests   Final    BOTTLES DRAWN AEROBIC AND ANAEROBIC Blood Culture adequate volume   Culture   Final    NO GROWTH 5 DAYS Performed at [facility], [address] [place], [place]    Report Status 09/28/2021 FINAL  Final  Blood culture (routine x 2)     Status: None   Collection Time: 09/23/21  1:06 AM   Specimen: BLOOD RIGHT HAND  Result Value Ref Range Status   Specimen Description BLOOD RIGHT HAND  Final   Special Requests   Final    BOTTLES DRAWN AEROBIC AND ANAEROBIC Blood Culture adequate volume   Culture   Final    NO GROWTH 5 DAYS Performed at [facility], [address], [place], [place]    Report Status 09/28/2021 FINAL  Final  Aerobic/Anaerobic Culture w Gram Stain (surgical/deep wound)     Status: None   Collection Time: 09/23/21  3:41  AM   Specimen: Wound  Result Value Ref Range Status   Specimen Description WOUND  Final   Special Requests NONE  Final   Gram Stain NO WBC SEEN NO ORGANISMS SEEN   Final   Culture   Final    No growth aerobically or anaerobically. Performed at [facility], [address], [place], [place] [address]    Report Status 09/28/2021 FINAL  Final  Surgical pcr screen     Status: None   Collection Time: 09/23/21  8:38 PM   Specimen: Nasal Mucosa; Nasal Swab  Result Value Ref Range Status   MRSA, PCR NEGATIVE NEGATIVE Final   Staphylococcus aureus NEGATIVE NEGATIVE Final    Comment: (NOTE) The Xpert SA Assay (FDA approved for NASAL specimens in patients 64 years of age and older), is one component of a comprehensive surveillance program. It is not intended to diagnose infection nor to guide or monitor treatment. Performed at [facility], [address], [place], [place] [address]   Aerobic/Anaerobic Culture w Gram Stain (surgical/deep wound)     Status:  None   Collection Time: 09/24/21  2:50 PM   Specimen: Wound  Result Value Ref Range Status   Specimen Description WOUND  Final   Special Requests LUMBAR  Final   Gram Stain   Final    MODERATE WBC PRESENT, PREDOMINANTLY PMN NO ORGANISMS SEEN    Culture   Final    No growth aerobically or anaerobically. Performed at [facility], [address] [place], [place] [address]    Report Status 09/29/2021 FINAL  Final         Radiology Studies: No results found.      Scheduled Meds:  atorvastatin  20 mg Oral Daily   Chlorhexidine Gluconate Cloth  6 each Topical Daily   docusate sodium  100 mg Oral BID   famotidine  20 mg Oral Daily   heparin injection (subcutaneous)  5,000 Units Subcutaneous Q8H   metoprolol tartrate  50 mg Oral BID   oxyCODONE  20 mg Oral Q12H   Continuous Infusions:  sodium chloride 10 mL/hr at 09/29/21 1402    ceFAZolin (ANCEF) IV 2 g (09/30/21 0515)     LOS: 7 days    Time spent: 25 minutes    Dorcas Carrow, MD [facility] Pager [phone]

## 2021-10-01 DIAGNOSIS — A419 Sepsis, unspecified organism: Secondary | ICD-10-CM | POA: Diagnosis not present

## 2021-10-01 DIAGNOSIS — T8149XA Infection following a procedure, other surgical site, initial encounter: Secondary | ICD-10-CM | POA: Diagnosis not present

## 2021-10-01 DIAGNOSIS — M4620 Osteomyelitis of vertebra, site unspecified: Secondary | ICD-10-CM | POA: Diagnosis not present

## 2021-10-01 MED ORDER — METOPROLOL TARTRATE 50 MG PO TABS: 50.0000 mg | ORAL_TABLET | Freq: Two times a day (BID) | ORAL | 2 refills | Status: DC

## 2021-10-01 MED ORDER — OXYCODONE HCL ER 20 MG PO T12A: 20.0000 mg | EXTENDED_RELEASE_TABLET | Freq: Two times a day (BID) | ORAL | 0 refills | Status: DC

## 2021-10-01 MED ORDER — OXYCODONE HCL 5 MG PO TABS: 5.0000 mg | ORAL_TABLET | ORAL | 0 refills | Status: AC | PRN

## 2021-10-01 MED ORDER — CEFAZOLIN IV (FOR PTA / DISCHARGE USE ONLY): 2.0000 g | Freq: Three times a day (TID) | INTRAVENOUS | 0 refills | Status: AC

## 2021-10-01 MED ORDER — METHOCARBAMOL 500 MG PO TABS: 500.0000 mg | ORAL_TABLET | Freq: Three times a day (TID) | ORAL | 0 refills | Status: DC | PRN

## 2021-10-01 NOTE — Plan of Care (Signed)
Patient is going home with PICC line for antibiotics and wound vac for infection.

## 2021-10-01 NOTE — TOC Transition Note (Signed)
Transition of Care [facility]) - CM/SW Discharge Note   Patient Details  Name: Michael Hill MRN: [id number] Date of Birth: [birth year]  Transition of Care [facility]) CM/SW Contact:  Glennon Mac, RN Phone Number: 10/01/2021, 2:19 PM   Clinical Narrative:    Patient medically stable for discharge home today with spouse and home health services as prior to admission.  Notified [facility] of resumption of care orders and need for wound VAC dressing changes Monday Wednesday Friday.  [facility] will follow for IV infusion needs at home; OPAT orders signed by physician.  Wound VAC has been delivered to patient's room.  Patient denies any other DME for home.    Final next level of care: Home w Home Health Services Barriers to Discharge: Barriers Resolved   Patient Goals and CMS Choice   CMS Medicare.gov Compare Post Acute Care list provided to:: Patient Choice offered to / list presented to : Patient                        Discharge Plan and Services   Discharge Planning Services: CM Consult Post Acute Care Choice: Home Health, Resumption of Svcs/PTA Provider          DME Arranged: Vac DME Agency: [employer] Date DME Agency Contacted: 09/30/21 Time DME Agency Contacted: [id number] Representative spoke with at DME Agency: Barnie Mort HH Arranged: RN, PT [facility] Agency: [facility] Date [employer] Agency Contacted: 10/01/21 Time HH Agency Contacted: 1100    Social Determinants of Health (SDOH) Interventions     Readmission Risk Interventions Readmission Risk Prevention Plan 09/14/2021 09/14/2021 09/13/2021  Transportation Screening - - -  PCP or Specialist Appt within 3-5 Days (No Data) Complete -  HRI or Home Care Consult - - -  Social Work Consult for Recovery Care Planning/Counseling - - Complete  Palliative Care Screening - - Not Applicable  Medication Review (RN Care Manager) - - Complete  Some recent data might be hidden   Quintella Baton, RN, BSN   Trauma/Neuro ICU Case Manager [phone]

## 2021-10-01 NOTE — Consult Note (Addendum)
WOC Nurse Consult Note: Vac dressing change performed to full thickness post-op wound to middle back.  Denied need for pain meds and tolerated with minimal amt discomfort.  Refer to previous progress notes for measurements.  Wound bed: Beefy red with some white tissue to inner wound bed Drainage (amount, consistency, odor) mod amt pink drainage in the cannister Periwound: intact Dressing procedure/placement/frequency: Applied barrier ring to attempt to maintain a seal around wound edges, then one piece black foam to cont suction.  Bridged track pad to flank to reduce pressure. Pt replaced his own back brace and plans for discharge today after home negative pressure wound therapy machine arrives.   Thank-you,  Cammie Mcgee MSN, RN, CWOCN, [place], CNS [phone]

## 2021-10-01 NOTE — Progress Notes (Signed)
   Providing Compassionate, Quality Care - Together  NEUROSURGERY PROGRESS NOTE   S: No issues overnight.   O: EXAM:  BP (!) 123/59 (BP Location: Left Arm)   Pulse 62   Temp 98.6 F (37 C) (Oral)   Resp 13   Ht 5\' 7"  (1.702 m)   Wt 90.7 kg   SpO2 98%   BMI 31.32 kg/m   Awake, alert, oriented  Speech fluent, appropriate  CNs grossly intact  MAE equally [place] in place  ASSESSMENT:  70 y.o. male with   Nonunion L5-S1 Wound infection  PLAN: - rec leaving wound vac due to recurrent need for wound washout. Ok for 78 home with HHC and wound therapies -cont abx per ID - will have patient f/u in a few weeks    Thank you for allowing me to participate in this patient's care.  Please do not hesitate to call with questions or concerns.   [employer], DO Neurosurgeon [facility] Cell: [phone]

## 2021-10-01 NOTE — Discharge Summary (Signed)
Physician Discharge Summary  Michael Hill MOQ:[id number] DOB: 1951-01-30 DOA: 09/22/2021  PCP: Antony Contras, MD  Admit date: 09/22/2021 Discharge date: 10/01/2021  Admitted From: Home Disposition: Home with home health  Recommendations for Outpatient Follow-up:  Follow up with PCP in 1-2 weeks Neurosurgery will schedule follow-up. Home infusion therapy, wound VAC.  Blood testing, drug monitoring as instructed below.  Home Health: PT/RN/wound care Equipment/Devices: Wound VAC  Discharge Condition: Stable CODE STATUS: Full code Diet recommendation: Low-salt diet   Discharge Summary: Hypertension, hyperlipidemia, GERD, spinal stenosis and lumbar radiculopathy status post L5-S1 decompression fusion on 9/21, admitted 10/5-10/12 for infection of recent surgical site status post I&D on 10/5, acute hypoxemic respiratory failure due to COVID infection, complicated hospital course due to severe sepsis, MSSA bacteremia and discharged home on IV Ancef and oral rifampin brought back to ER with worsening back pain, right leg weakness and drainage from the wound.   10/21, lumbar wound I&D with wound VAC placement and continued on Ancef.  Followed by infectious disease and neurosurgery.  # Postoperative wound infection/recent MSSA bacteremia with severe sepsis: Repeat blood cultures negative. Repeat lumbar wound I&D and wound VAC placement 10/21, cultures negative so far. Followed by ID and neurosurgery, anticipate prolonged antibiotics.   Pain management remains issue.  Currently on oxycodone 10 mg every 12 hours as well as oral oxycodone and Tylenol. Neurosurgery recommended discharge home with wound VAC in place. Patient going home today with IV antibiotics through right sided PICC line through 12/2.  Will need drug monitoring, liver function test and renal function test monitoring that was ordered. Going home with wound VAC in place.  # Sinus tachycardia with history of SVT: Widely  fluctuating heart rate.  Mostly SVT/PAT's. Heart rate controlled on increasing dose of metoprolol.  Sinus rhythm. Will discharge patient with metoprolol 50 mg 2 times a day.   # Left hand pain: Diffuse swelling after surgery.  X-ray consistent with osteoarthritic changes.  No evidence of infection. Uric acid level was normal. Improved and normalized.  Patient with adequate improvement.  Able to go home today with home infusion therapy, wound VAC at home.   Discharge Diagnoses:  Principal Problem:   Postoperative wound infection Active Problems:   HYPERCHOLESTEROLEMIA   Hyponatremia   Anemia   Painful hand   MSSA bacteremia   Abscess   Hardware complicating wound infection ([place])   Anorexia    Discharge Instructions  Discharge Instructions     [facility] pharmacist to adjust dose for Vancomycin, Aminoglycosides and other anti-infective therapies as requested by physician.   Complete by: As directed    [facility] to provide Cath Flo 2mg    Complete by: As directed    Administer for PICC line occlusion and as ordered by physician for other access device issues.   Anaphylaxis Kit: Provided to treat any anaphylactic reaction to the medication being provided to the patient if First Dose or when requested by physician   Complete by: As directed    Epinephrine 1mg /ml vial / amp: Administer 0.3mg  (0.43ml) subcutaneously once for moderate to severe anaphylaxis, nurse to call physician and pharmacy when reaction occurs and call 911 if needed for immediate care   Diphenhydramine 50mg /ml IV vial: Administer 25-50mg  IV/IM PRN for first dose reaction, rash, itching, mild reaction, nurse to call physician and pharmacy when reaction occurs   Sodium Chloride 0.9% NS 572ml IV: Administer if needed for hypovolemic blood pressure drop or as ordered by physician after call to  physician with anaphylactic reaction   Call MD for:  redness, tenderness, or signs of infection (pain,  swelling, redness, odor or green/yellow discharge around incision site)   Complete by: As directed    Call MD for:  severe uncontrolled pain   Complete by: As directed    Call MD for:  temperature >100.4   Complete by: As directed    Change dressing on IV access line weekly and PRN   Complete by: As directed    Diet - low sodium heart healthy   Complete by: As directed    Discharge wound care:   Complete by: As directed    Wound VAC.   Flush IV access with Sodium Chloride 0.9% and Heparin 10 units/ml or 100 units/ml   Complete by: As directed    Home infusion instructions - [facility]   Complete by: As directed    Instructions: Flush IV access with Sodium Chloride 0.9% and Heparin 10units/ml or 100units/ml   Change dressing on IV access line: Weekly and PRN   Instructions Cath Flo $Remove'2mg'[id number]$ : Administer for PICC Line occlusion and as ordered by physician for other access device   [facility] pharmacist to adjust dose for: Vancomycin, Aminoglycosides and other anti-infective therapies as requested by physician   Increase activity slowly   Complete by: As directed    Method of administration may be changed at the discretion of home infusion pharmacist based upon assessment of the patient and/or caregiver's ability to self-administer the medication ordered   Complete by: As directed       Allergies as of 10/01/2021   No Known Allergies      Medication List     STOP taking these medications    metoprolol succinate 50 MG 24 hr tablet Commonly known as: Toprol XL   pantoprazole 40 MG tablet Commonly known as: PROTONIX   rifampin 300 MG capsule Commonly known as: Rifadin       TAKE these medications    acetaminophen 325 MG tablet Commonly known as: TYLENOL Take 325 mg by mouth every 6 (six) hours as needed for moderate pain, headache or mild pain.   atorvastatin 20 MG tablet Commonly known as: LIPITOR Take 20 mg by mouth daily.   ceFAZolin   IVPB Commonly known as: ANCEF Inject 2 g into the vein every 8 (eight) hours. Indication:  Lumbar Infection- MSSA First Dose: Yes Last Day of Therapy:  11/05/21 Labs - Once weekly:  CBC/D and BMP, Labs - Every other week:  ESR and CRP Method of administration: IV Push Method of administration may be changed at the discretion of home infusion pharmacist based upon assessment of the patient and/or caregiver's ability to self-administer the medication ordered. What changed: additional instructions   docusate sodium 100 MG capsule Commonly known as: COLACE Take 1 capsule (100 mg total) by mouth 2 (two) times daily.   famotidine 20 MG tablet Commonly known as: PEPCID Take 1 tablet (20 mg total) by mouth daily. What changed:  when to take this reasons to take this   methocarbamol 500 MG tablet Commonly known as: ROBAXIN Take 1 tablet (500 mg total) by mouth every 8 (eight) hours as needed for muscle spasms. What changed: when to take this   metoprolol tartrate 50 MG tablet Commonly known as: LOPRESSOR Take 1 tablet (50 mg total) by mouth 2 (two) times daily.   oxyCODONE 20 mg 12 hr tablet Commonly known as: OXYCONTIN Take 1 tablet (20 mg total) by  mouth every 12 (twelve) hours. What changed:  medication strength how much to take when to take this additional instructions   oxyCODONE 5 MG immediate release tablet Commonly known as: Oxy IR/ROXICODONE Take 1-2 tablets (5-10 mg total) by mouth every 3 (three) hours as needed for moderate pain. What changed:  how much to take when to take this reasons to take this               Discharge Care Instructions  (From admission, onward)           Start     Ordered   10/01/21 0000  Change dressing on IV access line weekly and PRN  (Home infusion instructions - [facility] )        10/01/21 1108   10/01/21 0000  Discharge wound care:       Comments: Wound VAC.   10/01/21 1151            Follow-up  Information     Tommy Medal, Lavell Islam, MD Follow up.   Specialty: Infectious Diseases Why: 10/22/21 at 4:15 pm. Please call to reschedule if you are not able to  make this appointment. Contact information: [address] [address] [phone]                No Known Allergies  Consultations: Infectious disease Neurosurgery   Procedures/Studies: CT Angio Chest Pulmonary Embolism (PE) W or WO Contrast  Result Date: 09/08/2021 CLINICAL DATA:  Tachycardia, tachypnea, COVID EXAM: CT ANGIOGRAPHY CHEST WITH CONTRAST TECHNIQUE: Multidetector CT imaging of the chest was performed using the standard protocol during bolus administration of intravenous contrast. Multiplanar CT image reconstructions and MIPs were obtained to evaluate the vascular anatomy. CONTRAST:  71mL OMNIPAQUE IOHEXOL 350 MG/ML SOLN COMPARISON:  Same day chest radiograph FINDINGS: Cardiovascular: Evaluation of the pulmonary arteries is degraded by contrast bolus timing and respiratory motion artifact in the distal pulmonary arteries. There is no definite pulmonary embolism to the segmental level. The main pulmonary artery is enlarged measuring up to 3.6 cm suggesting pulmonary hypertension. The heart is enlarged. There is no pericardial effusion. Coronary artery calcifications are noted. Mediastinum/Nodes: There is a subcentimeter left thyroid nodule. The esophagus is grossly unremarkable. There is a small hiatal hernia. There are scattered prominent mediastinal lymph nodes measuring up to 8 mm in the precarinal region. Prominent but not pathologically enlarged hilar lymph nodes are also noted. There is no axillary lymphadenopathy. Lungs/Pleura: There is debris in the upper trachea. The remainder of the trachea and central airways are patent, there is marked central bronchial wall thickening. There are scattered patchy ground-glass opacities predominantly at the periphery of the lungs, with the largest focal  area ground-glass opacity measuring up to 2.0 cm in the right lower lobe. There is significant interlobular septal thickening. There is no pleural effusion or pneumothorax. Upper Abdomen: The imaged portions of the upper abdominal viscera are unremarkable. Musculoskeletal: There is multilevel degenerative change of the thoracic spine with vacuum disc phenomenon at multiple levels. There is no acute osseous abnormality or aggressive osseous lesion. Review of the MIP images confirms the above findings. IMPRESSION: 1. No definite evidence of pulmonary embolism to the segmental level, within the confines of poor contrast bolus timing and respiratory motion artifact. 2. Scattered patchy ground-glass opacities in the lungs favored to be infectious or inflammatory in etiology with differential including COVID-19. Recommend follow-up CT chest after resolution of symptoms to ensure resolution. 3. Cardiomegaly with moderate pulmonary  interstitial edema. Prominent mediastinal and hilar lymph nodes may be related to heart failure. 4. Enlarged main pulmonary artery suggesting pulmonary hypertension. 5. Debris in the trachea. Correlate with any signs or symptoms of aspiration. 6. Extensive coronary artery calcifications. Electronically Signed   By: Valetta Mole M.D.   On: 09/08/2021 15:46   CT LUMBAR SPINE W CONTRAST  Result Date: 09/22/2021 CLINICAL DATA:  Low back pain, infection suspected, history of low back pain and abscess after laminectomy. EXAM: CT LUMBAR SPINE WITH CONTRAST TECHNIQUE: Multidetector CT imaging of the lumbar spine was performed with intravenous contrast administration. CONTRAST:  88mL OMNIPAQUE IOHEXOL 350 MG/ML SOLN COMPARISON:  09/08/2021 FINDINGS: Segmentation: 5 lumbar type vertebrae. Alignment: Levocurvature of the lumbar spine. Unchanged mild retrolisthesis L1 on L2. Vertebrae: No acute fracture. Status post L2-bi iliac fusion with ALIF at L5-S1, with no evidence of paraspinous lucency or  hardware fracture. Paraspinal and other soft tissues: Peripherally enhancing fluid collection with foci of air in the subcutaneous soft tissues posterior to the lumbar spine, extending from the level of L2 to S3, measuring up to 7.9 x 5.7 x 15.6 cm (AP x TR x CC) (series 5, image 99 and series 9, image 54), previously 3.7 x 4.7 x 17.6 cm. The collection now extends into the paraspinous musculature and up to the spinal hardware from L4 through S2, most prominently at L5, where air and fluid are seen between the spinous process and left spinal rod. Additional subcutaneous edema extends from the thoracolumbar junction to the inferior sacrum. Focal skin defect posterior to L2 (series 5, image 52). Disc levels: Redemonstrated disc height loss and vacuum disc phenomenon at T11-T12, T12-L1, and L1-L2, without significant spinal canal stenosis. IMPRESSION: Interval increase in the size and complexity of a fluid collection in the soft tissues posterior to the lumbosacral operative bed, with peripheral enhancement and internal foci of air, concerning for abscess with gas-forming organisms versus air entering from an open skin wound posterior to L2. The collection extends into the paraspinous musculature from L4 through S2, and, at the level of L5, it extends anteriorly to the space between the left spinal rod and spinous process. Electronically Signed   By: Merilyn Baba M.D.   On: 09/22/2021 23:56   CT LUMBAR SPINE W CONTRAST  Result Date: 09/08/2021 CLINICAL DATA:  70 year old male status post multiple spine surgeries, most recently last month with wound drainage. EXAM: CT LUMBAR SPINE WITH CONTRAST TECHNIQUE: Multidetector CT imaging of the lumbar spine was performed with intravenous contrast administration. CONTRAST:  30mL OMNIPAQUE IOHEXOL 350 MG/ML SOLN COMPARISON:  CT lumbar myelogram 05/10/2021. FINDINGS: Segmentation: Normal, the same numbering system used in June. Alignment: Chronic levoconvex lumbar scoliosis  is stable. Stable mild retrolisthesis of L1 on L2 and straightening of the visible lower thoracic kyphosis. Vertebrae: Postoperative details are below. Visible lower thoracic levels appear intact. No acute osseous abnormality identified. Paraspinal and other soft tissues: Patchy and indistinct edema or fluid in the subcutaneous soft tissues posterior to the lumbar spine beginning at the thoracolumbar junction and continuing to the sacrum (series 5, image 109). No rim enhancing or organized collection. No soft tissue gas. Aortoiliac calcified atherosclerosis. Negative visible costophrenic angles and abdominal viscera. Disc levels: T10-T11 and T11-T12: Vacuum disc and disc space loss. No spinal stenosis. T12-L1: Chronic severe disc space loss. Vacuum disc has increased since June. Circumferential disc osteophyte complex eccentric to the left is stable. No significant spinal stenosis. Moderate left T12 foraminal stenosis. L1-L2: Chronic severe disc  space loss with increased vacuum disc. Stable mild retrolisthesis. Circumferential disc osteophyte complex with mild to moderate posterior element hypertrophy appears stable. Stable borderline spinal stenosis suspected, moderate bilateral L1 foraminal stenosis. L2-L3: Chronic posterior and interbody fusion. Stable pedicle screws and interbody implant without loosening. Stable interbody arthrodesis (sagittal image 46). Residual posterior element spurring with no definite posterior element arthrodesis. No stenosis. L3-L4: Chronic posterior and interbody fusion with stable hardware. Stable interbody and right side posterior element arthrodesis. No significant stenosis. L4-L5: A 2nd set of spinal rods begins at this level, but chronic L4 and L5 pedicle screws and interbody implant here appears stable without loosening. However, no convincing arthrodesis. Moderate facet hypertrophy is stable. The L5 screws traverse the facet joints as before. Chronic endplate spurring. Stable  borderline to mild osseous foraminal stenosis. L5-S1: Chronic anterior and posterior fusion hardware at this level with mildly increased lucency along the bilateral S1 pedicle screws since June. Some anterior interbody screw loosening also redemonstrated. No arthrodesis. Moderate to severe residual endplate and facet spurring maximal at the left L5 neural foramen. This level has not significantly changed since June. New bilateral cortical screws traversing the SI joints and extending into the iliac bones appear intact without evidence of loosening. IMPRESSION: 1. Patchy subcutaneous edema and/or fluid throughout the posterior paraspinal soft tissues. No organized or rim enhancing fluid collection, and no soft tissue gas. 2. Chronic L4-L5 and L5-S1 pseudoarthrosis, chronic L5 and S1 hardware loosening. 3. Addition of new bilateral sacroiliac screws and new interlocking posterior spinal rods since June - with no adverse features associated with this hardware. 4. Stable L2 through L4 spinal hardware without loosening, and solid arthrodesis suspected at L2-L3 and L3-L4. 5. Advanced degeneration in the lower thoracic levels and adjacent L1-L2 segment. Increased vacuum disc since June but otherwise stable. 6.  Aortic Atherosclerosis (ICD10-I70.0). Electronically Signed   By: Genevie Ann M.D.   On: 09/08/2021 06:03   MR LUMBAR SPINE WO CONTRAST  Result Date: 09/23/2021 CLINICAL DATA:  Low back pain, infection suspected EXAM: MRI LUMBAR SPINE WITHOUT CONTRAST TECHNIQUE: Multiplanar, multisequence MR imaging of the lumbar spine was performed. No intravenous contrast was administered. COMPARISON:  05/08/2014 lumbar spine MRI, correlation is also made with same day CT L-spine FINDINGS: Extremely limited, as the patient was not able to tolerate the exam. Only scouts and a sagittal T2 sequence was obtained, which is degraded by motion artifact. The fluid collection noted on the prior CT is actually better evaluated on the scout  (series 7, images 5-12), where it measures up to 7.1 cm in AP dimension and 17 cm in craniocaudal dimension (series 7, image 10). On the axial scout images (series 8) the fluid collection can be seen extending through a laminectomy defect at the level of L5-S1, and approaching the posterior aspect of the thecal sac (series 8, image 3). The fluid collection can also be seen extending to the skin surface on this image. IMPRESSION: Limited exam due to the sequences obtained. Within this limitation, there is a large fluid collection in the soft tissues of the back, which likely extends to the posterior aspect of the thecal sac at the L5-S1 level. Electronically Signed   By: Merilyn Baba M.D.   On: 09/23/2021 03:04   MR Lumbar Spine W Wo Contrast  Result Date: 09/23/2021 CLINICAL DATA:  Right leg weakness. History of lumbar surgery with subsequent wound infection EXAM: MRI LUMBAR SPINE WITHOUT AND WITH CONTRAST TECHNIQUE: Multiplanar and multiecho pulse sequences of the  lumbar spine were obtained without and with intravenous contrast. CONTRAST:  67mL GADAVIST GADOBUTROL 1 MMOL/ML IV SOLN COMPARISON:  CT 09/22/2021 FINDINGS: Technical note: Significantly limited exam secondary to patient motion artifact. Evaluation of the spinal canal is significantly degraded by motion artifact as well as metallic susceptibility artifact related to patient's fusion hardware. Segmentation:  Standard. Alignment: Lumbar levocurvature. No significant static listhesis is evident. Vertebrae: Susceptibility artifact related to L2-bi-iliac fusion with interbody spacers at the L2-3 through L5-S1 levels. Conus medullaris and cauda equina: Conus not definitively visualized. Cauda quadrant nerve roots are not able to be adequately assessed by artifact. Paraspinal and other soft tissues: Peripherally enhancing fluid collection within the soft tissues posterior to the lower lumbar spine measures approximately 11.1 x 3.3 cm (series 13, image 6).  It is difficult to determine the anterior extent of the collection secondary to artifact. Disc levels: Evaluation of the disc levels is significantly degraded. There may be mild canal stenosis at T12-L1 and L1-2. Nondiagnostic assessment for abnormal fluid collections in or around the spinal canal. IMPRESSION: 1. Significantly limited exam secondary to patient motion artifact as well as metallic susceptibility artifact related to patient's fusion hardware. 2. Peripherally enhancing fluid collection within the soft tissues posterior to the lower lumbar spine measuring up to 11.1 x 3.3 cm. It is difficult to determine the anterior extent of the collection secondary to artifact. Collection appears decreased in size compared to the previous CT of 09/22/2021. 3. Suspected mild canal stenosis at T12-L1 and L1-2. 4. Evaluation of the remaining spinal levels is significantly degraded. Electronically Signed   By: Davina Poke D.O.   On: 09/23/2021 16:11   DG Hand 2 View Left  Result Date: 09/25/2021 CLINICAL DATA:  Pain of the left hand and forearm. EXAM: LEFT HAND - 2 VIEW COMPARISON:  None. FINDINGS: Mild joint space loss at the radiocarpal joint. Chondrocalcinosis of the triangular fibrocartilage. Advanced degenerative change at the first carpometacarpal articulation with joint space narrowing, osteophyte formation and possible intra-articular loose bodies. This would quite likely be painful. 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 Inter phalangeal joints appear unremarkable. IMPRESSION: Advanced degenerative change at the first carpometacarpal articulation, which would likely be painful. Lesser degenerative changes at the MCP joints of the thumb, index and long fingers and of the radiocarpal joint. Electronically Signed   By: Nelson Chimes M.D.   On: 09/25/2021 12:00   DG CHEST PORT 1 VIEW  Result Date: 09/10/2021 CLINICAL DATA:  Shortness of  breath EXAM: PORTABLE CHEST 1 VIEW COMPARISON:  Radiograph 09/08/2021 FINDINGS: Unchanged cardiomegaly. Increased, diffuse interstitial opacities with increased alveolar opacities in the right mid and lower lung. No large pleural effusion. No visible pneumothorax. No acute osseous abnormality. IMPRESSION: Increased pulmonary edema.  Unchanged cardiomegaly Electronically Signed   By: Maurine Simmering M.D.   On: 09/10/2021 09:38   Portable chest 1 View  Result Date: 09/08/2021 CLINICAL DATA:  Recent back surgery. No with back spasms and cloudy drainage. EXAM: PORTABLE CHEST 1 VIEW COMPARISON:  07/28/14 FINDINGS: Enlargement of the cardiac silhouette may reflect portable technique. No signs of pleural effusion. Asymmetric increase interstitial markings identified within the right upper lobe. No focal airspace consolidation. No signs of pleural effusion. Visualized osseous structures appear intact. IMPRESSION: Asymmetric increase interstitial markings within the right upper lobe may reflect interstitial edema or atypical infection. Electronically Signed   By: Kerby Moors M.D.   On:  09/08/2021 11:38   ECHOCARDIOGRAM COMPLETE  Result Date: 09/09/2021    ECHOCARDIOGRAM REPORT   Patient Name:   KAZI REPPOND Date of Exam: 09/09/2021 Medical Rec #:  [id number]       Height:       67.0 in Accession #:    [id number]      Weight:       215.2 lb Date of Birth:  [birth year]      BSA:          2.086 m Patient Age:    30 years        BP:           120/69 mmHg Patient Gender: M               HR:           101 bpm. Exam Location:  Inpatient Procedure: 2D Echo, Cardiac Doppler and Color Doppler Indications:    CHF Acute Diastolic I 63.01  History:        Patient has no prior history of Echocardiogram examinations.                 Arrythmias:Tachycardia. Sepsis. Deveoped infection post back                 surgery. Covid 19 positive. Edema.  Sonographer:    Merrie Roof RDCS Referring Phys: [id number] [place]  1.  Left ventricular ejection fraction, by estimation, is 55 to 60%. The left ventricle has normal function. The left ventricle has no regional wall motion abnormalities. Left ventricular diastolic function could not be evaluated.  2. Right ventricular systolic function was not well visualized. The right ventricular size is not well visualized. There is normal pulmonary artery systolic pressure.  3. Left atrial size was mildly dilated.  4. The mitral valve is normal in structure. Trivial mitral valve regurgitation.  5. The aortic valve is normal in structure. Aortic valve regurgitation is not visualized. FINDINGS  Left Ventricle: Left ventricular ejection fraction, by estimation, is 55 to 60%. The left ventricle has normal function. The left ventricle has no regional wall motion abnormalities. The left ventricular internal cavity size was normal in size. There is  no left ventricular hypertrophy. Left ventricular diastolic function could not be evaluated. Right Ventricle: The right ventricular size is not well visualized. Right vetricular wall thickness was not well visualized. Right ventricular systolic function was not well visualized. There is normal pulmonary artery systolic pressure. The tricuspid regurgitant velocity is 2.53 m/s, and with an assumed right atrial pressure of 3 mmHg, the estimated right ventricular systolic pressure is 35.5 mmHg. Left Atrium: Left atrial size was mildly dilated. Right Atrium: Right atrial size was not well visualized. Pericardium: There is no evidence of pericardial effusion. Mitral Valve: The mitral valve is normal in structure. Trivial mitral valve regurgitation. Tricuspid Valve: The tricuspid valve is normal in structure. Tricuspid valve regurgitation is not demonstrated. Aortic Valve: The aortic valve is normal in structure. Aortic valve regurgitation is not visualized. Pulmonic Valve: The pulmonic valve was normal in structure. Pulmonic valve regurgitation is trivial. Aorta:  The aortic root is normal in size and structure. IAS/Shunts: The atrial septum is grossly normal.  LEFT VENTRICLE PLAX 2D LVIDd:         5.30 cm LVIDs:         3.30 cm LV PW:         1.30 cm LV IVS:  1.10 cm  IVC IVC diam: 1.30 cm LEFT ATRIUM             Index LA diam:        4.00 cm 1.92 cm/m LA Vol (A2C):   61.3 ml 29.39 ml/m LA Vol (A4C):   84.3 ml 40.41 ml/m LA Biplane Vol: 73.6 ml 35.28 ml/m   AORTA Ao Root diam: 3.90 cm TRICUSPID VALVE TR Peak grad:   25.6 mmHg TR Vmax:        253.00 cm/s Mertie Moores MD Electronically signed by Mertie Moores MD Signature Date/Time: 09/09/2021/2:25:35 PM    Final    ECHO TEE  Result Date: 09/14/2021    TRANSESOPHOGEAL ECHO REPORT   Patient Name:   [name] Date of Exam: 09/14/2021 Medical Rec #:  [id number]       Height:       67.0 in Accession #:    [id number]      Weight:       215.2 lb Date of Birth:  09-05-1951      BSA:          2.086 m Patient Age:    14 years        BP:           125/80 mmHg Patient Gender: M               HR:           80 bpm. Exam Location:  Inpatient Procedure: Transesophageal Echo Indications:     Bacteremia  History:         Patient has prior history of Echocardiogram examinations, most                  recent 09/09/2021.  Sonographer:     Merrie Roof RDCS Referring Phys:  [place] Diagnosing Phys: Kirk Ruths MD PROCEDURE: The transesophogeal probe was passed without difficulty through the esophogus of the patient. Sedation performed by different physician. The patient developed no complications during the procedure. IMPRESSIONS  1. No vegetations.  2. Left ventricular ejection fraction, by estimation, is 55 to 60%. The left ventricle has normal function.  3. Right ventricular systolic function is normal. The right ventricular size is normal.  4. No left atrial/left atrial appendage thrombus was detected.  5. The mitral valve is normal in structure. Trivial mitral valve regurgitation.  6. The aortic valve is  tricuspid. Aortic valve regurgitation is not visualized.  7. Aortic dilatation noted. 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 FINDINGS  Left Ventricle: Left ventricular ejection fraction, by estimation, is 55 to 60%. The left ventricle has normal function. The left ventricular internal cavity size was normal in size. Right Ventricle: The right ventricular size is normal. Right ventricular systolic function is normal. Left Atrium: Left atrial size was normal in size. No left atrial/left atrial appendage thrombus was detected. Right Atrium: Right atrial size was normal in size. Pericardium: There is no evidence of pericardial effusion. Mitral Valve: The mitral valve is normal in structure. Trivial mitral valve regurgitation. Tricuspid Valve: The tricuspid valve is normal in structure. Tricuspid valve regurgitation is trivial. Aortic Valve: The aortic valve is tricuspid. Aortic valve regurgitation is not visualized. Pulmonic Valve: The pulmonic valve was normal in structure. Pulmonic valve regurgitation is trivial. Aorta: Aortic dilatation noted. 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 IAS/Shunts: No atrial level shunt detected by color flow Doppler. Additional Comments: No  vegetations. Kirk Ruths MD Electronically signed by Kirk Ruths MD Signature Date/Time: 09/14/2021/3:02:02 PM    Final    VAS [place] LOWER EXTREMITY VENOUS (DVT)  Result Date: 09/10/2021  Lower Venous DVT Study Patient Name:  KEIMON BASALDUA  Date of Exam:   09/10/2021 Medical Rec #: [id number]        Accession #:    [id number] Date of Birth: 12/15/1950       Patient Gender: M Patient Age:   35 years Exam Location:  [facility] Procedure:      VAS [place] LOWER EXTREMITY VENOUS (DVT) Referring Phys: PRANAV PATEL --------------------------------------------------------------------------------  Indications: D-dimer, Covid-19.  Comparison Study: No prior studies. Performing Technologist:  Darlin Coco RDMS, RVT  Examination Guidelines: A complete evaluation includes B-mode imaging, spectral Doppler, color Doppler, and power Doppler as needed of all accessible portions of each vessel. Bilateral testing is considered an integral part of a complete examination. Limited examinations for reoccurring indications may be performed as noted. The reflux portion of the exam is performed with the patient in reverse Trendelenburg.  +---------+---------------+---------+-----------+----------+--------------+ RIGHT    CompressibilityPhasicitySpontaneityPropertiesThrombus Aging +---------+---------------+---------+-----------+----------+--------------+ CFV      Full           Yes      Yes                                 +---------+---------------+---------+-----------+----------+--------------+ SFJ      Full                                                        +---------+---------------+---------+-----------+----------+--------------+ FV Prox  Full                                                        +---------+---------------+---------+-----------+----------+--------------+ FV Mid   Full                                                        +---------+---------------+---------+-----------+----------+--------------+ FV DistalFull                                                        +---------+---------------+---------+-----------+----------+--------------+ PFV      Full                                                        +---------+---------------+---------+-----------+----------+--------------+ POP      Full           Yes      Yes                                 +---------+---------------+---------+-----------+----------+--------------+  PTV      Full                                                        +---------+---------------+---------+-----------+----------+--------------+ PERO     Full                                                         +---------+---------------+---------+-----------+----------+--------------+ Gastroc  Full                                                        +---------+---------------+---------+-----------+----------+--------------+   +---------+---------------+---------+-----------+----------+--------------+ LEFT     CompressibilityPhasicitySpontaneityPropertiesThrombus Aging +---------+---------------+---------+-----------+----------+--------------+ CFV      Full           Yes      Yes                                 +---------+---------------+---------+-----------+----------+--------------+ SFJ      Full                                                        +---------+---------------+---------+-----------+----------+--------------+ FV Prox  Full                                                        +---------+---------------+---------+-----------+----------+--------------+ FV Mid   Full                                                        +---------+---------------+---------+-----------+----------+--------------+ FV DistalFull                                                        +---------+---------------+---------+-----------+----------+--------------+ PFV      Full                                                        +---------+---------------+---------+-----------+----------+--------------+ POP      Full           Yes      Yes                                 +---------+---------------+---------+-----------+----------+--------------+  PTV      Full                                                        +---------+---------------+---------+-----------+----------+--------------+ PERO     Full                                                        +---------+---------------+---------+-----------+----------+--------------+ Gastroc  Full                                                         +---------+---------------+---------+-----------+----------+--------------+     Summary: RIGHT: - There is no evidence of deep vein thrombosis in the lower extremity.  - No cystic structure found in the popliteal fossa.  LEFT: - There is no evidence of deep vein thrombosis in the lower extremity.  - No cystic structure found in the popliteal fossa.  *See table(s) above for measurements and observations. Electronically signed by Orlie Pollen on 09/10/2021 at 5:56:50 PM.    Final    VAS [place] UPPER EXTREMITY VENOUS DUPLEX  Result Date: 09/27/2021 UPPER VENOUS STUDY  Patient Name:  GARELD OBRECHT  Date of Exam:   09/27/2021 Medical Rec #: [id number]        Accession #:    [id number] Date of Birth: 1951/03/19       Patient Gender: M Patient Age:   39 years Exam Location[facility]  [facility] Procedure:      VAS [place] UPPER EXTREMITY VENOUS DUPLEX Referring Phys: Duffy Rhody --------------------------------------------------------------------------------  Indications: Pain, and Swelling of left hand post op Limitations: Patient in chair. Comparison Study: No prior study Performing Technologist: Sharion Dove RVS  Examination Guidelines: A complete evaluation includes B-mode imaging, spectral Doppler, color Doppler, and power Doppler as needed of all accessible portions of each vessel. Bilateral testing is considered an integral part of a complete examination. Limited examinations for reoccurring indications may be performed as noted.  Right Findings: +----------+------------+---------+-----------+----------+-------+ RIGHT     CompressiblePhasicitySpontaneousPropertiesSummary +----------+------------+---------+-----------+----------+-------+ Subclavian               Yes       Yes                      +----------+------------+---------+-----------+----------+-------+  Left Findings: +----------+------------+---------+-----------+----------+-------+ LEFT       CompressiblePhasicitySpontaneousPropertiesSummary +----------+------------+---------+-----------+----------+-------+ IJV                      Yes       Yes                      +----------+------------+---------+-----------+----------+-------+ Subclavian               Yes       Yes                      +----------+------------+---------+-----------+----------+-------+ Axillary  Yes       Yes                      +----------+------------+---------+-----------+----------+-------+ Brachial      Full                                          +----------+------------+---------+-----------+----------+-------+ Radial        Full                                          +----------+------------+---------+-----------+----------+-------+ Ulnar         Full                                          +----------+------------+---------+-----------+----------+-------+ Cephalic      Full                                          +----------+------------+---------+-----------+----------+-------+ Basilic       Full                                          +----------+------------+---------+-----------+----------+-------+  Summary:  Right: No evidence of thrombosis in the subclavian.  Left: No evidence of deep vein thrombosis in the upper extremity. No evidence of superficial vein thrombosis in the upper extremity. Interstitial fluid noted left forearm and hand.  *See table(s) above for measurements and observations.  Diagnosing physician: Monica Martinez MD Electronically signed by Monica Martinez MD on 09/27/2021 at 3:49:58 PM.    Final    [place] EKG SITE RITE  Result Date: 09/12/2021 If Site Rite image not attached, placement could not be confirmed due to current cardiac rhythm.  (Echo, Carotid, EGD, Colonoscopy, ERCP)    Subjective: Patient seen and examined.  Denies any complaints.  Is excited to go home.  Heart rate is mostly controlled and  occasionally more than 100.  Denies any chest pain or shortness of breath.  Back pain is controlled.   Discharge Exam: Vitals:   10/01/21 0426 10/01/21 1127  BP: (!) 142/97 (!) 123/59  Pulse:  62  Resp: 13   Temp: 97.9 F (36.6 C) 98.6 F (37 C)  SpO2:  98%   Vitals:   09/30/21 2011 09/30/21 2345 10/01/21 0426 10/01/21 1127  BP: 129/73 122/79 (!) 142/97 (!) 123/59  Pulse:    62  Resp: $Remo'15 16 13   'INVwk$ Temp: 98.5 F (36.9 C) 98.8 F (37.1 C) 97.9 F (36.6 C) 98.6 F (37 C)  TempSrc: Oral Oral Oral Oral  SpO2:    98%  Weight:      Height:        General: Pt is alert, awake, not in acute distress Cardiovascular: RRR, S1/S2 +, no rubs, no gallops Respiratory: CTA bilaterally, no wheezing, no rhonchi Abdominal: Soft, NT, ND, bowel sounds + Extremities: no edema, no cyanosis Lumbar wound packed with wound VAC.  Minimal swelling left hand without any tenderness or erythema.    The  results of significant diagnostics from this hospitalization (including imaging, microbiology, ancillary and laboratory) are listed below for reference.     Microbiology: Recent Results (from the past 240 hour(s))  Blood culture (routine x 2)     Status: None   Collection Time: 09/23/21 12:53 AM   Specimen: BLOOD  Result Value Ref Range Status   Specimen Description BLOOD LEFT ANTECUBITAL  Final   Special Requests   Final    BOTTLES DRAWN AEROBIC AND ANAEROBIC Blood Culture adequate volume   Culture   Final    NO GROWTH 5 DAYS Performed at [facility], [address], [place], [place] [address]    Report Status 09/28/2021 FINAL  Final  Blood culture (routine x 2)     Status: None   Collection Time: 09/23/21  1:06 AM   Specimen: BLOOD RIGHT HAND  Result Value Ref Range Status   Specimen Description BLOOD RIGHT HAND  Final   Special Requests   Final    BOTTLES DRAWN AEROBIC AND ANAEROBIC Blood Culture adequate volume   Culture   Final    NO GROWTH 5 DAYS Performed at [facility], [place] [address] [place], [place] [address]    Report Status 09/28/2021 FINAL  Final  Aerobic/Anaerobic Culture w Gram Stain (surgical/deep wound)     Status: None   Collection Time: 09/23/21  3:41 AM   Specimen: Wound  Result Value Ref Range Status   Specimen Description WOUND  Final   Special Requests NONE  Final   Gram Stain NO WBC SEEN NO ORGANISMS SEEN   Final   Culture   Final    No growth aerobically or anaerobically. Performed at [facility], [place] [address] [place], [place] [address]    Report Status 09/28/2021 FINAL  Final  Surgical pcr screen     Status: None   Collection Time: 09/23/21  8:38 PM   Specimen: Nasal Mucosa; Nasal Swab  Result Value Ref Range Status   MRSA, PCR NEGATIVE NEGATIVE Final   Staphylococcus aureus NEGATIVE NEGATIVE Final    Comment: (NOTE) The Xpert SA Assay (FDA approved for NASAL specimens in patients 44 years of age and older), is one component of a comprehensive surveillance program. It is not intended to diagnose infection nor to guide or monitor treatment. Performed at [facility], [place] [address], [place], [place] [address]   Aerobic/Anaerobic Culture w Gram Stain (surgical/deep wound)     Status: None   Collection Time: 09/24/21  2:50 PM   Specimen: Wound  Result Value Ref Range Status   Specimen Description WOUND  Final   Special Requests LUMBAR  Final   Gram Stain   Final    MODERATE WBC PRESENT, PREDOMINANTLY PMN NO ORGANISMS SEEN    Culture   Final    No growth aerobically or anaerobically. Performed at [facility], [place] [address], [place], [place] [address]    Report Status 09/29/2021 FINAL  Final     Labs: BNP (last 3 results) Recent Labs    09/08/21 1002  BNP 203.5*   Basic Metabolic Panel: Recent Labs  Lab 09/25/21 1005 09/26/21 0217 09/27/21 0412 09/28/21 0258  NA 135 133* 134* 135  K 3.5 3.6 3.3* 3.6  CL 100 101 101 103  CO2 $Re'26 25 25 27  'Mpk$ GLUCOSE 192* 139* 126* 125*  BUN  6* 5* <5* 5*  CREATININE 0.76 0.66 0.61 0.70  CALCIUM 7.8* 7.5* 7.7*  7.9*  MG  --   --   --  1.8  PHOS  --   --   --  2.8   Liver Function Tests: No results for input(s): AST, ALT, ALKPHOS, BILITOT, PROT, ALBUMIN in the last 168 hours. No results for input(s): LIPASE, AMYLASE in the last 168 hours. No results for input(s): AMMONIA in the last 168 hours. CBC: Recent Labs  Lab 09/25/21 1005 09/26/21 0217 09/27/21 0412 09/28/21 0258 09/30/21 1423  WBC 6.9 6.1 5.3 4.6 5.2  NEUTROABS  --   --   --   --  3.2  HGB 9.4* 8.5* 8.4* 8.1* 8.7*  HCT 29.6* 26.6* 27.3* 25.7* 28.7*  MCV 88.9 87.5 91.0 89.5 92.0  PLT 331 341 383 371 504*   Cardiac Enzymes: No results for input(s): CKTOTAL, CKMB, CKMBINDEX, TROPONINI in the last 168 hours. BNP: Invalid input(s): POCBNP CBG: No results for input(s): GLUCAP in the last 168 hours. D-Dimer No results for input(s): DDIMER in the last 72 hours. Hgb A1c No results for input(s): HGBA1C in the last 72 hours. Lipid Profile No results for input(s): CHOL, HDL, LDLCALC, TRIG, CHOLHDL, LDLDIRECT in the last 72 hours. Thyroid function studies No results for input(s): TSH, T4TOTAL, T3FREE, THYROIDAB in the last 72 hours.  Invalid input(s): FREET3 Anemia work up No results for input(s): VITAMINB12, FOLATE, FERRITIN, TIBC, IRON, RETICCTPCT in the last 72 hours. Urinalysis    Component Value Date/Time   COLORURINE AMBER (A) 09/23/2021 0100   APPEARANCEUR CLEAR 09/23/2021 0100   LABSPEC 1.015 09/23/2021 0100   PHURINE 6.0 09/23/2021 0100   GLUCOSEU NEGATIVE 09/23/2021 0100   HGBUR NEGATIVE 09/23/2021 0100   BILIRUBINUR NEGATIVE 09/23/2021 0100   KETONESUR NEGATIVE 09/23/2021 0100   PROTEINUR NEGATIVE 09/23/2021 0100   NITRITE NEGATIVE 09/23/2021 0100   LEUKOCYTESUR NEGATIVE 09/23/2021 0100   Sepsis Labs Invalid input(s): PROCALCITONIN,  WBC,  LACTICIDVEN Microbiology Recent Results (from the past 240 hour(s))  Blood culture (routine x 2)      Status: None   Collection Time: 09/23/21 12:53 AM   Specimen: BLOOD  Result Value Ref Range Status   Specimen Description BLOOD LEFT ANTECUBITAL  Final   Special Requests   Final    BOTTLES DRAWN AEROBIC AND ANAEROBIC Blood Culture adequate volume   Culture   Final    NO GROWTH 5 DAYS Performed at [facility], [address] [place], [place] [address]    Report Status 09/28/2021 FINAL  Final  Blood culture (routine x 2)     Status: None   Collection Time: 09/23/21  1:06 AM   Specimen: BLOOD RIGHT HAND  Result Value Ref Range Status   Specimen Description BLOOD RIGHT HAND  Final   Special Requests   Final    BOTTLES DRAWN AEROBIC AND ANAEROBIC Blood Culture adequate volume   Culture   Final    NO GROWTH 5 DAYS Performed at [facility], [place] [address] [place], [place] [address]    Report Status 09/28/2021 FINAL  Final  Aerobic/Anaerobic Culture w Gram Stain (surgical/deep wound)     Status: None   Collection Time: 09/23/21  3:41 AM   Specimen: Wound  Result Value Ref Range Status   Specimen Description WOUND  Final   Special Requests NONE  Final   Gram Stain NO WBC SEEN NO ORGANISMS SEEN   Final   Culture   Final    No growth aerobically or anaerobically. Performed at [facility], [address], [place], [place] [address]    Report Status 09/28/2021 FINAL  Final  Surgical pcr screen     Status: None   Collection Time: 09/23/21  8:38 PM   Specimen: Nasal Mucosa; Nasal Swab  Result Value Ref Range Status   MRSA, PCR NEGATIVE NEGATIVE Final   Staphylococcus aureus NEGATIVE NEGATIVE Final    Comment: (NOTE) The Xpert SA Assay (FDA approved for NASAL specimens in patients 82 years of age and older), is one component of a comprehensive surveillance program. It is not intended to diagnose infection nor to guide or monitor treatment. Performed at [facility], [place] [address], [place], [place] [address]   Aerobic/Anaerobic Culture w Gram Stain  (surgical/deep wound)     Status: None   Collection Time: 09/24/21  2:50 PM   Specimen: Wound  Result Value Ref Range Status   Specimen Description WOUND  Final   Special Requests LUMBAR  Final   Gram Stain   Final    MODERATE WBC PRESENT, PREDOMINANTLY PMN NO ORGANISMS SEEN    Culture   Final    No growth aerobically or anaerobically. Performed at [facility], [place] [address] [place], [place] [address]    Report Status 09/29/2021 FINAL  Final     Time coordinating discharge:  35 minutes  SIGNED:   Barb Merino, MD  [facility] 10/01/2021, 11:51 AM

## 2021-10-01 NOTE — Progress Notes (Signed)
Subjective: Patient reports that he is doing well overall. No acute events overnight.   Objective: Vital signs in last 24 hours: Temp:  [97.6 F (36.4 C)-98.8 F (37.1 C)] 97.9 F (36.6 C) (10/28 0426) Pulse Rate:  [69-90] 80 (10/27 1548) Resp:  [13-20] 13 (10/28 0426) BP: (118-142)/(66-97) 142/97 (10/28 0426) SpO2:  [97 %-99 %] 99 % (10/27 1548)  Intake/Output from previous day: 10/27 0701 - 10/28 0700 In: 1000 [P.O.:800; IV Piggyback:200] Out: 500 [Urine:500] Intake/Output this shift: No intake/output data recorded.  Physical Exam: Patient is awake, A/O X 4, and conversant. He is sitting up on the side of his bed with his LSO brace on. He is in NAD and VSS. Speech is fluent and appropriate. MAEW with good strength that is symmetric bilaterally. 5/5 BUE/BLE. Sensation intact. PERLA, EOMI. CNs grossly intact. Wound vac in place and is CDI.  Lab Results: Recent Labs    09/30/21 1423  WBC 5.2  HGB 8.7*  HCT 28.7*  PLT 504*   BMET No results for input(s): NA, K, CL, CO2, GLUCOSE, BUN, CREATININE, CALCIUM in the last 72 hours.  Studies/Results: No results found.  Assessment/Plan: Patient is s/p Incision and debridement of lumbar wound and wound vac placement. He is continuing to recover well. Neuro exam remains at baseline. Left hand pain and swelling continues to improve. His mood has continued to improve and is in better spirits this morning. He is having difficulty adjusting to his current situation and the thought of prolonged use of IV abx and lumbar wound vac. support he would receive once he is discharged. 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  He is stable form a neurosurgical perspective and ready to discharge home with appropriate home health support. Pain medications and Robaxin have bent sent to the patient's pharmacy. We will  have him follow up with [place] in the office.      -Abx per ID, continue cefazolin followed by protracted Keflex -Continue Wound vac  -Please record wound vac output every shift -Continue working with PT/OT -Encourage mobility  -Continue working on pain control  LOS: 8 days     Council Mechanic, DNP, NP-C 10/01/2021, 8:04 AM

## 2021-10-04 DIAGNOSIS — U071 COVID-19: Secondary | ICD-10-CM | POA: Diagnosis not present

## 2021-10-04 DIAGNOSIS — T8149XA Infection following a procedure, other surgical site, initial encounter: Secondary | ICD-10-CM | POA: Diagnosis not present

## 2021-10-04 DIAGNOSIS — I110 Hypertensive heart disease with heart failure: Secondary | ICD-10-CM | POA: Diagnosis not present

## 2021-10-04 DIAGNOSIS — J9601 Acute respiratory failure with hypoxia: Secondary | ICD-10-CM | POA: Diagnosis not present

## 2021-10-04 DIAGNOSIS — B9561 Methicillin susceptible Staphylococcus aureus infection as the cause of diseases classified elsewhere: Secondary | ICD-10-CM | POA: Diagnosis not present

## 2021-10-04 DIAGNOSIS — R6520 Severe sepsis without septic shock: Secondary | ICD-10-CM | POA: Diagnosis not present

## 2021-10-04 DIAGNOSIS — J1282 Pneumonia due to coronavirus disease 2019: Secondary | ICD-10-CM | POA: Diagnosis not present

## 2021-10-04 DIAGNOSIS — T8144XA Sepsis following a procedure, initial encounter: Secondary | ICD-10-CM | POA: Diagnosis not present

## 2021-10-04 DIAGNOSIS — I501 Left ventricular failure: Secondary | ICD-10-CM | POA: Diagnosis not present

## 2021-10-06 ENCOUNTER — Encounter: Payer: Self-pay | Admitting: Internal Medicine

## 2021-10-06 DIAGNOSIS — I110 Hypertensive heart disease with heart failure: Secondary | ICD-10-CM | POA: Diagnosis not present

## 2021-10-06 DIAGNOSIS — T8149XA Infection following a procedure, other surgical site, initial encounter: Secondary | ICD-10-CM | POA: Diagnosis not present

## 2021-10-06 DIAGNOSIS — I501 Left ventricular failure: Secondary | ICD-10-CM | POA: Diagnosis not present

## 2021-10-06 DIAGNOSIS — J1282 Pneumonia due to coronavirus disease 2019: Secondary | ICD-10-CM | POA: Diagnosis not present

## 2021-10-06 DIAGNOSIS — B9561 Methicillin susceptible Staphylococcus aureus infection as the cause of diseases classified elsewhere: Secondary | ICD-10-CM | POA: Diagnosis not present

## 2021-10-06 DIAGNOSIS — U071 COVID-19: Secondary | ICD-10-CM | POA: Diagnosis not present

## 2021-10-06 DIAGNOSIS — T8144XA Sepsis following a procedure, initial encounter: Secondary | ICD-10-CM | POA: Diagnosis not present

## 2021-10-06 DIAGNOSIS — J9601 Acute respiratory failure with hypoxia: Secondary | ICD-10-CM | POA: Diagnosis not present

## 2021-10-06 DIAGNOSIS — R6520 Severe sepsis without septic shock: Secondary | ICD-10-CM | POA: Diagnosis not present

## 2021-10-08 DIAGNOSIS — T8144XA Sepsis following a procedure, initial encounter: Secondary | ICD-10-CM | POA: Diagnosis not present

## 2021-10-08 DIAGNOSIS — I501 Left ventricular failure: Secondary | ICD-10-CM | POA: Diagnosis not present

## 2021-10-08 DIAGNOSIS — J9601 Acute respiratory failure with hypoxia: Secondary | ICD-10-CM | POA: Diagnosis not present

## 2021-10-08 DIAGNOSIS — B9561 Methicillin susceptible Staphylococcus aureus infection as the cause of diseases classified elsewhere: Secondary | ICD-10-CM | POA: Diagnosis not present

## 2021-10-08 DIAGNOSIS — A419 Sepsis, unspecified organism: Secondary | ICD-10-CM | POA: Diagnosis not present

## 2021-10-08 DIAGNOSIS — I110 Hypertensive heart disease with heart failure: Secondary | ICD-10-CM | POA: Diagnosis not present

## 2021-10-08 DIAGNOSIS — J1282 Pneumonia due to coronavirus disease 2019: Secondary | ICD-10-CM | POA: Diagnosis not present

## 2021-10-08 DIAGNOSIS — T8149XA Infection following a procedure, other surgical site, initial encounter: Secondary | ICD-10-CM | POA: Diagnosis not present

## 2021-10-08 DIAGNOSIS — R6520 Severe sepsis without septic shock: Secondary | ICD-10-CM | POA: Diagnosis not present

## 2021-10-08 DIAGNOSIS — M4620 Osteomyelitis of vertebra, site unspecified: Secondary | ICD-10-CM | POA: Diagnosis not present

## 2021-10-08 DIAGNOSIS — U071 COVID-19: Secondary | ICD-10-CM | POA: Diagnosis not present

## 2021-10-11 DIAGNOSIS — T8141XA Infection following a procedure, superficial incisional surgical site, initial encounter: Secondary | ICD-10-CM | POA: Diagnosis not present

## 2021-10-11 DIAGNOSIS — I110 Hypertensive heart disease with heart failure: Secondary | ICD-10-CM | POA: Diagnosis not present

## 2021-10-11 DIAGNOSIS — R6520 Severe sepsis without septic shock: Secondary | ICD-10-CM | POA: Diagnosis not present

## 2021-10-11 DIAGNOSIS — I501 Left ventricular failure: Secondary | ICD-10-CM | POA: Diagnosis not present

## 2021-10-11 DIAGNOSIS — T8149XA Infection following a procedure, other surgical site, initial encounter: Secondary | ICD-10-CM | POA: Diagnosis not present

## 2021-10-11 DIAGNOSIS — U071 COVID-19: Secondary | ICD-10-CM | POA: Diagnosis not present

## 2021-10-11 DIAGNOSIS — B9561 Methicillin susceptible Staphylococcus aureus infection as the cause of diseases classified elsewhere: Secondary | ICD-10-CM | POA: Diagnosis not present

## 2021-10-11 DIAGNOSIS — J9601 Acute respiratory failure with hypoxia: Secondary | ICD-10-CM | POA: Diagnosis not present

## 2021-10-11 DIAGNOSIS — T8144XA Sepsis following a procedure, initial encounter: Secondary | ICD-10-CM | POA: Diagnosis not present

## 2021-10-11 DIAGNOSIS — J1282 Pneumonia due to coronavirus disease 2019: Secondary | ICD-10-CM | POA: Diagnosis not present

## 2021-10-12 ENCOUNTER — Encounter (HOSPITAL_COMMUNITY): Payer: Self-pay | Admitting: Radiology

## 2021-10-12 ENCOUNTER — Telehealth: Payer: Self-pay

## 2021-10-12 NOTE — Telephone Encounter (Signed)
Patient called clinic again during lunch hours; returned call and LVM again.

## 2021-10-12 NOTE — Telephone Encounter (Signed)
LVM on phone number listed above to call us back and discuss lab results. Thanks!

## 2021-10-12 NOTE — Telephone Encounter (Signed)
Patient's wife called wanting to know lab results from home health. She also says that the swelling in his legs and feet has gotten worse since coming home from the hospital and wants to know if that has anything to do with his antibiotic treatment. Will route to provider.   Sandie Ano, RN

## 2021-10-13 DIAGNOSIS — U071 COVID-19: Secondary | ICD-10-CM | POA: Diagnosis not present

## 2021-10-13 DIAGNOSIS — J1282 Pneumonia due to coronavirus disease 2019: Secondary | ICD-10-CM | POA: Diagnosis not present

## 2021-10-13 DIAGNOSIS — J9601 Acute respiratory failure with hypoxia: Secondary | ICD-10-CM | POA: Diagnosis not present

## 2021-10-13 DIAGNOSIS — T8144XA Sepsis following a procedure, initial encounter: Secondary | ICD-10-CM | POA: Diagnosis not present

## 2021-10-13 DIAGNOSIS — R6520 Severe sepsis without septic shock: Secondary | ICD-10-CM | POA: Diagnosis not present

## 2021-10-13 DIAGNOSIS — B9561 Methicillin susceptible Staphylococcus aureus infection as the cause of diseases classified elsewhere: Secondary | ICD-10-CM | POA: Diagnosis not present

## 2021-10-13 DIAGNOSIS — I501 Left ventricular failure: Secondary | ICD-10-CM | POA: Diagnosis not present

## 2021-10-13 DIAGNOSIS — I110 Hypertensive heart disease with heart failure: Secondary | ICD-10-CM | POA: Diagnosis not present

## 2021-10-13 DIAGNOSIS — T8149XA Infection following a procedure, other surgical site, initial encounter: Secondary | ICD-10-CM | POA: Diagnosis not present

## 2021-10-13 NOTE — Telephone Encounter (Signed)
Thank you Dr. Daiva Eves - definitely agree with you.

## 2021-10-13 NOTE — Telephone Encounter (Signed)
Here are most recent labs:  11/7 WBC 4.9, SCr 0.6, ESR 57, CRP 28 11/2 WBC 5.8, SCr 0.7, ESR 70, CRP 21  10/17 WBC 7.7, SCr 0.72, ESR 99, CRP 137

## 2021-10-15 DIAGNOSIS — U071 COVID-19: Secondary | ICD-10-CM | POA: Diagnosis not present

## 2021-10-15 DIAGNOSIS — J1282 Pneumonia due to coronavirus disease 2019: Secondary | ICD-10-CM | POA: Diagnosis not present

## 2021-10-15 DIAGNOSIS — B9561 Methicillin susceptible Staphylococcus aureus infection as the cause of diseases classified elsewhere: Secondary | ICD-10-CM | POA: Diagnosis not present

## 2021-10-15 DIAGNOSIS — R6520 Severe sepsis without septic shock: Secondary | ICD-10-CM | POA: Diagnosis not present

## 2021-10-15 DIAGNOSIS — M419 Scoliosis, unspecified: Secondary | ICD-10-CM | POA: Diagnosis not present

## 2021-10-15 DIAGNOSIS — A419 Sepsis, unspecified organism: Secondary | ICD-10-CM | POA: Diagnosis not present

## 2021-10-15 DIAGNOSIS — M5416 Radiculopathy, lumbar region: Secondary | ICD-10-CM | POA: Diagnosis not present

## 2021-10-15 DIAGNOSIS — T8144XA Sepsis following a procedure, initial encounter: Secondary | ICD-10-CM | POA: Diagnosis not present

## 2021-10-15 DIAGNOSIS — J9601 Acute respiratory failure with hypoxia: Secondary | ICD-10-CM | POA: Diagnosis not present

## 2021-10-15 DIAGNOSIS — T8149XA Infection following a procedure, other surgical site, initial encounter: Secondary | ICD-10-CM | POA: Diagnosis not present

## 2021-10-15 DIAGNOSIS — L089 Local infection of the skin and subcutaneous tissue, unspecified: Secondary | ICD-10-CM | POA: Diagnosis not present

## 2021-10-15 DIAGNOSIS — I501 Left ventricular failure: Secondary | ICD-10-CM | POA: Diagnosis not present

## 2021-10-15 DIAGNOSIS — M4620 Osteomyelitis of vertebra, site unspecified: Secondary | ICD-10-CM | POA: Diagnosis not present

## 2021-10-15 DIAGNOSIS — T148XXA Other injury of unspecified body region, initial encounter: Secondary | ICD-10-CM | POA: Diagnosis not present

## 2021-10-15 DIAGNOSIS — I110 Hypertensive heart disease with heart failure: Secondary | ICD-10-CM | POA: Diagnosis not present

## 2021-10-15 DIAGNOSIS — S32009K Unspecified fracture of unspecified lumbar vertebra, subsequent encounter for fracture with nonunion: Secondary | ICD-10-CM | POA: Diagnosis not present

## 2021-10-18 ENCOUNTER — Encounter: Payer: Self-pay | Admitting: Internal Medicine

## 2021-10-18 DIAGNOSIS — D6489 Other specified anemias: Secondary | ICD-10-CM | POA: Diagnosis not present

## 2021-10-18 DIAGNOSIS — T8141XA Infection following a procedure, superficial incisional surgical site, initial encounter: Secondary | ICD-10-CM | POA: Diagnosis not present

## 2021-10-18 DIAGNOSIS — T84296D Other mechanical complication of internal fixation device of vertebrae, subsequent encounter: Secondary | ICD-10-CM | POA: Diagnosis not present

## 2021-10-18 DIAGNOSIS — M48061 Spinal stenosis, lumbar region without neurogenic claudication: Secondary | ICD-10-CM | POA: Diagnosis not present

## 2021-10-18 DIAGNOSIS — I501 Left ventricular failure: Secondary | ICD-10-CM | POA: Diagnosis not present

## 2021-10-18 DIAGNOSIS — A4101 Sepsis due to Methicillin susceptible Staphylococcus aureus: Secondary | ICD-10-CM | POA: Diagnosis not present

## 2021-10-18 DIAGNOSIS — T8144XA Sepsis following a procedure, initial encounter: Secondary | ICD-10-CM | POA: Diagnosis not present

## 2021-10-18 DIAGNOSIS — I471 Supraventricular tachycardia: Secondary | ICD-10-CM | POA: Diagnosis not present

## 2021-10-18 DIAGNOSIS — I110 Hypertensive heart disease with heart failure: Secondary | ICD-10-CM | POA: Diagnosis not present

## 2021-10-21 DIAGNOSIS — R5381 Other malaise: Secondary | ICD-10-CM | POA: Diagnosis not present

## 2021-10-21 DIAGNOSIS — M545 Low back pain, unspecified: Secondary | ICD-10-CM | POA: Diagnosis not present

## 2021-10-21 DIAGNOSIS — R609 Edema, unspecified: Secondary | ICD-10-CM | POA: Diagnosis not present

## 2021-10-21 DIAGNOSIS — Z8619 Personal history of other infectious and parasitic diseases: Secondary | ICD-10-CM | POA: Diagnosis not present

## 2021-10-21 DIAGNOSIS — R000 Tachycardia, unspecified: Secondary | ICD-10-CM | POA: Diagnosis not present

## 2021-10-22 ENCOUNTER — Encounter: Payer: Self-pay | Admitting: Infectious Disease

## 2021-10-22 ENCOUNTER — Ambulatory Visit (INDEPENDENT_AMBULATORY_CARE_PROVIDER_SITE_OTHER): Payer: Medicare PPO | Admitting: Infectious Disease

## 2021-10-22 ENCOUNTER — Other Ambulatory Visit: Payer: Self-pay

## 2021-10-22 VITALS — BP 129/66 | HR 70 | Temp 98.1°F | Wt 208.0 lb

## 2021-10-22 DIAGNOSIS — A4101 Sepsis due to Methicillin susceptible Staphylococcus aureus: Secondary | ICD-10-CM

## 2021-10-22 DIAGNOSIS — R7881 Bacteremia: Secondary | ICD-10-CM

## 2021-10-22 DIAGNOSIS — L0291 Cutaneous abscess, unspecified: Secondary | ICD-10-CM | POA: Diagnosis not present

## 2021-10-22 DIAGNOSIS — R6520 Severe sepsis without septic shock: Secondary | ICD-10-CM | POA: Diagnosis not present

## 2021-10-22 DIAGNOSIS — T8149XA Infection following a procedure, other surgical site, initial encounter: Secondary | ICD-10-CM | POA: Diagnosis not present

## 2021-10-22 DIAGNOSIS — B9561 Methicillin susceptible Staphylococcus aureus infection as the cause of diseases classified elsewhere: Secondary | ICD-10-CM | POA: Diagnosis not present

## 2021-10-22 DIAGNOSIS — T847XXD Infection and inflammatory reaction due to other internal orthopedic prosthetic devices, implants and grafts, subsequent encounter: Secondary | ICD-10-CM | POA: Diagnosis not present

## 2021-10-22 DIAGNOSIS — R600 Localized edema: Secondary | ICD-10-CM | POA: Diagnosis not present

## 2021-10-22 DIAGNOSIS — J9601 Acute respiratory failure with hypoxia: Secondary | ICD-10-CM

## 2021-10-22 DIAGNOSIS — U071 COVID-19: Secondary | ICD-10-CM

## 2021-10-22 HISTORY — DX: Localized edema: R60.0

## 2021-10-22 MED ORDER — CEFADROXIL 500 MG PO CAPS: 1000.0000 mg | ORAL_CAPSULE | Freq: Two times a day (BID) | ORAL | 11 refills | Status: DC

## 2021-10-22 NOTE — Progress Notes (Signed)
 Subjective:  Chief complaint worsening lower extremity edema  Patient ID: Michael Hill, male    DOB: 07/07/1951, 70 y.o.   MRN: [id number]  HPI  70-year-old Caucasian man who had had final stenosis and lumbar radiculopathy status post L5-S1 decompression and fusion on September 21 then admitted October 5 with infection of his surgical site and frank bacteremia with MSSA and severe sepsis.  Blood cultures cleared he was ultimately placed on cefazolin and then rifampin was added.  As an outpatient he did not do very well on the rifampin with nausea malaise and poor appetite.  He then came back to the ER with worsening back pain and right lower extremity weakness and drainage from the wound.  MRI of the spine was performed which showed enhancing fluid collection in the soft tissues posterior lower lumbar spine was 11 x 1 x 3.3 cm in dimensions.  Patient was taken the operating room by Dr. Dawley who found purulence extending down to hardware.  Patient was initially switched to nafcillin but then switch back to cefazolin.  We abandon the idea of giving him any rifampin due to his poor tolerance of this is in the outpatient world.  He ultimately has been discharged to home and is receiving cefazolin at home.  He has had worsening lower extremity edema while at home and is wonder if this was related to antibiotics the only way that I can think it is related the antibiotics is that he is getting salt saline infusions with the cefazolin dose during the day.  Certainly his acute infection and potentially low albumin from inflammation and/or not optimal nutrition could also be created to third spacing of fluid and his lower extremity edema.    He does have marked lower extremity edema on exam  Back pain is improved pneumatically.  Vacuum dressing has been removed.    Past Medical History:  Diagnosis Date   Arthritis    Chronic back pain    scoliosis/stenosis/spondylosis   History  of bronchitis    many yrs ago   History of shingles    Hyperlipidemia    takes Lipitor daily   Hypertension    takes Lotrel daily    Past Surgical History:  Procedure Laterality Date   ABDOMINAL EXPOSURE N/A 08/05/2014   Procedure: ABDOMINAL EXPOSURE;  Surgeon: Christopher S Dickson, MD;  Location: [facility];  Service: Vascular;  Laterality: N/A;   ANTERIOR LAT LUMBAR FUSION Right 08/05/2014   Procedure: Right Lumbar two-three,Lumbar three-four,Lumbar four-five Anterior lateral lumbar interbody fusion;  Surgeon: Joseph Stern, MD;  Location: [facility];  Service: Neurosurgery;  Laterality: Right;  right    ANTERIOR LUMBAR FUSION N/A 08/05/2014   Procedure: Lumbar five-Sacral-One Anterior lumbar interbody fusion with Dr. Dickson for approach; Right Lumbar two-three,Lumbar three-four,Lumbar four-five Anterior lateral lumbar interbody fusion;  Surgeon: Joseph Stern, MD;  Location: [facility];  Service: Neurosurgery;  Laterality: N/A;   APPLICATION OF INTRAOPERATIVE CT SCAN N/A 08/24/2021   Procedure: APPLICATION OF INTRAOPERATIVE CT SCAN;  Surgeon: Stern, Joseph, MD;  Location: [facility];  Service: Neurosurgery;  Laterality: N/A;   APPLICATION OF WOUND VAC N/A 09/24/2021   Procedure: APPLICATION OF WOUND VAC;  Surgeon: Dawley, Troy C, DO;  Location: [facility];  Service: Neurosurgery;  Laterality: N/A;   COLONOSCOPY     EPIDURAL BLOCK INJECTION     KNEE ARTHROSCOPY Bilateral    x 4    LUMBAR FUSION  08/05/2014          DR STERN   LUMBAR PERCUTANEOUS PEDICLE SCREW 3 LEVEL N/A 08/07/2014   Procedure: Stage II Lumbar percutaneous pedicle screw placement L2-S1;  Surgeon: Erline Levine, MD;  Location: [facility];  Service: Neurosurgery;  Laterality: N/A;  Stage II Lumbar percutaneous pedicle screw placement L2-S1   LUMBAR WOUND DEBRIDEMENT N/A 09/08/2021   Procedure: Incision and drainage of lumbar wound;  Surgeon: Erline Levine, MD;  Location: [facility];  Service: Neurosurgery;  Laterality: N/A;   PROSTATE  BIOPSY     ROTATOR CUFF REPAIR Right    TEE WITHOUT CARDIOVERSION N/A 09/14/2021   Procedure: TRANSESOPHAGEAL ECHOCARDIOGRAM (TEE);  Surgeon: Lelon Perla, MD;  Location: [facility];  Service: Cardiovascular;  Laterality: N/A;   WOUND EXPLORATION N/A 09/24/2021   Procedure: Lumbar Wound Irrigation and Debridement.;  Surgeon: Karsten Ro, DO;  Location: [facility];  Service: Neurosurgery;  Laterality: N/A;  Wound appplied    Family History  Problem Relation Age of Onset   Heart disease Father        before age 24   Hyperlipidemia Father    Hypertension Father    Hypertension Brother    Heart disease Brother        before age 3   Cancer Daughter       Social History   Socioeconomic History   Marital status: Married    Spouse name: Not on file   Number of children: Not on file   Years of education: Not on file   Highest education level: Not on file  Occupational History   Not on file  Tobacco Use   Smoking status: Never   Smokeless tobacco: Never  Vaping Use   Vaping Use: Never used  Substance and Sexual Activity   Alcohol use: No   Drug use: No   Sexual activity: Not Currently  Other Topics Concern   Not on file  Social History Narrative   Not on file   Social Determinants of Health   Financial Resource Strain: Not on file  Food Insecurity: Not on file  Transportation Needs: Not on file  Physical Activity: Not on file  Stress: Not on file  Social Connections: Not on file    No Known Allergies   Current Outpatient Medications:    acetaminophen (TYLENOL) 325 MG tablet, Take 325 mg by mouth every 6 (six) hours as needed for moderate pain, headache or mild pain., Disp: , Rfl:    atorvastatin (LIPITOR) 20 MG tablet, Take 20 mg by mouth daily., Disp: , Rfl:    ceFAZolin (ANCEF) IVPB, Inject 2 g into the vein every 8 (eight) hours. Indication:  Lumbar Infection- MSSA First Dose: Yes Last Day of Therapy:  11/05/21 Labs - Once weekly:  CBC/D and BMP, Labs -  Every other week:  ESR and CRP Method of administration: IV Push Method of administration may be changed at the discretion of home infusion pharmacist based upon assessment of the patient and/or caregiver's ability to self-administer the medication ordered., Disp: 114 Units, Rfl: 0   methocarbamol (ROBAXIN) 500 MG tablet, Take 1 tablet (500 mg total) by mouth every 8 (eight) hours as needed for muscle spasms., Disp: 90 tablet, Rfl: 0   metoprolol tartrate (LOPRESSOR) 50 MG tablet, Take 1 tablet (50 mg total) by mouth 2 (two) times daily., Disp: 60 tablet, Rfl: 2   oxyCODONE (OXY IR/ROXICODONE) 5 MG immediate release tablet, Take 1-2 tablets (5-10 mg total) by mouth every 3 (three) hours as needed for moderate pain., Disp:  30 tablet, Rfl: 0   docusate sodium (COLACE) 100 MG capsule, Take 1 capsule (100 mg total) by mouth 2 (two) times daily. (Patient not taking: Reported on 10/22/2021), Disp: 10 capsule, Rfl: 0   famotidine (PEPCID) 20 MG tablet, Take 1 tablet (20 mg total) by mouth daily. (Patient not taking: Reported on 10/22/2021), Disp: 30 tablet, Rfl: 0   oxyCODONE (OXYCONTIN) 20 mg 12 hr tablet, Take 1 tablet (20 mg total) by mouth every 12 (twelve) hours. (Patient not taking: Reported on 10/22/2021), Disp: 14 tablet, Rfl: 0   Review of Systems  Constitutional:  Negative for activity change, appetite change, chills, diaphoresis, fatigue, fever and unexpected weight change.  HENT:  Negative for congestion, rhinorrhea, sinus pressure, sneezing, sore throat and trouble swallowing.   Eyes:  Negative for photophobia and visual disturbance.  Respiratory:  Negative for cough, chest tightness, shortness of breath, wheezing and stridor.   Cardiovascular:  Positive for leg swelling. Negative for chest pain and palpitations.  Gastrointestinal:  Negative for abdominal distention, abdominal pain, anal bleeding, blood in stool, constipation, diarrhea, nausea and vomiting.  Genitourinary:  Negative for  difficulty urinating, dysuria, flank pain and hematuria.  Musculoskeletal:  Positive for back pain and myalgias. Negative for arthralgias, gait problem and joint swelling.  Skin:  Positive for wound. Negative for color change, pallor and rash.  Neurological:  Negative for dizziness, tremors, weakness and light-headedness.  Hematological:  Negative for adenopathy. Does not bruise/bleed easily.  Psychiatric/Behavioral:  Negative for agitation, behavioral problems, confusion, decreased concentration, dysphoric mood and sleep disturbance.       Objective:   Physical Exam Constitutional:      Appearance: He is well-developed.  HENT:     Head: Normocephalic and atraumatic.  Eyes:     Conjunctiva/sclera: Conjunctivae normal.  Cardiovascular:     Rate and Rhythm: Normal rate and regular rhythm.  Pulmonary:     Effort: Pulmonary effort is normal. No respiratory distress.     Breath sounds: No wheezing.  Abdominal:     General: There is no distension.     Palpations: Abdomen is soft.  Musculoskeletal:        General: No tenderness. Normal range of motion.     Cervical back: Normal range of motion and neck supple.  Skin:    General: Skin is warm and dry.     Coloration: Skin is not pale.     Findings: No erythema or rash.  Neurological:     General: No focal deficit present.     Mental Status: He is alert and oriented to person, place, and time.  Psychiatric:        Mood and Affect: Mood normal.        Behavior: Behavior normal.        Thought Content: Thought content normal.        Judgment: Judgment normal.    PICC line October 22, 2021:     Lumbar wound October 22, 2021:       Assessment & Plan:   MSSA associated lumbar wound infection that extended down to the hardware and also had caused bacteremia:  I have reviewed his sed rate CRP BMP with GFR CBC with differential from advanced home care that is shown a reduction in his sed rate down into the 70s from values well  over 130 in the inpatient world.   Continue cefazolin to complete therapy for 6 weeks then switch over to cefadroxil to 500 mg to be taken twice   daily for a minimum of a year but more likely several years given the hardware involvement  I sent prescription for cefadroxil to his pharmacy.  We have given instructions for his home health team to pull his PICC line at the termination of his therapy.  Lower extremity edema: Likely multifactorial but not likely caused by IV abx at all'  COVID-19 infection: Was discovered during one of his hospitalizations he is recovered  I spent more than 41minutes with the patient including face to face counseling of the patient his wife regarding the nature of deep infections involving hardware regarding biofilms, regarding bacteremia as an mortality involved regarding multiple causes of lower extremity edema personally reviewing MRI of the spine performed, CBC BMP sed rate CRP cultures in the hospital stay along with review of medical records before and during the visit and in coordination of his care.   

## 2021-10-25 ENCOUNTER — Encounter: Payer: Self-pay | Admitting: Internal Medicine

## 2021-10-25 DIAGNOSIS — I501 Left ventricular failure: Secondary | ICD-10-CM | POA: Diagnosis not present

## 2021-10-25 DIAGNOSIS — A4101 Sepsis due to Methicillin susceptible Staphylococcus aureus: Secondary | ICD-10-CM | POA: Diagnosis not present

## 2021-10-25 DIAGNOSIS — D6489 Other specified anemias: Secondary | ICD-10-CM | POA: Diagnosis not present

## 2021-10-25 DIAGNOSIS — M48061 Spinal stenosis, lumbar region without neurogenic claudication: Secondary | ICD-10-CM | POA: Diagnosis not present

## 2021-10-25 DIAGNOSIS — T8144XA Sepsis following a procedure, initial encounter: Secondary | ICD-10-CM | POA: Diagnosis not present

## 2021-10-25 DIAGNOSIS — T8141XA Infection following a procedure, superficial incisional surgical site, initial encounter: Secondary | ICD-10-CM | POA: Diagnosis not present

## 2021-10-25 DIAGNOSIS — T84296D Other mechanical complication of internal fixation device of vertebrae, subsequent encounter: Secondary | ICD-10-CM | POA: Diagnosis not present

## 2021-10-25 DIAGNOSIS — I471 Supraventricular tachycardia: Secondary | ICD-10-CM | POA: Diagnosis not present

## 2021-10-25 DIAGNOSIS — I110 Hypertensive heart disease with heart failure: Secondary | ICD-10-CM | POA: Diagnosis not present

## 2021-10-29 DIAGNOSIS — A419 Sepsis, unspecified organism: Secondary | ICD-10-CM | POA: Diagnosis not present

## 2021-10-29 DIAGNOSIS — M4620 Osteomyelitis of vertebra, site unspecified: Secondary | ICD-10-CM | POA: Diagnosis not present

## 2021-11-01 ENCOUNTER — Encounter: Payer: Self-pay | Admitting: Internal Medicine

## 2021-11-01 DIAGNOSIS — D6489 Other specified anemias: Secondary | ICD-10-CM | POA: Diagnosis not present

## 2021-11-01 DIAGNOSIS — I110 Hypertensive heart disease with heart failure: Secondary | ICD-10-CM | POA: Diagnosis not present

## 2021-11-01 DIAGNOSIS — T8141XA Infection following a procedure, superficial incisional surgical site, initial encounter: Secondary | ICD-10-CM | POA: Diagnosis not present

## 2021-11-01 DIAGNOSIS — T84296D Other mechanical complication of internal fixation device of vertebrae, subsequent encounter: Secondary | ICD-10-CM | POA: Diagnosis not present

## 2021-11-01 DIAGNOSIS — T8144XA Sepsis following a procedure, initial encounter: Secondary | ICD-10-CM | POA: Diagnosis not present

## 2021-11-01 DIAGNOSIS — I501 Left ventricular failure: Secondary | ICD-10-CM | POA: Diagnosis not present

## 2021-11-01 DIAGNOSIS — M48061 Spinal stenosis, lumbar region without neurogenic claudication: Secondary | ICD-10-CM | POA: Diagnosis not present

## 2021-11-01 DIAGNOSIS — I471 Supraventricular tachycardia: Secondary | ICD-10-CM | POA: Diagnosis not present

## 2021-11-01 DIAGNOSIS — A4101 Sepsis due to Methicillin susceptible Staphylococcus aureus: Secondary | ICD-10-CM | POA: Diagnosis not present

## 2021-11-08 DIAGNOSIS — T8141XA Infection following a procedure, superficial incisional surgical site, initial encounter: Secondary | ICD-10-CM | POA: Diagnosis not present

## 2021-11-08 DIAGNOSIS — I501 Left ventricular failure: Secondary | ICD-10-CM | POA: Diagnosis not present

## 2021-11-08 DIAGNOSIS — M48061 Spinal stenosis, lumbar region without neurogenic claudication: Secondary | ICD-10-CM | POA: Diagnosis not present

## 2021-11-08 DIAGNOSIS — I471 Supraventricular tachycardia: Secondary | ICD-10-CM | POA: Diagnosis not present

## 2021-11-08 DIAGNOSIS — I110 Hypertensive heart disease with heart failure: Secondary | ICD-10-CM | POA: Diagnosis not present

## 2021-11-08 DIAGNOSIS — A4101 Sepsis due to Methicillin susceptible Staphylococcus aureus: Secondary | ICD-10-CM | POA: Diagnosis not present

## 2021-11-08 DIAGNOSIS — D6489 Other specified anemias: Secondary | ICD-10-CM | POA: Diagnosis not present

## 2021-11-08 DIAGNOSIS — T8144XA Sepsis following a procedure, initial encounter: Secondary | ICD-10-CM | POA: Diagnosis not present

## 2021-11-08 DIAGNOSIS — T84296D Other mechanical complication of internal fixation device of vertebrae, subsequent encounter: Secondary | ICD-10-CM | POA: Diagnosis not present

## 2021-11-08 NOTE — Progress Notes (Signed)
Cardiology Office Note:    Date:  11/08/2021   ID:  Michael Hill, DOB 1951/03/26, MRN [id number]  PCP:  Tally Joe, MD   [facility] Providers Cardiologist:  Alverda Skeans, MD Referring MD: Tally Joe, MD   Chief Complaint/Reason for Referral: Supraventricular tachycardia  ASSESSMENT & PLAN:    SVT (supraventricular tachycardia) (HCC)  The patient developed SVT and sinus tachycardia during hospitalizations for his wound infection.  He was started on metoprolol.  His variable heart rate is likely due to his underlying issues including sepsis, wound infection, deconditioning, and pain.  It may be best to focus further interventions on these underlying causes of tachycardia rather than treating with metoprolol.  Now that his infection has been adequately treated his heart rate is now back to the normal range.  I am not sure whether he requires metoprolol.  He has a history of occasional PVCs.  I will stop his metoprolol now.  I will place a monitor to evaluate for any rhythm issues.  If things remain stable then being off metoprolol would be reasonable.  In regards to his peripheral edema he has no significant valvular disease such as tricuspid regurgitation.  I believe this is all third spacing.  I encouraged him to participate in PT to help mobilize this fluid.    Certainly if it does not respond to this a few days worth of Lasix could be prescribed.  I will see him back in 1 to 2 months or earlier if needed.           Dispo:  No follow-ups on file.      Medication Adjustments/Labs and Tests Ordered: Current medicines are reviewed at length with the patient today.  Concerns regarding medicines are outlined above.    Tests Ordered: No orders of the defined types were placed in this encounter.    Medication Changes: No orders of the defined types were placed in this encounter.    History of Present Illness:     The patient is a 70 y.o. male with the indicated  medical history here for recommendations regarding supraventricular tachycardia.  The patient had been admitted to the hospital after postoperative wound infection, and L5-S1 spinal surgery.  He was admitted twice for treatment of this.  He was noted to be in a supraventricular tachycardia as well as sinus rhythm with PVCs.  He had an echocardiogram which I reviewed which demonstrated normal function with no significant valvular abnormalities.  He also had a TEE which showed no vegetations.  We started metoprolol.  He was seen by his primary care provider and was doing relatively well.  He did demonstrate increasing peripheral edema.  In terms of other symptoms the patient denies any palpitations, paroxysmal nocturnal dyspnea, orthopnea, signs or symptoms of stroke, or severe bleeding.  He is still rather deconditioned and does get moderately short of breath with exertion.  He is required no emergency room visits or hospitalizations.  He is just finished his antibiotics.  He has had no other infectious disease issues per se       Previous Medical History: Past Medical History:  Diagnosis Date   Arthritis    Chronic back pain    scoliosis/stenosis/spondylosis   History of bronchitis    many yrs ago   History of shingles    Hyperlipidemia    takes Lipitor daily   Hypertension    takes Lotrel daily   Lower extremity edema 10/22/2021     Current Medications:  No outpatient medications have been marked as taking for the 11/10/21 encounter (Appointment) with [name], MD.     Allergies:    Patient has no known allergies.   Social History:   Social History   Tobacco Use   Smoking status: Never   Smokeless tobacco: Never  Vaping Use   Vaping Use: Never used  Substance Use Topics   Alcohol use: No   Drug use: No     Family Hx: Family History  Problem Relation Age of Onset   Heart disease Father        before age 14   Hyperlipidemia Father    Hypertension Father     Hypertension Brother    Heart disease Brother        before age 61   Cancer Daughter      Review of Systems:   Please see the history of present illness.    All other systems reviewed and are negative.  EKGs/Labs/Other Test Reviewed:    EKG:  EKG today demonstrates sinus rhythm with occasional PVCs; prior EKGs demonstrate sinus rhythm with PACs  Prior CV studies:  TEE 10/22   1. No vegetations.   2. Left ventricular ejection fraction, by estimation, is 55 to 60%. The  left ventricle has normal function.   3. Right ventricular systolic function is normal. The right ventricular  size is normal.   4. No left atrial/left atrial appendage thrombus was detected.   5. The mitral valve is normal in structure. Trivial mitral valve  regurgitation.   6. The aortic valve is tricuspid. Aortic valve regurgitation is not  visualized.   7. Aortic dilatation noted. There is borderline dilatation of the aortic  root, measuring 38 mm.    ECHO COMPLETE WO IMAGING ENHANCING AGENT 09/09/2021  Narrative ECHOCARDIOGRAM REPORT    Patient Name:   Michael Hill Date of Exam: 09/09/2021 Medical Rec #:  [id number]       Height:       67.0 in Accession #:    [id number]      Weight:       215.2 lb Date of Birth:  Jun 01, 1951      BSA:          2.086 m Patient Age:    69 years        BP:           120/69 mmHg Patient Gender: M               HR:           101 bpm. Exam Location:  Inpatient  Procedure: 2D Echo, Cardiac Doppler and Color Doppler  Indications:    CHF Acute Diastolic I 50.31  History:        Patient has no prior history of Echocardiogram examinations. Arrythmias:Tachycardia. Sepsis. Deveoped infection post back surgery. Covid 19 positive. Edema. d Sonographer:    Roosvelt Maser RDCS Referring Phys: [id number] RONDELL A SMITH  IMPRESSIONS   1. Left ventricular ejection fraction, by estimation, is 55 to 60%. The left ventricle has normal function. The left ventricle has no regional  wall motion abnormalities. Left ventricular diastolic function could not be evaluated. 2. Right ventricular systolic function was not well visualized. The right ventricular size is not well visualized. There is normal pulmonary artery systolic pressure. 3. Left atrial size was mildly dilated. 4. The mitral valve is normal in structure. Trivial mitral valve regurgitation. 5. The aortic valve is normal in structure.  Aortic valve regurgitation is not visualized.  FINDINGS Left Ventricle: Left ventricular ejection fraction, by estimation, is 55 to 60%. The left ventricle has normal function. The left ventricle has no regional wall motion abnormalities. The left ventricular internal cavity size was normal in size. There is no left ventricular hypertrophy. Left ventricular diastolic function could not be evaluated.  Right Ventricle: The right ventricular size is not well visualized. Right vetricular wall thickness was not well visualized. Right ventricular systolic function was not well visualized. There is normal pulmonary artery systolic pressure. The tricuspid regurgitant velocity is 2.53 m/s, and with an assumed right atrial pressure of 3 mmHg, the estimated right ventricular systolic pressure is 28.6 mmHg.  Left Atrium: Left atrial size was mildly dilated.  Right Atrium: Right atrial size was not well visualized.  Pericardium: There is no evidence of pericardial effusion.  Mitral Valve: The mitral valve is normal in structure. Trivial mitral valve regurgitation.  Tricuspid Valve: The tricuspid valve is normal in structure. Tricuspid valve regurgitation is not demonstrated.  Aortic Valve: The aortic valve is normal in structure. Aortic valve regurgitation is not visualized.  Pulmonic Valve: The pulmonic valve was normal in structure. Pulmonic valve regurgitation is trivial.  Aorta: The aortic root is normal in size and structure.  IAS/Shunts: The atrial septum is grossly normal.   LEFT  VENTRICLE PLAX 2D LVIDd:         5.30 cm LVIDs:         3.30 cm LV PW:         1.30 cm LV IVS:        1.10 cm   IVC IVC diam: 1.30 cm  LEFT ATRIUM             Index LA diam:        4.00 cm 1.92 cm/m LA Vol (A2C):   61.3 ml 29.39 ml/m LA Vol (A4C):   84.3 ml 40.41 ml/m LA Biplane Vol: 73.6 ml 35.28 ml/m  AORTA Ao Root diam: 3.90 cm  TRICUSPID VALVE TR Peak grad:   25.6 mmHg TR Vmax:        253.00 cm/s  Kristeen Miss MD Electronically signed by Kristeen Miss MD Signature Date/Time: 09/09/2021/2:25:35 PM    Final     Imaging studies that I have independently reviewed today: TTE  Recent Labs: 09/08/2021: B Natriuretic Peptide 503.3 09/22/2021: ALT 7 09/27/2021: TSH 1.914 09/28/2021: BUN 5; Creatinine, Ser 0.70; Magnesium 1.8; Potassium 3.6; Sodium 135 09/30/2021: Hemoglobin 8.7; Platelets 504   Recent Lipid Panel No results found for: CHOL, TRIG, HDL, LDLCALC, LDLDIRECT   Risk Assessment/Calculations:           Physical Exam:    VS:  There were no vitals taken for this visit.   Wt Readings from Last 3 Encounters:  10/22/21 208 lb (94.3 kg)  09/24/21 199 lb 15.3 oz (90.7 kg)  09/08/21 215 lb 2.7 oz (97.6 kg)    GENERAL:  No apparent distress, AOx3 HEENT:  No carotid bruits, +2 carotid impulses, no scleral icterus CAR: RRR, no murmurs, gallops, rubs, or thrills RES:  Clear to auscultation bilaterally ABD:  Soft, nontender, nondistended, positive bowel sounds x 4 VASC:  +2 radial pulses, +2 carotid pulses, palpable pedal pulses NEURO:  CN 2-12 grossly intact; motor and sensory grossly intact PSYCH:  No active depression or anxiety EXT:  +3 edema, ecchymosis, or cyanosis    Signed, [name], MD  11/08/2021 12:31 PM    [facility] [place], [place], [place]  [id number] Phone: [phone]; Fax: [phone]

## 2021-11-10 ENCOUNTER — Encounter: Payer: Self-pay | Admitting: Internal Medicine

## 2021-11-10 ENCOUNTER — Ambulatory Visit: Payer: Medicare PPO | Admitting: Internal Medicine

## 2021-11-10 ENCOUNTER — Other Ambulatory Visit: Payer: Self-pay

## 2021-11-10 ENCOUNTER — Ambulatory Visit (INDEPENDENT_AMBULATORY_CARE_PROVIDER_SITE_OTHER): Payer: Medicare PPO

## 2021-11-10 VITALS — BP 124/68 | HR 71 | Ht 67.0 in | Wt 209.0 lb

## 2021-11-10 DIAGNOSIS — I471 Supraventricular tachycardia: Secondary | ICD-10-CM

## 2021-11-10 NOTE — Patient Instructions (Signed)
Medication Instructions:  Stop Metoprolol   *If you need a refill on your cardiac medications before your next appointment, please call your pharmacy*   Lab Work: None ordered   If you have labs (blood work) drawn today and your tests are completely normal, you will receive your results only by: MyChart Message (if you have MyChart) OR A paper copy in the mail If you have any lab test that is abnormal or we need to change your treatment, we will call you to review the results.   Testing/Procedures: Your physician has requested that you wear a zio monitor for 7 days. Your monitor will arrive in the mail but do not apply it until Monday.      Follow-Up: Follow up as scheduled    Other Instructions ZIO XT- Long Term Monitor Instructions  Your physician has requested you wear a ZIO patch monitor for 7 days.  This is a single patch monitor. [employer] supplies one patch monitor per enrollment. Additional stickers are not available. Please do not apply patch if you will be having a Nuclear Stress Test,  Echocardiogram, Cardiac CT, MRI, or Chest Xray during the period you would be wearing the  monitor. The patch cannot be worn during these tests. You cannot remove and re-apply the  ZIO XT patch monitor.  Your ZIO patch monitor will be mailed 3 day USPS to your address on file. It may take 3-5 days  to receive your monitor after you have been enrolled.  Once you have received your monitor, please review the enclosed instructions. Your monitor  has already been registered assigning a specific monitor serial # to you.  Billing and Patient Assistance Program Information  We have supplied [employer] with any of your insurance information on file for billing purposes. [employer] offers a sliding scale Patient Assistance Program for patients that do not have  insurance, or whose insurance does not completely cover the cost of the ZIO monitor.  You must apply for the Patient Assistance Program to  qualify for this discounted rate.  To apply, please call [employer] at [phone], select option 4, select option 2, ask to apply for  Patient Assistance Program. Meredeth Ide will ask your household income, and how many people  are in your household. They will quote your out-of-pocket cost based on that information.  [employer] will also be able to set up a 50-month, interest-free payment plan if needed.  Applying the monitor   Shave hair from upper left chest.  Hold abrader disc by orange tab. Rub abrader in 40 strokes over the upper left chest as  indicated in your monitor instructions.  Clean area with 4 enclosed alcohol pads. Let dry.  Apply patch as indicated in monitor instructions. Patch will be placed under collarbone on left  side of chest with arrow pointing upward.  Rub patch adhesive wings for 2 minutes. Remove white label marked "1". Remove the white  label marked "2". Rub patch adhesive wings for 2 additional minutes.  While looking in a mirror, press and release button in center of patch. A small green light will  flash 3-4 times. This will be your only indicator that the monitor has been turned on.  Do not shower for the first 24 hours. You may shower after the first 24 hours.  Press the button if you feel a symptom. You will hear a small click. Record Date, Time and  Symptom in the Patient Logbook.  When you are ready to remove the patch, follow  instructions on the last 2 pages of Patient  Logbook. Stick patch monitor onto the last page of Patient Logbook.  Place Patient Logbook in the blue and white box. Use locking tab on box and tape box closed  securely. The blue and white box has prepaid postage on it. Please place it in the mailbox as  soon as possible. Your physician should have your test results approximately 7 days after the  monitor has been mailed back to [facility].  Call [place] at [phone] if you have questions regarding  your ZIO XT  patch monitor. Call them immediately if you see an orange light blinking on your  monitor.  If your monitor falls off in less than 4 days, contact our Monitor department at [phone].  If your monitor becomes loose or falls off after 4 days call [employer] at [phone] for  suggestions on securing your monitor

## 2021-11-10 NOTE — Progress Notes (Unsigned)
Enrolled patient for a 7 day Zio XT monitor to be mailed to patients home.  

## 2021-11-11 ENCOUNTER — Telehealth: Payer: Self-pay

## 2021-11-11 NOTE — Telephone Encounter (Signed)
Patient's wife called, says he is experiencing pain down his leg, foot, and lower back and is having trouble getting around. Denies fever and chills. They are concerned that infection is worsening/recurring after stopping IV antibiotics and switching to orals. Patient's wife would like to know if they need to come back in for additional lab work. Will route to provider.   Sandie Ano, RN

## 2021-11-11 NOTE — Telephone Encounter (Signed)
Patient accepts appointment for 3 pm 12/9.  Sandie Ano, RN

## 2021-11-12 ENCOUNTER — Ambulatory Visit: Payer: Medicare PPO | Admitting: Infectious Disease

## 2021-11-12 ENCOUNTER — Telehealth: Payer: Self-pay

## 2021-11-12 NOTE — Telephone Encounter (Signed)
Wife called stated patient is feeling better and they would like to cancel today's appointment.  Next appointment with [facility] confirmed for 01/05/22

## 2021-11-15 ENCOUNTER — Ambulatory Visit: Payer: Medicare PPO | Admitting: Internal Medicine

## 2021-11-16 DIAGNOSIS — I471 Supraventricular tachycardia: Secondary | ICD-10-CM | POA: Diagnosis not present

## 2021-11-22 ENCOUNTER — Telehealth: Payer: Self-pay

## 2021-11-22 NOTE — Telephone Encounter (Signed)
Patient's wife Vernona Rieger called complaining of the patient having a lot of loose stools with a lot of gas x 1 week. Patient is currently on oral cefadroxil. Patient is calling to see if this is related to the cefadroxil.  Stavroula Rohde T Pricilla Loveless

## 2021-11-22 NOTE — Telephone Encounter (Signed)
Patient's wife informed to start imodium and she verbalized understanding. Patient wife also asking if patient should start taking a probiotic and if so do you have any recommendations Gotham Raden T Pricilla Loveless

## 2021-11-22 NOTE — Telephone Encounter (Signed)
It could be related but it is hard to tell. Please ask him to take OTC imodium for some relief. If not better within a few days, please call back.

## 2021-11-23 NOTE — Telephone Encounter (Signed)
Patient wife advised a probiotic is safe to take if he would like

## 2021-11-23 NOTE — Telephone Encounter (Signed)
I am a firm believer in probiotics, so I would say no.

## 2021-11-23 NOTE — Telephone Encounter (Signed)
Patient wanted to be sure it is safe to take if they decide to do so.

## 2021-11-23 NOTE — Telephone Encounter (Signed)
I am not a firm believer** sorry!

## 2021-11-23 NOTE — Telephone Encounter (Signed)
No issues. He can take one if he wants to.

## 2021-11-24 ENCOUNTER — Telehealth: Payer: Self-pay | Admitting: Pharmacist

## 2021-11-24 NOTE — Telephone Encounter (Signed)
Patient's wife called again reviewing patient's ongoing abdominal side effects from cefadroxil over the last week. Symptoms include cramping, gas, and diarrhea. He has also noticed a reduced appetite this week. Patient did have possibly expired salad dressing 1 week ago, but otherwise no indications of infection or other contributing factors.  Discussed some options including dose reducing cefadroxil to 500mg  BID rather than current 1000mg  BID. Can also recommend simethicone, pepto bismol, or Lomotil for symptomatic relief.  Dr. , would you be willing to try a reduced dose of cefadroxil 500mg  BID for suppression? Will update prescription if so and return call to patient.  Thank you!  , PharmD, CPP Clinical Pharmacist Practitioner Infectious Diseases Clinical Pharmacist [facility]

## 2021-11-25 ENCOUNTER — Other Ambulatory Visit: Payer: Self-pay | Admitting: Pharmacist

## 2021-11-25 DIAGNOSIS — U071 COVID-19: Secondary | ICD-10-CM

## 2021-11-25 DIAGNOSIS — T847XXD Infection and inflammatory reaction due to other internal orthopedic prosthetic devices, implants and grafts, subsequent encounter: Secondary | ICD-10-CM

## 2021-11-25 MED ORDER — CEFADROXIL 500 MG PO CAPS: 500.0000 mg | ORAL_CAPSULE | Freq: Two times a day (BID) | ORAL | 11 refills | Status: DC

## 2021-11-25 NOTE — Telephone Encounter (Signed)
Agreed - sounds good and thanks again!

## 2021-11-25 NOTE — Telephone Encounter (Signed)
Reviewed plan to reduce cefadroxil dose to 1 tablet BID with Mrs. Barich. Discussed that we have other options if he continues to have symptoms. She asked if he needs to have labs drawn sooner than February with the dose reduction. Let me know - thanks! Marchelle Folks

## 2021-11-30 ENCOUNTER — Telehealth: Payer: Self-pay | Admitting: Internal Medicine

## 2021-11-30 NOTE — Telephone Encounter (Signed)
Abnormal ziopatch results

## 2021-11-30 NOTE — Telephone Encounter (Signed)
Nicholette with [employer] called to report pt had an episode of rapid Aflutter 188 bpm for 60 sec on 11/22/21 at 7:46.  Will route to MD to review.

## 2021-12-02 DIAGNOSIS — R197 Diarrhea, unspecified: Secondary | ICD-10-CM | POA: Diagnosis not present

## 2021-12-02 MED ORDER — DILTIAZEM HCL ER COATED BEADS 180 MG PO CP24: 180.0000 mg | ORAL_CAPSULE | Freq: Every day | ORAL | 3 refills | Status: DC

## 2021-12-02 MED ORDER — APIXABAN 5 MG PO TABS: 5.0000 mg | ORAL_TABLET | Freq: Two times a day (BID) | ORAL | 11 refills | Status: DC

## 2021-12-02 NOTE — Telephone Encounter (Signed)
[name], MD  12/02/2021  1:49 PM EST Back to Top    Let him know a rhythm abnormality called atrial flutter was detected.  I would like him to start Eliquis 5 bid and diltiazem 180 at night.  We will discuss at next appt as well.     Spoke with pt and reviewed monitor results.  Pt hesitant to start medications at first.  Explained to him the importance of these two medications and went into more detail about the episodes of arrhythmias he was having on his monitor (I.e. runs of SVT and quantity, burden of AF) and pt was more willing to try the medications.  Offered samples of Eliquis but pt preferred to pick up from pharmacy.  Advised I will send those over and reviewed issues to watch for such as bleeding or increased fatigue.  Pt verbalized understanding and was in agreement with plan.

## 2021-12-03 NOTE — Telephone Encounter (Signed)
Patient calling back to discuss the lipitor and diltiazem. He says he would like to know if there are any interactions and side effects. He says he looked online and some web sites mention no interaction, while others say there may be.

## 2021-12-03 NOTE — Telephone Encounter (Signed)
Spoke with pt and discussed Diltiazem and statin usage.  Explained that we have more issues with Simvastatin.  Discussed that he is on lower doses of both medications, so unlikely to have issues related to using these medications together.  Pt verbalized understanding and was appreciative for call.

## 2021-12-07 ENCOUNTER — Telehealth: Payer: Self-pay

## 2021-12-07 NOTE — Progress Notes (Deleted)
Cardiology Office Note:    Date:  12/07/2021   ID:  Michael Hill, DOB [birth year], MRN [id number]  PCP:  Tally Joe, MD   [facility] Providers Cardiologist:  Alverda Skeans, MD Referring MD: Tally Joe, MD   Chief Complaint/Reason for Referral: 1 month follow-up  ASSESSMENT:    Atrial flutter, unspecified type [facility])  SVT (supraventricular tachycardia) (HCC)    PLAN:    In order of problems listed above:            {Are you ordering a CV Procedure (e.g. stress test, cath, DCCV, TEE, etc)?   Press F2        :[id number]}   Dispo:  No follow-ups on file.     Medication Adjustments/Labs and Tests Ordered: Current medicines are reviewed at length with the patient today.  Concerns regarding medicines are outlined above.   Tests Ordered: No orders of the defined types were placed in this encounter.   Medication Changes: No orders of the defined types were placed in this encounter.   History of Present Illness:    FOCUSED CARDIOVASCULAR PROBLEM LIST: 1.  Supraventricular tachycardia during hospitalization for wound infections; Holter monitor thereafter demonstrated SVT and atrial flutter   The patient is a 71 y.o. male with the indicated medical history here for 1 month follow-up.  When I saw him last he was doing relatively well.  She was in normal sinus rhythm.  I stopped his beta-blocker as I wanted to assess his rhythm with him now discharged following hospitalization for deconditioning, wound infection, and pain.  This monitor demonstrated atrial flutter and supraventricular tachycardia.  He was started on diltiazem 180 mg and Eliquis 5 mg twice daily.     Previous Medical History: Past Medical History:  Diagnosis Date   Arthritis    Chronic back pain    scoliosis/stenosis/spondylosis   Herpes zoster    History of bronchitis    many yrs ago   History of elevated PSA    History of shingles    Hypercholesterolemia    Hyperlipidemia    takes  Lipitor daily   Hypertension    takes Lotrel daily   Lower extremity edema 10/22/2021   Prediabetes    Severe obesity (HCC)      Current Medications: No outpatient medications have been marked as taking for the 12/10/21 encounter (Appointment) with [name], MD.     Allergies:    Patient has no known allergies.   Social History:   Social History   Tobacco Use   Smoking status: Never   Smokeless tobacco: Never  Vaping Use   Vaping Use: Never used  Substance Use Topics   Alcohol use: No   Drug use: No     Family Hx: Family History  Problem Relation Age of Onset   Heart disease Father        before age 85   Hyperlipidemia Father    Hypertension Father    Hypertension Brother    Heart disease Brother        before age 38   Cancer Daughter      Review of Systems:   Please see the history of present illness.    All other systems reviewed and are negative.  EKGs/Labs/Other Test Reviewed:    EKG:  EKG today:***; prior EKG: ***  Prior CV studies: *** {Select studies to display:26339}   Imaging studies that I have independently reviewed today: ***  Recent Labs: 09/08/2021: B Natriuretic Peptide 503.3  09/22/2021: ALT 7 09/27/2021: TSH 1.914 09/28/2021: BUN 5; Creatinine, Ser 0.70; Magnesium 1.8; Potassium 3.6; Sodium 135 09/30/2021: Hemoglobin 8.7; Platelets 504   Recent Lipid Panel No results found for: CHOL, TRIG, HDL, LDLCALC, LDLDIRECT  Risk Assessment/Calculations:    {Does this patient have ATRIAL FIBRILLATION?[phone]}      Physical Exam:    VS:  There were no vitals taken for this visit.   Wt Readings from Last 3 Encounters:  11/10/21 209 lb (94.8 kg)  10/22/21 208 lb (94.3 kg)  09/24/21 199 lb 15.3 oz (90.7 kg)    GENERAL:  No apparent distress, AOx3 HEENT:  No carotid bruits, +2 carotid impulses, no scleral icterus CAR: RRR Irregular RR*** no murmurs***, gallops, rubs, or thrills RES:  Clear to auscultation bilaterally ABD:   Soft, nontender, nondistended, positive bowel sounds x 4 VASC:  +2 radial pulses, +2 carotid pulses, palpable pedal pulses NEURO:  CN 2-12 grossly intact; motor and sensory grossly intact PSYCH:  No active depression or anxiety EXT:  No edema, ecchymosis, or cyanosis  Signed, Early Osmond, MD  12/07/2021 12:26 PM    [address], [place], [place]  [address] Phone: [phone]; Fax: [phone]   Note:  This document was prepared using Dragon voice recognition software and may include unintentional dictation errors.

## 2021-12-07 NOTE — Telephone Encounter (Signed)
The patient reports his elbow is "flaring up" and requests to change office visit with Dr. Lynnette Caffey on 12/10/2021 to virtual visit.   Reviewed virtual visit instructions and consent obtained. He was grateful for call and agrees with plan.     Patient Consent for Virtual Visit        Michael Hill has provided verbal consent on 12/07/2021 for a virtual visit (video or telephone).   CONSENT FOR VIRTUAL VISIT FOR:  Michael Hill  By participating in this virtual visit I agree to the following:  I hereby voluntarily request, consent and authorize [facility] and its employed or contracted physicians, physician assistants, nurse practitioners or other licensed health care professionals (the Practitioner), to provide me with telemedicine health care services (the Services") as deemed necessary by the treating Practitioner. I acknowledge and consent to receive the Services by the Practitioner via telemedicine. I understand that the telemedicine visit will involve communicating with the Practitioner through live audiovisual communication technology and the disclosure of certain medical information by electronic transmission. I acknowledge that I have been given the opportunity to request an in-person assessment or other available alternative prior to the telemedicine visit and am voluntarily participating in the telemedicine visit.  I understand that I have the right to withhold or withdraw my consent to the use of telemedicine in the course of my care at any time, without affecting my right to future care or treatment, and that the Practitioner or I may terminate the telemedicine visit at any time. I understand that I have the right to inspect all information obtained and/or recorded in the course of the telemedicine visit and may receive copies of available information for a reasonable fee.  I understand that some of the potential risks of receiving the Services via telemedicine include:  Delay or  interruption in medical evaluation due to technological equipment failure or disruption; Information transmitted may not be sufficient (e.g. poor resolution of images) to allow for appropriate medical decision making by the Practitioner; and/or  In rare instances, security protocols could fail, causing a breach of personal health information.  Furthermore, I acknowledge that it is my responsibility to provide information about my medical history, conditions and care that is complete and accurate to the best of my ability. I acknowledge that Practitioner's advice, recommendations, and/or decision may be based on factors not within their control, such as incomplete or inaccurate data provided by me or distortions of diagnostic images or specimens that may result from electronic transmissions. I understand that the practice of medicine is not an exact science and that Practitioner makes no warranties or guarantees regarding treatment outcomes. I acknowledge that a copy of this consent can be made available to me via my patient portal [facility] MyChart), or I can request a printed copy by calling the office of [facility].    I understand that my insurance will be billed for this visit.   I have read or had this consent read to me. I understand the contents of this consent, which adequately explains the benefits and risks of the Services being provided via telemedicine.  I have been provided ample opportunity to ask questions regarding this consent and the Services and have had my questions answered to my satisfaction. I give my informed consent for the services to be provided through the use of telemedicine in my medical care

## 2021-12-09 NOTE — Progress Notes (Signed)
Virtual Visit via Telephone Note   This visit type was conducted due to national recommendations for restrictions regarding the COVID-19 Pandemic (e.g. social distancing) in an effort to limit this patient's exposure and mitigate transmission in our community.  Due to his co-morbid illnesses, this patient is at least at moderate risk for complications without adequate follow up.  This format is felt to be most appropriate for this patient at this time.  The patient did not have access to video technology/had technical difficulties with video requiring transitioning to audio format only (telephone).  All issues noted in this document were discussed and addressed.  No physical exam could be performed with this format.  Please refer to the patient's chart for his  consent to telehealth for [facility].    Date:  12/10/2021   ID:  Michael Hill, DOB Dec 17, 1950, MRN [id number] The patient was identified using 2 identifiers.  Patient Location: Home Provider Location: Office/Clinic   PCP:  Tally Joe, MD   [facility] Providers Cardiologist:  None     Evaluation Performed:  Follow-Up Visit  Chief Complaint:  SVT/atrial flutter  History of Present Illness:    FOCUSED CARDIOVASCULAR PROBLEM LIST: 1.  Supraventricular tachycardia seen during complicated hospitalization for sepsis due to surgical site wound infection; monitor December 2022 thereafter which demonstrated atrial flutter and supraventricular tachycardia   The patient requested telephone or video visit due to the fact he is developed some right elbow soreness and wanted to recuperate at home.  When I saw him last I referred him for a monitor which demonstrated atrial flutter and supraventricular tachycardia.  He was advised to start diltiazem and Eliquis.  He is doing very well.  He denies any chest pain, shortness of breath, palpitations, paroxysmal nocturnal dyspnea, orthopnea.  He is interested in starting more physical  therapy and would like to become more active.  He has no complaints and is required no emergency room visits or hospitalizations.  The patient does not have symptoms concerning for COVID-19 infection (fever, chills, cough, or new shortness of breath).    Past Medical History:  Diagnosis Date   Arthritis    Chronic back pain    scoliosis/stenosis/spondylosis   Herpes zoster    History of bronchitis    many yrs ago   History of elevated PSA    History of shingles    Hypercholesterolemia    Hyperlipidemia    takes Lipitor daily   Hypertension    takes Lotrel daily   Lower extremity edema 10/22/2021   Prediabetes    Severe obesity (HCC)    Past Surgical History:  Procedure Laterality Date   ABDOMINAL EXPOSURE N/A 08/05/2014   Procedure: ABDOMINAL EXPOSURE;  Surgeon: Chuck Hint, MD;  Location: [facility];  Service: Vascular;  Laterality: N/A;   ANTERIOR LAT LUMBAR FUSION Right 08/05/2014   Procedure: Right Lumbar two-three,Lumbar three-four,Lumbar four-five Anterior lateral lumbar interbody fusion;  Surgeon: Maeola Harman, MD;  Location: [facility];  Service: Neurosurgery;  Laterality: Right;  right    ANTERIOR LUMBAR FUSION N/A 08/05/2014   Procedure: Lumbar five-Sacral-One Anterior lumbar interbody fusion with Dr. Edilia Bo for approach; Right Lumbar two-three,Lumbar three-four,Lumbar four-five Anterior lateral lumbar interbody fusion;  Surgeon: Maeola Harman, MD;  Location: [facility];  Service: Neurosurgery;  Laterality: N/A;   APPLICATION OF INTRAOPERATIVE CT SCAN N/A 08/24/2021   Procedure: APPLICATION OF INTRAOPERATIVE CT SCAN;  Surgeon: Maeola Harman, MD;  Location[facility];  Service: Neurosurgery;  Laterality: N/A;   APPLICATION OF WOUND VAC N/A 09/24/2021   Procedure: APPLICATION OF WOUND VAC;  Surgeon: Dawley, Theodoro Doing, DO;  Location: [place];  Service: Neurosurgery;  Laterality: N/A;   COLONOSCOPY     EPIDURAL BLOCK INJECTION     KNEE ARTHROSCOPY Bilateral    x 4     LUMBAR FUSION  08/05/2014        DR STERN   LUMBAR PERCUTANEOUS PEDICLE SCREW 3 LEVEL N/A 08/07/2014   Procedure: Stage II Lumbar percutaneous pedicle screw placement L2-S1;  Surgeon: Erline Levine, MD;  Location: [place] NEURO ORS;  Service: Neurosurgery;  Laterality: N/A;  Stage II Lumbar percutaneous pedicle screw placement L2-S1   LUMBAR WOUND DEBRIDEMENT N/A 09/08/2021   Procedure: Incision and drainage of lumbar wound;  Surgeon: Erline Levine, MD;  Location: [place];  Service: Neurosurgery;  Laterality: N/A;   PROSTATE BIOPSY     ROTATOR CUFF REPAIR Right    TEE WITHOUT CARDIOVERSION N/A 09/14/2021   Procedure: TRANSESOPHAGEAL ECHOCARDIOGRAM (TEE);  Surgeon: Lelon Perla, MD;  Location: [place];  Service: Cardiovascular;  Laterality: N/A;   WOUND EXPLORATION N/A 09/24/2021   Procedure: Lumbar Wound Irrigation and Debridement.;  Surgeon: Karsten Ro, DO;  Location: [place];  Service: Neurosurgery;  Laterality: N/A;  Wound appplied     Current Meds  Medication Sig   acetaminophen (TYLENOL) 325 MG tablet Take 325 mg by mouth every 6 (six) hours as needed for moderate pain, headache or mild pain.   apixaban (ELIQUIS) 5 MG TABS tablet Take 1 tablet (5 mg total) by mouth 2 (two) times daily.   atorvastatin (LIPITOR) 20 MG tablet Take 20 mg by mouth daily.   cefadroxil (DURICEF) 500 MG capsule Take 1 capsule (500 mg total) by mouth 2 (two) times daily.   diltiazem (CARDIZEM CD) 180 MG 24 hr capsule Take 1 capsule (180 mg total) by mouth at bedtime.   oxyCODONE (OXY IR/ROXICODONE) 5 MG immediate release tablet Take 1-2 tablets (5-10 mg total) by mouth every 3 (three) hours as needed for moderate pain.   oxyCODONE (OXYCONTIN) 10 mg 12 hr tablet Take 10 mg by mouth every 12 (twelve) hours.     Allergies:   Patient has no known allergies.   Social History   Tobacco Use   Smoking status: Never   Smokeless tobacco: Never  Vaping Use   Vaping Use: Never used  Substance Use Topics   Alcohol  use: No   Drug use: No     Family Hx: The patient's family history includes Cancer in his daughter; Heart disease in his brother and father; Hyperlipidemia in his father; Hypertension in his brother and father.  ROS:   Please see the history of present illness.     All other systems reviewed and are negative.   Prior CV studies:   The following studies were reviewed today:  Monitor  TTE 10/22  1. Left ventricular ejection fraction, by estimation, is 55 to 60%. The  left ventricle has normal function. The left ventricle has no regional  wall motion abnormalities. Left ventricular diastolic function could not  be evaluated.   2. Right ventricular systolic function was not well visualized. The right  ventricular size is not well visualized. There is normal pulmonary artery  systolic pressure.   3. Left atrial size was mildly dilated.   4. The mitral valve is normal in structure. Trivial mitral valve  regurgitation.   5. The aortic valve is normal in  structure. Aortic valve regurgitation is  not visualized.   Labs/Other Tests and Data Reviewed:    Monitor: 12/22 2 Ventricular Tachycardia runs occurred, the run with the fastest interval lasting 4 beats with a max rate of 197 bpm, the longest  lasting 4 beats with an avg rate of 153 bpm.    2720 Supraventricular Tachycardia runs occurred, the run with the fastest interval lasting 18 beats with a max rate of 255 bpm, the longest lasting 29.5 secs with an avg rate of 111 bpm.    Atrial Flutter occurred (6% burden), ranging from 67-218 bpm (avg of 112 bpm), the longest lasting 15 mins 12 secs with an avg rate of 155 bpm.    Isolated SVEs were frequent (8.7%, 70544), SVE Couplets were occasional (2.7%, 11073), and SVE Triplets were occasional (1.8%, 5001). Isolated VEs were occasional (1.1%, 8820), VE Couplets were rare (<1.0%, 323), and VE Triplets were rare (<1.0%, 11).  Ventricular Bigeminy and Trigeminy were present.    No  atrial fibrillation, ventricular tachyarrhythmias, or bradyarrhythmias were detected.  Atrial flutter was detected as specified above.  Recent Labs: 09/08/2021: B Natriuretic Peptide 503.3 09/22/2021: ALT 7 09/27/2021: TSH 1.914 09/28/2021: BUN 5; Creatinine, Ser 0.70; Magnesium 1.8; Potassium 3.6; Sodium 135 09/30/2021: Hemoglobin 8.7; Platelets 504   Recent Lipid Panel No results found for: CHOL, TRIG, HDL, CHOLHDL, LDLCALC, LDLDIRECT  Wt Readings from Last 3 Encounters:  12/10/21 195 lb (88.5 kg)  11/10/21 209 lb (94.8 kg)  10/22/21 208 lb (94.3 kg)     Risk Assessment/Calculations:          Objective:    Vital Signs:  BP 131/71    Pulse 73    Temp (!) 96.5 F (35.8 C)    Ht 5\' 7"  (1.702 m)    Wt 195 lb (88.5 kg)    BMI 30.54 kg/m      ASSESSMENT & PLAN:     SVT: Continue diltiazem.  The patient is feeling relatively well.  I will have him follow-up with me in 3 months.  2.  Atrial flutter: Continue Eliquis and diltiazem.  At our next visit we will discuss whether he would like to pursue an EP evaluation and recommendations regarding atrial flutter.             Time:   Today, I have spent 20 minutes with the patient with telehealth technology discussing the above problems.     Medication Adjustments/Labs and Tests Ordered: Current medicines are reviewed at length with the patient today.  Concerns regarding medicines are outlined above.   Tests Ordered: No orders of the defined types were placed in this encounter.   Medication Changes: No orders of the defined types were placed in this encounter.   Follow Up:  In Person in 3 month(s)  Signed, Early Osmond, MD  12/10/2021 10:42 AM    [place]

## 2021-12-10 ENCOUNTER — Other Ambulatory Visit: Payer: Self-pay

## 2021-12-10 ENCOUNTER — Ambulatory Visit: Payer: Medicare PPO | Admitting: Internal Medicine

## 2021-12-10 ENCOUNTER — Telehealth (INDEPENDENT_AMBULATORY_CARE_PROVIDER_SITE_OTHER): Payer: Medicare PPO | Admitting: Internal Medicine

## 2021-12-10 ENCOUNTER — Encounter: Payer: Self-pay | Admitting: Internal Medicine

## 2021-12-10 VITALS — BP 131/71 | HR 73 | Temp 96.5°F | Ht 67.0 in | Wt 195.0 lb

## 2021-12-10 DIAGNOSIS — I4892 Unspecified atrial flutter: Secondary | ICD-10-CM | POA: Diagnosis not present

## 2021-12-10 NOTE — Patient Instructions (Signed)
Medication Instructions:  No changes *If you need a refill on your cardiac medications before your next appointment, please call your pharmacy*   Lab Work: none If you have labs (blood work) drawn today and your tests are completely normal, you will receive your results only by: [place] (if you have MyChart) OR A paper copy in the mail If you have any lab test that is abnormal or we need to change your treatment, we will call you to review the results.   Testing/Procedures: none   Follow-Up: At [facility], you and your health needs are our priority.  As part of our continuing mission to provide you with exceptional heart care, we have created designated Provider Care Teams.  These Care Teams include your primary Cardiologist (physician) and Advanced Practice Providers (APPs -  Physician Assistants and Nurse Practitioners) who all work together to provide you with the care you need, when you need it.  We recommend signing up for the patient portal called "MyChart".  Sign up information is provided on this After Visit Summary.  MyChart is used to connect with patients for Virtual Visits (Telemedicine).  Patients are able to view lab/test results, encounter notes, upcoming appointments, etc.  Non-urgent messages can be sent to your provider as well.   To learn more about what you can do with MyChart, go to [email or web address].    Your next appointment:   3 month(s)  The format for your next appointment:   In Person  Provider:   Early Osmond, MD     Other Instructions

## 2022-01-04 NOTE — Progress Notes (Signed)
Subjective:  Chief complaint: Still having back pain though dramatically improved than when he had raging infection lower extremity edema improved    Patient ID: Michael Hill, male    DOB: 14-Mar-1951, 71 y.o.   MRN: [id number]  HPI  71 year old Caucasian man who had had final stenosis and lumbar radiculopathy status post L5-S1 decompression and fusion on September 21 then admitted October 5 with infection of his surgical site and frank bacteremia with MSSA and severe sepsis.  Blood cultures cleared he was ultimately placed on cefazolin and then rifampin was added.  As an outpatient he did not do very well on the rifampin with nausea malaise and poor appetite.  He then came back to the ER with worsening back pain and right lower extremity weakness and drainage from the wound.  MRI of the spine was performed which showed enhancing fluid collection in the soft tissues posterior lower lumbar spine was 11 x 1 x 3.3 cm in dimensions.  Patient was taken the operating room by Dr. Reatha Armour who found purulence extending down to hardware.  Patient was initially switched to nafcillin but then switch back to cefazolin.  We abandoned the idea of giving him any rifampin due to his poor tolerance of this is in the outpatient world.  He ultimately was discharged to home and is receiving cefazolin at home.  He has had worsening lower extremity edema while at home and is wonder if this was related to antibiotics the only way that I can think it is related the antibiotics is that he is getting salt saline infusions with the cefazolin dose during the day.  Back pain improved dramatically.  He finished IV cefazolin and switched over to cefadroxil.  Really cannot tolerate the higher dose of 1000 g twice a day so was changed to 500 twice a day with resolution of his loose stools.  He was diagnosed now with atrial flutter by cardiology who now have him on anticoagulation and diltiazem.  His lower  extremity edema has improved though still present.  He still struggling with back pain but he believes much of this may be due to to the fact that he is getting back a difficult therapy now versus having been able to do it after his surgery "     Past Medical History:  Diagnosis Date   Arthritis    Chronic back pain    scoliosis/stenosis/spondylosis   Herpes zoster    History of bronchitis    many yrs ago   History of elevated PSA    History of shingles    Hypercholesterolemia    Hyperlipidemia    takes Lipitor daily   Hypertension    takes Lotrel daily   Lower extremity edema 10/22/2021   Prediabetes    Severe obesity ([place])     Past Surgical History:  Procedure Laterality Date   ABDOMINAL EXPOSURE N/A 08/05/2014   Procedure: ABDOMINAL EXPOSURE;  Surgeon: Angelia Mould, MD;  Location: [place] NEUR[facility] OR[facility];  Service: Vascular;  Laterality: N/A;   ANTERIOR LAT LUMBAR FUSION Right 08/05/2014   Procedure: Right Lumbar two-three,Lumbar three-four,Lumbar four-five Anterior lateral lumbar interbody fusion;  Surgeon: Erline Levine, MD;  Location: [place] NEUR[facility] ORS;  Service: Neurosurgery;  Laterality: Right;  right    ANTERIOR LUMBAR FUSION N/A 08/05/2014   Procedure: Lumbar five-Sacral-One Anterior lumbar interbody fusion with Dr. Scot Dock for approach; Right Lumbar two-three,Lumbar three-four,Lumbar four-five Anterior lateral lumbar interbody fusion;  Surgeon: Erline Levine, MD;  Location: [place] NEUR[facility] ORS;  Service: Neurosurgery;  Laterality: N/A;   APPLICATION OF INTRAOPERATIVE CT SCAN N/A 08/24/2021   Procedure: APPLICATION OF INTRAOPERATIVE CT SCAN;  Surgeon: Erline Levine, MD;  Location: [place];  Service: Neurosurgery;  Laterality: N/A;   APPLICATION OF WOUND VAC N/A 09/24/2021   Procedure: APPLICATION OF WOUND VAC;  Surgeon: Dawley, Theodoro Doing, DO;  Location: [place];  Service: Neurosurgery;  Laterality: N/A;   COLONOSCOPY     EPIDURAL BLOCK INJECTION     KNEE ARTHROSCOPY Bilateral    x 4    LUMBAR  FUSION  08/05/2014        DR STERN   LUMBAR PERCUTANEOUS PEDICLE SCREW 3 LEVEL N/A 08/07/2014   Procedure: Stage II Lumbar percutaneous pedicle screw placement L2-S1;  Surgeon: Erline Levine, MD;  Location: [place] NEURO ORS;  Service: Neurosurgery;  Laterality: N/A;  Stage II Lumbar percutaneous pedicle screw placement L2-S1   LUMBAR WOUND DEBRIDEMENT N/A 09/08/2021   Procedure: Incision and drainage of lumbar wound;  Surgeon: Erline Levine, MD;  Location: [place];  Service: Neurosurgery;  Laterality: N/A;   PROSTATE BIOPSY     ROTATOR CUFF REPAIR Right    TEE WITHOUT CARDIOVERSION N/A 09/14/2021   Procedure: TRANSESOPHAGEAL ECHOCARDIOGRAM (TEE);  Surgeon: Lelon Perla, MD;  Location: [place];  Service: Cardiovascular;  Laterality: N/A;   WOUND EXPLORATION N/A 09/24/2021   Procedure: Lumbar Wound Irrigation and Debridement.;  Surgeon: Karsten Ro, DO;  Location: [place];  Service: Neurosurgery;  Laterality: N/A;  Wound appplied    Family History  Problem Relation Age of Onset   Heart disease Father        before age 54   Hyperlipidemia Father    Hypertension Father    Hypertension Brother    Heart disease Brother        before age 55   Cancer Daughter       Social History   Socioeconomic History   Marital status: Married    Spouse name: Not on file   Number of children: Not on file   Years of education: Not on file   Highest education level: Not on file  Occupational History   Not on file  Tobacco Use   Smoking status: Never   Smokeless tobacco: Never  Vaping Use   Vaping Use: Never used  Substance and Sexual Activity   Alcohol use: No   Drug use: No   Sexual activity: Not Currently  Other Topics Concern   Not on file  Social History Narrative   Not on file   Social Determinants of Health   Financial Resource Strain: Not on file  Food Insecurity: Not on file  Transportation Needs: Not on file  Physical Activity: Not on file  Stress: Not on file  Social  Connections: Not on file    No Known Allergies   Current Outpatient Medications:    acetaminophen (TYLENOL) 325 MG tablet, Take 325 mg by mouth every 6 (six) hours as needed for moderate pain, headache or mild pain., Disp: , Rfl:    apixaban (ELIQUIS) 5 MG TABS tablet, Take 1 tablet (5 mg total) by mouth 2 (two) times daily., Disp: 60 tablet, Rfl: 11   atorvastatin (LIPITOR) 20 MG tablet, Take 20 mg by mouth daily., Disp: , Rfl:    cefadroxil (DURICEF) 500 MG capsule, Take 1 capsule (500 mg total) by mouth 2 (two) times daily., Disp: 120 capsule, Rfl: 11   diltiazem (CARDIZEM CD) 180 MG 24 hr capsule, Take 1  capsule (180 mg total) by mouth at bedtime., Disp: 90 capsule, Rfl: 3   oxyCODONE (OXY IR/ROXICODONE) 5 MG immediate release tablet, Take 1-2 tablets (5-10 mg total) by mouth every 3 (three) hours as needed for moderate pain., Disp: 30 tablet, Rfl: 0   oxyCODONE (OXYCONTIN) 10 mg 12 hr tablet, Take 10 mg by mouth every 12 (twelve) hours., Disp: , Rfl:    Review of Systems  Constitutional:  Negative for chills and fever.  HENT:  Negative for congestion and sore throat.   Eyes:  Negative for photophobia.  Respiratory:  Negative for cough, shortness of breath and wheezing.   Cardiovascular:  Positive for leg swelling. Negative for chest pain and palpitations.  Gastrointestinal:  Negative for abdominal pain, blood in stool, constipation, diarrhea, nausea and vomiting.  Genitourinary:  Negative for dysuria, flank pain and hematuria.  Musculoskeletal:  Positive for back pain. Negative for myalgias.  Skin:  Negative for rash.  Neurological:  Negative for dizziness, weakness and headaches.  Hematological:  Does not bruise/bleed easily.  Psychiatric/Behavioral:  Negative for suicidal ideas.       Objective:   Physical Exam Constitutional:      General: He is not in acute distress.    Appearance: Normal appearance. He is well-developed. He is not ill-appearing or diaphoretic.  HENT:      Head: Normocephalic and atraumatic.     Right Ear: Hearing and external ear normal.     Left Ear: Hearing and external ear normal.     Nose: No nasal deformity or rhinorrhea.  Eyes:     General: No scleral icterus.    Conjunctiva/sclera: Conjunctivae normal.     Right eye: Right conjunctiva is not injected.     Left eye: Left conjunctiva is not injected.     Pupils: Pupils are equal, round, and reactive to light.  Neck:     Vascular: No JVD.  Cardiovascular:     Rate and Rhythm: Normal rate and regular rhythm.     Heart sounds: S1 normal and S2 normal.  Pulmonary:     Effort: Pulmonary effort is normal. No respiratory distress.  Abdominal:     General: There is no distension.     Palpations: Abdomen is soft.  Musculoskeletal:        General: Normal range of motion.     Right shoulder: Normal.     Left shoulder: Normal.     Cervical back: Normal range of motion and neck supple.     Right hip: Normal.     Left hip: Normal.     Right knee: Normal.     Left knee: Normal.  Lymphadenopathy:     Head:     Right side of head: No submandibular, preauricular or posterior auricular adenopathy.     Left side of head: No submandibular, preauricular or posterior auricular adenopathy.     Cervical: No cervical adenopathy.     Right cervical: No superficial or deep cervical adenopathy.    Left cervical: No superficial or deep cervical adenopathy.  Skin:    General: Skin is warm and dry.     Coloration: Skin is not pale.     Findings: No abrasion, bruising, ecchymosis, erythema, lesion or rash.     Nails: There is no clubbing.  Neurological:     General: No focal deficit present.     Mental Status: He is alert and oriented to person, place, and time.     Sensory: No sensory deficit.  Coordination: Coordination normal.     Gait: Gait normal.  Psychiatric:        Attention and Perception: He is attentive.        Mood and Affect: Mood normal.        Speech: Speech normal.         Behavior: Behavior normal. Behavior is cooperative.        Thought Content: Thought content normal.        Judgment: Judgment normal.          Assessment & Plan:   MSSA  associated lumbar wound infection extend down to hardware and cause bacteremia: MSSA associated lumbar wound infection that extended down to hardware and caused bacteremia  I am checking ESR, CRP, CMP w GFR, CBC c diff  I am continuing his cefadroxil rx  Atrial flutter now on diltiazem and anticoagulation.   Degenerative joint disease: He and his wife had questions about risk for infections in the future of these and we had extensive discussions about this.     Lower extremity edema: Improved  COVID 19: Recovered    Vaccine counseling: recommended that he receive Prevnar 20 and bivalent COVID vaccine  I spent 42 minutes with the patient including than 50% of the time in face to face counseling of the patient and his wife regarding MSSA bacteremia deep infection involving hardware how we monitor response to treatment, his atrial flutter lower extremity edema personally reviewing the MRI spine, along with review of medical records in preparation for the visit and during the visit and in coordination of his  care.

## 2022-01-05 ENCOUNTER — Encounter: Payer: Self-pay | Admitting: Infectious Disease

## 2022-01-05 ENCOUNTER — Ambulatory Visit: Payer: Medicare PPO | Admitting: Infectious Disease

## 2022-01-05 ENCOUNTER — Other Ambulatory Visit: Payer: Self-pay

## 2022-01-05 VITALS — BP 155/79 | HR 62 | Temp 98.1°F | Wt 197.0 lb

## 2022-01-05 DIAGNOSIS — R7881 Bacteremia: Secondary | ICD-10-CM | POA: Diagnosis not present

## 2022-01-05 DIAGNOSIS — M1990 Unspecified osteoarthritis, unspecified site: Secondary | ICD-10-CM

## 2022-01-05 DIAGNOSIS — A4101 Sepsis due to Methicillin susceptible Staphylococcus aureus: Secondary | ICD-10-CM

## 2022-01-05 DIAGNOSIS — A4901 Methicillin susceptible Staphylococcus aureus infection, unspecified site: Secondary | ICD-10-CM | POA: Diagnosis not present

## 2022-01-05 DIAGNOSIS — E669 Obesity, unspecified: Secondary | ICD-10-CM | POA: Diagnosis not present

## 2022-01-05 DIAGNOSIS — B9561 Methicillin susceptible Staphylococcus aureus infection as the cause of diseases classified elsewhere: Secondary | ICD-10-CM

## 2022-01-05 DIAGNOSIS — T847XXD Infection and inflammatory reaction due to other internal orthopedic prosthetic devices, implants and grafts, subsequent encounter: Secondary | ICD-10-CM | POA: Diagnosis not present

## 2022-01-05 DIAGNOSIS — J9601 Acute respiratory failure with hypoxia: Secondary | ICD-10-CM

## 2022-01-05 DIAGNOSIS — I4892 Unspecified atrial flutter: Secondary | ICD-10-CM | POA: Diagnosis not present

## 2022-01-05 DIAGNOSIS — R600 Localized edema: Secondary | ICD-10-CM | POA: Diagnosis not present

## 2022-01-05 DIAGNOSIS — I471 Supraventricular tachycardia: Secondary | ICD-10-CM

## 2022-01-05 DIAGNOSIS — T8149XA Infection following a procedure, other surgical site, initial encounter: Secondary | ICD-10-CM

## 2022-01-05 DIAGNOSIS — R6520 Severe sepsis without septic shock: Secondary | ICD-10-CM

## 2022-01-05 HISTORY — DX: Unspecified atrial flutter: I48.92

## 2022-01-06 LAB — CBC WITH DIFFERENTIAL/PLATELET
Absolute Monocytes: 374 cells/uL (ref 200–950)
Basophils Absolute: 40 cells/uL (ref 0–200)
Basophils Relative: 0.9 %
Eosinophils Absolute: 172 cells/uL (ref 15–500)
Eosinophils Relative: 3.9 %
HCT: 38.1 % — ABNORMAL LOW (ref 38.5–50.0)
Hemoglobin: 12 g/dL — ABNORMAL LOW (ref 13.2–17.1)
Lymphs Abs: 1333 cells/uL (ref 850–3900)
MCH: 26.2 pg — ABNORMAL LOW (ref 27.0–33.0)
MCHC: 31.5 g/dL — ABNORMAL LOW (ref 32.0–36.0)
MCV: 83.2 fL (ref 80.0–100.0)
MPV: 10.9 fL (ref 7.5–12.5)
Monocytes Relative: 8.5 %
Neutro Abs: 2482 cells/uL (ref 1500–7800)
Neutrophils Relative %: 56.4 %
Platelets: 201 10*3/uL (ref 140–400)
RBC: 4.58 10*6/uL (ref 4.20–5.80)
RDW: 15.8 % — ABNORMAL HIGH (ref 11.0–15.0)
Total Lymphocyte: 30.3 %
WBC: 4.4 10*3/uL (ref 3.8–10.8)

## 2022-01-06 LAB — C-REACTIVE PROTEIN: CRP: 3.9 mg/L (ref <8.0)

## 2022-01-06 LAB — COMPLETE METABOLIC PANEL WITH GFR
AG Ratio: 1.7 (calc) (ref 1.0–2.5)
ALT: 13 U/L (ref 9–46)
AST: 17 U/L (ref 10–35)
Albumin: 3.8 g/dL (ref 3.6–5.1)
Alkaline phosphatase (APISO): 101 U/L (ref 35–144)
BUN: 14 mg/dL (ref 7–25)
CO2: 28 mmol/L (ref 20–32)
Calcium: 9.2 mg/dL (ref 8.6–10.3)
Chloride: 105 mmol/L (ref 98–110)
Creat: 0.78 mg/dL (ref 0.70–1.28)
Globulin: 2.2 g/dL (calc) (ref 1.9–3.7)
Glucose, Bld: 99 mg/dL (ref 65–99)
Potassium: 4.6 mmol/L (ref 3.5–5.3)
Sodium: 139 mmol/L (ref 135–146)
Total Bilirubin: 0.6 mg/dL (ref 0.2–1.2)
Total Protein: 6 g/dL — ABNORMAL LOW (ref 6.1–8.1)
eGFR: 96 mL/min/{1.73_m2} (ref >=60)

## 2022-01-06 LAB — SEDIMENTATION RATE: Sed Rate: 14 mm/h (ref 0–20)

## 2022-01-10 DIAGNOSIS — Z981 Arthrodesis status: Secondary | ICD-10-CM | POA: Diagnosis not present

## 2022-01-10 DIAGNOSIS — M961 Postlaminectomy syndrome, not elsewhere classified: Secondary | ICD-10-CM | POA: Diagnosis not present

## 2022-01-10 DIAGNOSIS — S32009K Unspecified fracture of unspecified lumbar vertebra, subsequent encounter for fracture with nonunion: Secondary | ICD-10-CM | POA: Diagnosis not present

## 2022-01-10 DIAGNOSIS — M419 Scoliosis, unspecified: Secondary | ICD-10-CM | POA: Diagnosis not present

## 2022-01-27 DIAGNOSIS — S32009K Unspecified fracture of unspecified lumbar vertebra, subsequent encounter for fracture with nonunion: Secondary | ICD-10-CM | POA: Diagnosis not present

## 2022-02-02 DIAGNOSIS — S32009K Unspecified fracture of unspecified lumbar vertebra, subsequent encounter for fracture with nonunion: Secondary | ICD-10-CM | POA: Diagnosis not present

## 2022-02-04 DIAGNOSIS — S32009K Unspecified fracture of unspecified lumbar vertebra, subsequent encounter for fracture with nonunion: Secondary | ICD-10-CM | POA: Diagnosis not present

## 2022-02-07 DIAGNOSIS — S32009K Unspecified fracture of unspecified lumbar vertebra, subsequent encounter for fracture with nonunion: Secondary | ICD-10-CM | POA: Diagnosis not present

## 2022-02-10 DIAGNOSIS — S32009K Unspecified fracture of unspecified lumbar vertebra, subsequent encounter for fracture with nonunion: Secondary | ICD-10-CM | POA: Diagnosis not present

## 2022-02-14 DIAGNOSIS — S32009K Unspecified fracture of unspecified lumbar vertebra, subsequent encounter for fracture with nonunion: Secondary | ICD-10-CM | POA: Diagnosis not present

## 2022-02-18 DIAGNOSIS — S32009K Unspecified fracture of unspecified lumbar vertebra, subsequent encounter for fracture with nonunion: Secondary | ICD-10-CM | POA: Diagnosis not present

## 2022-02-21 DIAGNOSIS — S32009K Unspecified fracture of unspecified lumbar vertebra, subsequent encounter for fracture with nonunion: Secondary | ICD-10-CM | POA: Diagnosis not present

## 2022-02-24 DIAGNOSIS — S32009K Unspecified fracture of unspecified lumbar vertebra, subsequent encounter for fracture with nonunion: Secondary | ICD-10-CM | POA: Diagnosis not present

## 2022-02-25 DIAGNOSIS — R202 Paresthesia of skin: Secondary | ICD-10-CM | POA: Diagnosis not present

## 2022-02-25 DIAGNOSIS — M170 Bilateral primary osteoarthritis of knee: Secondary | ICD-10-CM | POA: Diagnosis not present

## 2022-02-25 DIAGNOSIS — M7022 Olecranon bursitis, left elbow: Secondary | ICD-10-CM | POA: Diagnosis not present

## 2022-02-25 DIAGNOSIS — T847XXD Infection and inflammatory reaction due to other internal orthopedic prosthetic devices, implants and grafts, subsequent encounter: Secondary | ICD-10-CM | POA: Diagnosis not present

## 2022-02-25 DIAGNOSIS — I4892 Unspecified atrial flutter: Secondary | ICD-10-CM | POA: Diagnosis not present

## 2022-03-22 NOTE — Progress Notes (Signed)
?Cardiology Office Note:   ? ?Date:  03/25/2022  ? ?ID:  Michael Hill, DOB 1951-06-27, MRN [id number] ? ?PCP:  Antony Contras, MD  ? ?[facility] Providers ?Cardiologist:  Lenna Sciara, MD ?Referring MD: Antony Contras, MD  ? ?Chief Complaint/Reason for Referral:  Cardiology follow up SVT/atrial flutter ? ?ASSESSMENT:   ? ?1. SVT (supraventricular tachycardia) ([place])   ?2. Typical atrial flutter ([place])   ?3. Hyperlipidemia, unspecified hyperlipidemia type   ? ? ?PLAN:   ? ?In order of problems listed above: ?1.  SVT: We will continue diltiazem and Eliquis given coexistent atrial flutter.  He is tolerating therapy well.  He is continue to get stronger with physical therapy.  We will see him back in 6 months or earlier if needed. ?2.  Atrial flutter: The patient is relatively asymptomatic.  I did discuss potentially referring him to electrophysiology so he could tend to get off anticoagulation and diltiazem.  We will discuss this when I see him next time as he continues to get stronger. ?3.  Hyperlipidemia: This is being followed by the patient's primary care provider. ? ?Dispo:  Return in about 6 months (around 09/24/2022).  ? ?  ? ?Medication Adjustments/Labs and Tests Ordered: ?Current medicines are reviewed at length with the patient today.  Concerns regarding medicines are outlined above. ? ?The following changes have been made:  no change  ? ?Labs/tests ordered: ?No orders of the defined types were placed in this encounter. ? ? ?Medication Changes: ?No orders of the defined types were placed in this encounter. ? ? ? ?Current medicines are reviewed at length with the patient today.  The patient does not have concerns regarding medicines. ? ? ?History of Present Illness:   ? ?FOCUSED PROBLEM LIST:   ?1.  Supraventricular tachycardia seen during complicated hospitalization for sepsis due to surgical site wound infection; monitor December 2022 thereafter which demonstrated atrial flutter and supraventricular  tachycardia ? ?December 2022: Seen after long hospitalization with sepsis and supraventricular tachycardia.  Monitor was placed which demonstrated SVT and atrial flutter.  He was started on diltiazem and Eliquis. ? ?January 2023: Telehealth visit: The patient was doing well and continue with more physical therapy. ? ?Today: The patient is a 71 y.o. male with the indicated medical history here for routine follow up.  We had a televisit the last time I corresponded with him.  He was doing well.  He was started on diltiazem and Eliquis.  He is getting stronger and participating with physical therapy.  He feels relatively well.  He denies any severe bleeding or bruising, palpitations, presyncope, or syncope.  His peripheral edema is much improved.  He is required no emergency room visits or hospitalizations. ? ? ? ?Current Medications: ?Current Meds  ?Medication Sig  ? acetaminophen (TYLENOL) 325 MG tablet Take 325 mg by mouth every 6 (six) hours as needed for moderate pain, headache or mild pain.  ? apixaban (ELIQUIS) 5 MG TABS tablet Take 1 tablet (5 mg total) by mouth 2 (two) times daily.  ? atorvastatin (LIPITOR) 20 MG tablet Take 20 mg by mouth daily.  ? cefadroxil (DURICEF) 500 MG capsule Take 1 capsule (500 mg total) by mouth 2 (two) times daily.  ? diltiazem (CARDIZEM CD) 180 MG 24 hr capsule Take 1 capsule (180 mg total) by mouth at bedtime.  ? oxyCODONE (OXY IR/ROXICODONE) 5 MG immediate release tablet Take 1-2 tablets (5-10 mg total) by mouth every 3 (three) hours as needed for moderate pain.  ?  oxyCODONE (OXYCONTIN) 10 mg 12 hr tablet Take 10 mg by mouth every 12 (twelve) hours.  ?  ? ?Allergies:    ?Patient has no known allergies.  ? ?Social History:   ?Social History  ? ?Tobacco Use  ? Smoking status: Never  ? Smokeless tobacco: Never  ?Vaping Use  ? Vaping Use: Never used  ?Substance Use Topics  ? Alcohol use: No  ? Drug use: No  ?  ? ?Family Hx: ?Family History  ?Problem Relation Age of Onset  ? Heart  disease Father   ?     before age 71  ? Hyperlipidemia Father   ? Hypertension Father   ? Hypertension Brother   ? Heart disease Brother   ?     before age 71  ? Cancer Daughter   ?  ? ?Review of Systems:   ?Please see the history of present illness.    ?All other systems reviewed and are negative. ?  ? ? ?EKGs/Labs/Other Test Reviewed:   ? ?EKG:  Not done today ? ?Prior CV studies: ? ?12/22  TTE ? 1. Left ventricular ejection fraction, by estimation, is 55 to 60%. The left ventricle has normal function. The left ventricle has no regional wall motion abnormalities. Left ventricular diastolic function could not be evaluated.  ? 2. Right ventricular systolic function was not well visualized. The right ventricular size is not well visualized. There is normal pulmonary artery systolic pressure.  ? 3. Left atrial size was mildly dilated.  ? 4. The mitral valve is normal in structure. Trivial mitral valve regurgitation.  ? 5. The aortic valve is normal in structure. Aortic valve regurgitation is not visualized.  ? ?12/22 Monitor ?2 Ventricular Tachycardia runs occurred, the run with the fastest interval lasting 4 beats with a max rate of 197 bpm, the longest  ?lasting 4 beats with an avg rate of 153 bpm.  ?  ?2720 Supraventricular Tachycardia runs occurred, the run with the fastest interval lasting 18 beats with a max rate of 255 bpm, the longest lasting 29.5 secs with an avg rate of 111 bpm.  ?  ?Atrial Flutter occurred (6% burden), ranging from 67-218 bpm (avg of 112 bpm), the longest lasting 15 mins 12 secs with an avg rate of 155 bpm.  ?  ?Isolated SVEs were frequent (8.7%, 70544), SVE Couplets were occasional (2.7%, 11073), and SVE Triplets were occasional (1.8%, 5001). Isolated VEs were occasional (1.1%, 8820), VE Couplets were rare (<1.0%, 323), and VE Triplets were rare (<1.0%, 11).  ?Ventricular Bigeminy and Trigeminy were present.  ?  ?No atrial fibrillation, ventricular tachyarrhythmias, or bradyarrhythmias  were detected.  Atrial flutter was detected as specified above. ? ?Other studies Reviewed: ?Review of the additional studies/records demonstrates: None relevant ? ?Recent Labs: ?09/08/2021: B Natriuretic Peptide 503.3 ?09/27/2021: TSH 1.914 ?09/28/2021: Magnesium 1.8 ?01/05/2022: ALT 13; BUN 14; Creat 0.78; Hemoglobin 12.0; Platelets 201; Potassium 4.6; Sodium 139  ? ?Recent Lipid Panel ?No results found for: CHOL, TRIG, HDL, LDLCALC, LDLDIRECT ? ?Risk Assessment/Calculations:   ? ? ?    ? ?Physical Exam:   ? ?VS:  BP 116/73   Pulse 73   Ht 5\' 7"  (1.702 m)   Wt 203 lb (92.1 kg)   SpO2 97%   BMI 31.79 kg/m?    ?Wt Readings from Last 3 Encounters:  ?03/25/22 203 lb (92.1 kg)  ?01/05/22 197 lb (89.4 kg)  ?12/10/21 195 lb (88.5 kg)  ?  ?GENERAL:  No apparent distress, AOx3,  ambulating with cane ?HEENT:  No carotid bruits, +2 carotid impulses, no scleral icterus ?CAR: RRR no murmurs, gallops, rubs, or thrills ?RES:  Clear to auscultation bilaterally ?ABD:  Soft, nontender, nondistended, positive bowel sounds x 4 ?VASC:  +2 radial pulses, +2 carotid pulses, palpable pedal pulses ?NEURO:  CN 2-12 grossly intact; motor and sensory grossly intact ?PSYCH:  No active depression or anxiety ?EXT:  No edema, ecchymosis, or cyanosis ? ?Signed, ?Michael Osmond, MD  ?03/25/2022 9:45 AM    ?[facility] ?[address], [place], [place]  [address] ?Phone: [phone]; Fax: [phone]  ? ?Note:  This document was prepared using Dragon voice recognition software and may include unintentional dictation errors. ?

## 2022-03-25 ENCOUNTER — Encounter: Payer: Self-pay | Admitting: Internal Medicine

## 2022-03-25 ENCOUNTER — Ambulatory Visit: Payer: Medicare PPO | Admitting: Internal Medicine

## 2022-03-25 VITALS — BP 116/73 | HR 73 | Ht 67.0 in | Wt 203.0 lb

## 2022-03-25 DIAGNOSIS — I471 Supraventricular tachycardia, unspecified: Secondary | ICD-10-CM

## 2022-03-25 DIAGNOSIS — I483 Typical atrial flutter: Secondary | ICD-10-CM | POA: Diagnosis not present

## 2022-03-25 DIAGNOSIS — E785 Hyperlipidemia, unspecified: Secondary | ICD-10-CM | POA: Diagnosis not present

## 2022-03-25 NOTE — Patient Instructions (Signed)
Medication Instructions:  No changes *If you need a refill on your cardiac medications before your next appointment, please call your pharmacy*   Lab Work: none  Testing/Procedures: none   Follow-Up: At [facility], you and your health needs are our priority.  As part of our continuing mission to provide you with exceptional heart care, we have created designated Provider Care Teams.  These Care Teams include your primary Cardiologist (physician) and Advanced Practice Providers (APPs -  Physician Assistants and Nurse Practitioners) who all work together to provide you with the care you need, when you need it.   Your next appointment:   6 month(s)  The format for your next appointment:   In Person  Provider:   Arun K Thukkani, MD     Important Information About Sugar       

## 2022-04-18 ENCOUNTER — Other Ambulatory Visit: Payer: Self-pay

## 2022-04-18 ENCOUNTER — Encounter: Payer: Self-pay | Admitting: Infectious Disease

## 2022-04-18 ENCOUNTER — Ambulatory Visit: Payer: Medicare PPO | Admitting: Infectious Disease

## 2022-04-18 VITALS — BP 127/75 | HR 78 | Resp 16 | Ht 67.0 in | Wt 200.0 lb

## 2022-04-18 DIAGNOSIS — R200 Anesthesia of skin: Secondary | ICD-10-CM | POA: Diagnosis not present

## 2022-04-18 DIAGNOSIS — G062 Extradural and subdural abscess, unspecified: Secondary | ICD-10-CM | POA: Diagnosis not present

## 2022-04-18 DIAGNOSIS — L0291 Cutaneous abscess, unspecified: Secondary | ICD-10-CM

## 2022-04-18 DIAGNOSIS — T847XXD Infection and inflammatory reaction due to other internal orthopedic prosthetic devices, implants and grafts, subsequent encounter: Secondary | ICD-10-CM

## 2022-04-18 DIAGNOSIS — T8149XA Infection following a procedure, other surgical site, initial encounter: Secondary | ICD-10-CM

## 2022-04-18 DIAGNOSIS — R6520 Severe sepsis without septic shock: Secondary | ICD-10-CM | POA: Diagnosis not present

## 2022-04-18 DIAGNOSIS — A4101 Sepsis due to Methicillin susceptible Staphylococcus aureus: Secondary | ICD-10-CM

## 2022-04-18 DIAGNOSIS — J9601 Acute respiratory failure with hypoxia: Secondary | ICD-10-CM

## 2022-04-18 DIAGNOSIS — B9561 Methicillin susceptible Staphylococcus aureus infection as the cause of diseases classified elsewhere: Secondary | ICD-10-CM

## 2022-04-18 HISTORY — DX: Anesthesia of skin: R20.0

## 2022-04-18 NOTE — Progress Notes (Signed)
? ?Subjective:  ?Chief complaint: Bilateral hand numbness and pain ? ? Patient ID: Michael Hill, male    DOB: [birth year], 71 y.o.   MRN: [id number] ? ?HPI ? ?71 year old Caucasian man who had had final stenosis and lumbar radiculopathy status post L5-S1 decompression and fusion on September 21 then admitted October 5 with infection of his surgical site and frank bacteremia with MSSA and severe sepsis. ? ?Blood cultures cleared he was ultimately placed on cefazolin and then rifampin was added. ? ?As an outpatient he did not do very well on the rifampin with nausea malaise and poor appetite. ? ?He then came back to the ER with worsening back pain and right lower extremity weakness and drainage from the wound. ? ?MRI of the spine was performed which showed enhancing fluid collection in the soft tissues posterior lower lumbar spine was 11 x 1 x 3.3 cm in dimensions. ? ?Patient was taken the operating room by Dr. Reatha Armour who found purulence extending down to hardware. ? ?Patient was initially switched to nafcillin but then switch back to cefazolin.  We abandoned the idea of giving him any rifampin due to his poor tolerance of this is in the outpatient world. ? ?He ultimately was discharged to home and is receiving cefazolin at home. ? ?He has had worsening lower extremity edema while at home and is wonder if this was related to antibiotics the only way that I can think it is related the antibiotics is that he is getting salt saline infusions with the cefazolin dose during the day. ? ?Back pain improved dramatically. ? ?He finished IV cefazolin and switched over to cefadroxil. ? ?Really cannot tolerate the higher dose of 1000 g twice a day so was changed to 500 twice a day with resolution of his loose stools. ? ?He was diagnosed now with atrial flutter by cardiology who now have him on anticoagulation and diltiazem. ? ?He continues to have back pain but more so at night and sometimes when he does things such as trying  to lift heavy objects which he knows he should not be doing. ? ?He is also noticed over the last month or 2 that he has had increasing numbness and pain in his hands bilaterally.  He had googled this and wondered if it might be carpal tunnel syndrome. ? ?Some of his symptoms could be consistent with this but I am concerned about the fact that he was just bacteremic with MSSA and that certainly he could have seeded the cervical spine. ? ? ? ? ? ? ?Past Medical History:  ?Diagnosis Date  ? Arthritis   ? Atrial flutter ([place]) 01/05/2022  ? Chronic back pain   ? scoliosis/stenosis/spondylosis  ? Herpes zoster   ? History of bronchitis   ? many yrs ago  ? History of elevated PSA   ? History of shingles   ? Hypercholesterolemia   ? Hyperlipidemia   ? takes Lipitor daily  ? Hypertension   ? takes Lotrel daily  ? Lower extremity edema 10/22/2021  ? Prediabetes   ? Severe obesity ([place])   ? ? ?Past Surgical History:  ?Procedure Laterality Date  ? ABDOMINAL EXPOSURE N/A 08/05/2014  ? Procedure: ABDOMINAL EXPOSURE;  Surgeon: Angelia Mould, MD;  Location: [facility];  Service: Vascular;  Laterality: N/A;  ? ANTERIOR LAT LUMBAR FUSION Right 08/05/2014  ? Procedure: Right Lumbar two-three,Lumbar three-four,Lumbar four-five Anterior lateral lumbar interbody fusion;  Surgeon: Erline Levine, MD;  Location: [facility];  Service: Neurosurgery;  Laterality: Right;  right ?  ? ANTERIOR LUMBAR FUSION N/A 08/05/2014  ? Procedure: Lumbar five-Sacral-One Anterior lumbar interbody fusion with Dr. Scot Dock for approach; Right Lumbar two-three,Lumbar three-four,Lumbar four-five Anterior lateral lumbar interbody fusion;  Surgeon: Erline Levine, MD;  Location: [facility];  Service: Neurosurgery;  Laterality: N/A;  ? APPLICATION OF INTRAOPERATIVE CT SCAN N/A 08/24/2021  ? Procedure: APPLICATION OF INTRAOPERATIVE CT SCAN;  Surgeon: Erline Levine, MD;  Location: [facility];  Service: Neurosurgery;  Laterality: N/A;  ? APPLICATION OF WOUND VAC N/A  09/24/2021  ? Procedure: APPLICATION OF WOUND VAC;  Surgeon: Dawley, Theodoro Doing, DO;  Location: [facility];  Service: Neurosurgery;  Laterality: N/A;  ? COLONOSCOPY    ? EPIDURAL BLOCK INJECTION    ? KNEE ARTHROSCOPY Bilateral   ? x 4   ? LUMBAR FUSION  08/05/2014       ? DR Vertell Limber  ? LUMBAR PERCUTANEOUS PEDICLE SCREW 3 LEVEL N/A 08/07/2014  ? Procedure: Stage II Lumbar percutaneous pedicle screw placement L2-S1;  Surgeon: Erline Levine, MD;  Location: [facility];  Service: Neurosurgery;  Laterality: N/A;  Stage II Lumbar percutaneous pedicle screw placement L2-S1  ? LUMBAR WOUND DEBRIDEMENT N/A 09/08/2021  ? Procedure: Incision and drainage of lumbar wound;  Surgeon: Erline Levine, MD;  Location: [facility];  Service: Neurosurgery;  Laterality: N/A;  ? PROSTATE BIOPSY    ? ROTATOR CUFF REPAIR Right   ? TEE WITHOUT CARDIOVERSION N/A 09/14/2021  ? Procedure: TRANSESOPHAGEAL ECHOCARDIOGRAM (TEE);  Surgeon: Lelon Perla, MD;  Location: [facility];  Service: Cardiovascular;  Laterality: N/A;  ? WOUND EXPLORATION N/A 09/24/2021  ? Procedure: Lumbar Wound Irrigation and Debridement.;  Surgeon: Karsten Ro, DO;  Location: [facility];  Service: Neurosurgery;  Laterality: N/A;  Wound appplied  ? ? ?Family History  ?Problem Relation Age of Onset  ? Heart disease Father   ?     before age 79  ? Hyperlipidemia Father   ? Hypertension Father   ? Hypertension Brother   ? Heart disease Brother   ?     before age 43  ? Cancer Daughter   ? ? ?  ?Social History  ? ?Socioeconomic History  ? Marital status: Married  ?  Spouse name: Not on file  ? Number of children: Not on file  ? Years of education: Not on file  ? Highest education level: Not on file  ?Occupational History  ? Not on file  ?Tobacco Use  ? Smoking status: Never  ? Smokeless tobacco: Never  ?Vaping Use  ? Vaping Use: Never used  ?Substance and Sexual Activity  ? Alcohol use: No  ? Drug use: No  ? Sexual activity: Not Currently  ?Other Topics Concern  ? Not on file  ?Social History  Narrative  ? Not on file  ? ?Social Determinants of Health  ? ?Financial Resource Strain: Not on file  ?Food Insecurity: Not on file  ?Transportation Needs: Not on file  ?Physical Activity: Not on file  ?Stress: Not on file  ?Social Connections: Not on file  ? ? ?No Known Allergies ? ? ?Current Outpatient Medications:  ?  acetaminophen (TYLENOL) 325 MG tablet, Take 325 mg by mouth every 6 (six) hours as needed for moderate pain, headache or mild pain., Disp: , Rfl:  ?  apixaban (ELIQUIS) 5 MG TABS tablet, Take 1 tablet (5 mg total) by mouth 2 (two) times daily., Disp: 60 tablet, Rfl: 11 ?  atorvastatin (  LIPITOR) 20 MG tablet, Take 20 mg by mouth daily., Disp: , Rfl:  ?  cefadroxil (DURICEF) 500 MG capsule, Take 1 capsule (500 mg total) by mouth 2 (two) times daily., Disp: 120 capsule, Rfl: 11 ?  diltiazem (CARDIZEM CD) 180 MG 24 hr capsule, Take 1 capsule (180 mg total) by mouth at bedtime., Disp: 90 capsule, Rfl: 3 ?  oxyCODONE (OXY IR/ROXICODONE) 5 MG immediate release tablet, Take 1-2 tablets (5-10 mg total) by mouth every 3 (three) hours as needed for moderate pain., Disp: 30 tablet, Rfl: 0 ?  oxyCODONE (OXYCONTIN) 10 mg 12 hr tablet, Take 10 mg by mouth every 12 (twelve) hours., Disp: , Rfl:  ? ? ?Review of Systems  ?Constitutional:  Negative for activity change, appetite change, chills, diaphoresis, fatigue, fever and unexpected weight change.  ?HENT:  Negative for congestion, rhinorrhea, sinus pressure, sneezing, sore throat and trouble swallowing.   ?Eyes:  Negative for photophobia and visual disturbance.  ?Respiratory:  Negative for cough, chest tightness, shortness of breath, wheezing and stridor.   ?Cardiovascular:  Negative for chest pain, palpitations and leg swelling.  ?Gastrointestinal:  Negative for abdominal distention, abdominal pain, anal bleeding, blood in stool, constipation, diarrhea, nausea and vomiting.  ?Genitourinary:  Negative for difficulty urinating, dysuria, flank pain and hematuria.   ?Musculoskeletal:  Positive for back pain. Negative for arthralgias, gait problem, joint swelling and myalgias.  ?Skin:  Negative for color change, pallor, rash and wound.  ?Neurological:  Positive for numbness

## 2022-04-19 LAB — BASIC METABOLIC PANEL WITH GFR
BUN: 18 mg/dL (ref 7–25)
CO2: 28 mmol/L (ref 20–32)
Calcium: 9.1 mg/dL (ref 8.6–10.3)
Chloride: 106 mmol/L (ref 98–110)
Creat: 0.91 mg/dL (ref 0.70–1.28)
Glucose, Bld: 108 mg/dL — ABNORMAL HIGH (ref 65–99)
Potassium: 4.4 mmol/L (ref 3.5–5.3)
Sodium: 140 mmol/L (ref 135–146)
eGFR: 91 mL/min/{1.73_m2} (ref >=60)

## 2022-04-19 LAB — CBC WITH DIFFERENTIAL/PLATELET
Absolute Monocytes: 416 cells/uL (ref 200–950)
Basophils Absolute: 21 cells/uL (ref 0–200)
Basophils Relative: 0.4 %
Eosinophils Absolute: 109 cells/uL (ref 15–500)
Eosinophils Relative: 2.1 %
HCT: 40.8 % (ref 38.5–50.0)
Hemoglobin: 13.4 g/dL (ref 13.2–17.1)
Lymphs Abs: 1388 cells/uL (ref 850–3900)
MCH: 28.9 pg (ref 27.0–33.0)
MCHC: 32.8 g/dL (ref 32.0–36.0)
MCV: 88.1 fL (ref 80.0–100.0)
MPV: 11.1 fL (ref 7.5–12.5)
Monocytes Relative: 8 %
Neutro Abs: 3266 cells/uL (ref 1500–7800)
Neutrophils Relative %: 62.8 %
Platelets: 179 10*3/uL (ref 140–400)
RBC: 4.63 10*6/uL (ref 4.20–5.80)
RDW: 14 % (ref 11.0–15.0)
Total Lymphocyte: 26.7 %
WBC: 5.2 10*3/uL (ref 3.8–10.8)

## 2022-04-19 LAB — SEDIMENTATION RATE: Sed Rate: 14 mm/h (ref 0–20)

## 2022-04-19 LAB — C-REACTIVE PROTEIN: CRP: 12.3 mg/L — ABNORMAL HIGH (ref <8.0)

## 2022-04-20 DIAGNOSIS — T148XXA Other injury of unspecified body region, initial encounter: Secondary | ICD-10-CM | POA: Diagnosis not present

## 2022-04-20 DIAGNOSIS — M961 Postlaminectomy syndrome, not elsewhere classified: Secondary | ICD-10-CM | POA: Diagnosis not present

## 2022-04-20 DIAGNOSIS — M419 Scoliosis, unspecified: Secondary | ICD-10-CM | POA: Diagnosis not present

## 2022-04-20 DIAGNOSIS — S32009K Unspecified fracture of unspecified lumbar vertebra, subsequent encounter for fracture with nonunion: Secondary | ICD-10-CM | POA: Diagnosis not present

## 2022-04-21 ENCOUNTER — Encounter: Payer: Self-pay | Admitting: Infectious Disease

## 2022-04-29 ENCOUNTER — Encounter: Payer: Self-pay | Admitting: Infectious Disease

## 2022-06-28 DIAGNOSIS — B351 Tinea unguium: Secondary | ICD-10-CM | POA: Diagnosis not present

## 2022-06-28 DIAGNOSIS — Z85828 Personal history of other malignant neoplasm of skin: Secondary | ICD-10-CM | POA: Diagnosis not present

## 2022-06-28 DIAGNOSIS — B353 Tinea pedis: Secondary | ICD-10-CM | POA: Diagnosis not present

## 2022-07-07 DIAGNOSIS — H2513 Age-related nuclear cataract, bilateral: Secondary | ICD-10-CM | POA: Diagnosis not present

## 2022-07-07 DIAGNOSIS — H43812 Vitreous degeneration, left eye: Secondary | ICD-10-CM | POA: Diagnosis not present

## 2022-07-07 DIAGNOSIS — D3132 Benign neoplasm of left choroid: Secondary | ICD-10-CM | POA: Diagnosis not present

## 2022-07-20 DIAGNOSIS — Z6831 Body mass index (BMI) 31.0-31.9, adult: Secondary | ICD-10-CM | POA: Diagnosis not present

## 2022-07-20 DIAGNOSIS — T148XXA Other injury of unspecified body region, initial encounter: Secondary | ICD-10-CM | POA: Diagnosis not present

## 2022-08-22 DIAGNOSIS — I4892 Unspecified atrial flutter: Secondary | ICD-10-CM | POA: Diagnosis not present

## 2022-08-22 DIAGNOSIS — I10 Essential (primary) hypertension: Secondary | ICD-10-CM | POA: Diagnosis not present

## 2022-08-22 DIAGNOSIS — D6869 Other thrombophilia: Secondary | ICD-10-CM | POA: Diagnosis not present

## 2022-08-22 DIAGNOSIS — E782 Mixed hyperlipidemia: Secondary | ICD-10-CM | POA: Diagnosis not present

## 2022-08-22 DIAGNOSIS — R7303 Prediabetes: Secondary | ICD-10-CM | POA: Diagnosis not present

## 2022-08-22 DIAGNOSIS — Z0000 Encounter for general adult medical examination without abnormal findings: Secondary | ICD-10-CM | POA: Diagnosis not present

## 2022-08-22 DIAGNOSIS — T847XXD Infection and inflammatory reaction due to other internal orthopedic prosthetic devices, implants and grafts, subsequent encounter: Secondary | ICD-10-CM | POA: Diagnosis not present

## 2022-08-22 DIAGNOSIS — M545 Low back pain, unspecified: Secondary | ICD-10-CM | POA: Diagnosis not present

## 2022-09-07 NOTE — Progress Notes (Signed)
Cardiology Office Note:    Date:  09/14/2022   ID:  Michael Hill, DOB 07/07/1951, MRN [id number]  PCP:  Antony Contras, MD   [facility] Cardiologist:  Lenna Sciara, MD Referring MD: Antony Contras, MD   Chief Complaint/Reason for Referral:  Cardiology follow up SVT/atrial flutter  ASSESSMENT:    1. SVT (supraventricular tachycardia)   2. Typical atrial flutter ([place])   3. Aortic atherosclerosis ([place])   4. Coronary artery calcification seen on CAT scan   5. Hyperlipidemia LDL goal <70      PLAN:    In order of problems listed above: 1.  SVT: He is not having any palpitations currently.  We will obtain a monitor off diltiazem see discussion below. 2.  Atrial flutter: The patient is interested in reducing the amount of medication he needs to take.  His atrial flutter occurred in December a few weeks after his admission for sepsis.  We had a long discussion about potential treatment pathways.  We have decided to stop his diltiazem but continue his Eliquis.  We will obtain a monitor.  If this does not show atrial flutter we will consider stopping his Eliquis.  If it shows SVT we may restart his diltiazem.  He is reluctant to have any other procedures such as an ablation given the complications he has sustained with other procedures.   3.  Aortic atherosclerosis: Continue apixaban and aspirin and atorvastatin; strict blood pressure control. 4.  Coronary artery calcifications: See #3 above. 5.  Hyperlipidemia: LDL recently was 72 as checked by her PCP.  LP(a) today.  Dispo:  Return in about 6 months (around 03/16/2023).      Medication Adjustments/Labs and Tests Ordered: Current medicines are reviewed at length with the patient today.  Concerns regarding medicines are outlined above.  The following changes have been made:  no change   Labs/tests ordered: Orders Placed This Encounter  Procedures   Lipoprotein A (LPA)   LONG TERM MONITOR (3-14 DAYS)   EKG 12-Lead     Medication Changes: No orders of the defined types were placed in this encounter.    Current medicines are reviewed at length with the patient today.  The patient does not have concerns regarding medicines.   History of Present Illness:    FOCUSED PROBLEM LIST:   1.  Supraventricular tachycardia seen during complicated hospitalization for sepsis due to surgical site wound infection; monitor December 2022 thereafter which demonstrated atrial flutter and supraventricular tachycardia 2.  Coronary artery calcification on CT 2022 3.  Aortic atherosclerosis on CT lumbar spine 2017  December 2022: Seen after long hospitalization with sepsis and supraventricular tachycardia.  Monitor was placed which demonstrated SVT and atrial flutter.  He was started on diltiazem and Eliquis.  January 2023: Telehealth visit: The patient was doing well and continue with more physical therapy.  April:    We had a televisit the last time I corresponded with him.  He was doing well.  He was started on diltiazem and Eliquis.  He is getting stronger and participating with physical therapy.  He feels relatively well.  He denies any severe bleeding or bruising, palpitations, presyncope, or syncope.  His peripheral edema is much improved.  He is required no emergency room visits or hospitalizations.  Plan.  Continue medical therapy; consider EP referral as patient gets stronger.  Today: The patient is doing fairly well.  He denies any significant cardiovascular complaints.  He has had no palpitations, paroxysmal nocturnal dyspnea,  orthopnea.  He is not able to do as much as he did several years ago and he attributes this to old age.  He has had no significant bleeding or bruising on Eliquis.  He is required no hospitalizations or emergency room visits.  He feels relatively well.   Current Medications: Current Meds  Medication Sig   acetaminophen (TYLENOL) 325 MG tablet Take 325 mg by mouth every 6 (six) hours as  needed for moderate pain, headache or mild pain.   apixaban (ELIQUIS) 5 MG TABS tablet Take 1 tablet (5 mg total) by mouth 2 (two) times daily.   atorvastatin (LIPITOR) 20 MG tablet Take 20 mg by mouth daily.   cefadroxil (DURICEF) 500 MG capsule Take 1 capsule (500 mg total) by mouth 2 (two) times daily.   oxyCODONE (OXY IR/ROXICODONE) 5 MG immediate release tablet Take 1-2 tablets (5-10 mg total) by mouth every 3 (three) hours as needed for moderate pain.   oxyCODONE (OXYCONTIN) 10 mg 12 hr tablet Take 10 mg by mouth every 12 (twelve) hours.   [DISCONTINUED] diltiazem (CARDIZEM CD) 180 MG 24 hr capsule Take 1 capsule (180 mg total) by mouth at bedtime.     Allergies:    Patient has no known allergies.   Social History:   Social History   Tobacco Use   Smoking status: Never   Smokeless tobacco: Never  Vaping Use   Vaping Use: Never used  Substance Use Topics   Alcohol use: No   Drug use: No     Family Hx: Family History  Problem Relation Age of Onset   Heart disease Father        before age 64   Hyperlipidemia Father    Hypertension Father    Hypertension Brother    Heart disease Brother        before age 83   Cancer Daughter      Review of Systems:   Please see the history of present illness.    All other systems reviewed and are negative.     EKGs/Labs/Other Test Reviewed:    EKG: Done today that I personally reviewed demonstrates sinus bradycardia with a first-degree AV block  Prior CV studies:  12/22  TTE  1. Left ventricular ejection fraction, by estimation, is 55 to 60%. The left ventricle has normal function. The left ventricle has no regional wall motion abnormalities. Left ventricular diastolic function could not be evaluated.   2. Right ventricular systolic function was not well visualized. The right ventricular size is not well visualized. There is normal pulmonary artery systolic pressure.   3. Left atrial size was mildly dilated.   4. The mitral  valve is normal in structure. Trivial mitral valve regurgitation.   5. The aortic valve is normal in structure. Aortic valve regurgitation is not visualized.   12/22 Monitor -2 Ventricular Tachycardia runs occurred, the run with the fastest interval lasting 4 beats with a max rate of 197 bpm, the longest  lasting 4 beats with an avg rate of 153 bpm.   -2720 Supraventricular Tachycardia runs occurred, the run with the fastest interval lasting 18 beats with a max rate of 255 bpm, the longest lasting 29.5 secs with an avg rate of 111 bpm.   -Atrial Flutter occurred (6% burden), ranging from 67-218 bpm (avg of 112 bpm), the longest lasting 15 mins 12 secs with an avg rate of 155 bpm.    -Isolated SVEs were frequent (8.7%, 70544), SVE Couplets were occasional (2.7%,  11073), and SVE Triplets were occasional (1.8%, 5001). Isolated VEs were occasional (1.1%, 8820), VE Couplets were rare (<1.0%, 323), and VE Triplets were rare (<1.0%, 11). Ventricular Bigeminy and Trigeminy were present.    -No atrial fibrillation, ventricular tachyarrhythmias, or bradyarrhythmias were detected.  Atrial flutter was detected as specified above.  Other studies Reviewed: Review of the additional studies/records demonstrates: CT lumbar spine 2017 demonstrates aortic atherosclerosis; PE protocol CT 2022 demonstrates coronary artery calcification  Recent Labs: 09/27/2021: TSH 1.914 09/28/2021: Magnesium 1.8 01/05/2022: ALT 13 04/18/2022: BUN 18; Creat 0.91; Hemoglobin 13.4; Platelets 179; Potassium 4.4; Sodium 140   Recent Lipid Panel No results found for: "CHOL", "TRIG", "HDL", "LDLCALC", "LDLDIRECT"  Lipid panel done by his primary care provider external to assessment demonstrated an LDL of 72 in September and an HDL of 50.  Risk Assessment/Calculations:          Physical Exam:    VS:  BP 110/60 (BP Location: Left Arm, Patient Position: Sitting, Cuff Size: Normal)   Pulse (!) 58   Ht 5\' 7"  (1.702 m)   Wt 204  lb (92.5 kg)   SpO2 94%   BMI 31.95 kg/m    Wt Readings from Last 3 Encounters:  09/14/22 204 lb (92.5 kg)  04/18/22 200 lb (90.7 kg)  03/25/22 203 lb (92.1 kg)    GENERAL:  No apparent distress, AOx3, ambulating with cane HEENT:  No carotid bruits, +2 carotid impulses, no scleral icterus CAR: RRR no murmurs, gallops, rubs, or thrills RES:  Clear to auscultation bilaterally ABD:  Soft, nontender, nondistended, positive bowel sounds x 4 VASC:  +2 radial pulses, +2 carotid pulses, palpable pedal pulses NEURO:  CN 2-12 grossly intact; motor and sensory grossly intact PSYCH:  No active depression or anxiety EXT:  No edema, ecchymosis, or cyanosis  Signed, 03/27/22, MD  09/14/2022 11:03 AM    [facility] [address] [place], [place], [place]  [place] Phone: [phone]; Fax: [phone]   Note:  This document was prepared using Dragon voice recognition software and may include unintentional dictation errors.

## 2022-09-14 ENCOUNTER — Ambulatory Visit: Payer: Medicare PPO | Attending: Internal Medicine | Admitting: Internal Medicine

## 2022-09-14 ENCOUNTER — Ambulatory Visit (INDEPENDENT_AMBULATORY_CARE_PROVIDER_SITE_OTHER): Payer: Medicare PPO

## 2022-09-14 ENCOUNTER — Encounter: Payer: Self-pay | Admitting: Internal Medicine

## 2022-09-14 VITALS — BP 110/60 | HR 58 | Ht 67.0 in | Wt 204.0 lb

## 2022-09-14 DIAGNOSIS — E785 Hyperlipidemia, unspecified: Secondary | ICD-10-CM

## 2022-09-14 DIAGNOSIS — I483 Typical atrial flutter: Secondary | ICD-10-CM

## 2022-09-14 DIAGNOSIS — I700 Atherosclerosis of aorta: Secondary | ICD-10-CM

## 2022-09-14 DIAGNOSIS — I2510 Atherosclerotic heart disease of native coronary artery without angina pectoris: Secondary | ICD-10-CM

## 2022-09-14 DIAGNOSIS — I471 Supraventricular tachycardia, unspecified: Secondary | ICD-10-CM

## 2022-09-14 NOTE — Progress Notes (Unsigned)
Enrolled for [employer] to mail a ZIO XT long term holter monitor to the patients address on file.  

## 2022-09-14 NOTE — Patient Instructions (Addendum)
Medication Instructions:  Your physician has recommended you make the following change in your medication:   Stop taking diltiazem   *If you need a refill on your cardiac medications before your next appointment, please call your pharmacy*   Lab Work: Lpa  If you have labs (blood work) drawn today and your tests are completely normal, you will receive your results only by: MyChart Message (if you have MyChart) OR A paper copy in the mail If you have any lab test that is abnormal or we need to change your treatment, we will call you to review the results.   Testing/Procedures: Your physician has recommended that you wear an event monitor. Event monitors are medical devices that record the heart's electrical activity. Doctors most often [place] these monitors to diagnose arrhythmias. Arrhythmias are problems with the speed or rhythm of the heartbeat. The monitor is a small, portable device. You can wear one while you do your normal daily activities. This is usually used to diagnose what is causing palpitations/syncope (passing out).  Start monitor one week after stopping diltiazem    Follow-Up: At [facility], you and your health needs are our priority.  As part of our continuing mission to provide you with exceptional heart care, we have created designated Provider Care Teams.  These Care Teams include your primary Cardiologist (physician) and Advanced Practice Providers (APPs -  Physician Assistants and Nurse Practitioners) who all work together to provide you with the care you need, when you need it.  We recommend signing up for the patient portal called "MyChart".  Sign up information is provided on this After Visit Summary.  MyChart is used to connect with patients for Virtual Visits (Telemedicine).  Patients are able to view lab/test results, encounter notes, upcoming appointments, etc.  Non-urgent messages can be sent to your provider as well.   To learn more about what you can do  with MyChart, go to [email or web address].    Your next appointment:   6 month(s)  The format for your next appointment:   In Person  Provider:   [name], MD     Other Instructions Christena Deem- Long Term Monitor Instructions  Your physician has requested you wear a ZIO patch monitor for 14 days.  This is a single patch monitor. [employer] supplies one patch monitor per enrollment. Additional stickers are not available. Please do not apply patch if you will be having a Nuclear Stress Test,  Echocardiogram, Cardiac CT, MRI, or Chest Xray during the period you would be wearing the  monitor. The patch cannot be worn during these tests. You cannot remove and re-apply the  ZIO XT patch monitor.  Your ZIO patch monitor will be mailed 3 day USPS to your address on file. It may take 3-5 days  to receive your monitor after you have been enrolled.  Once you have received your monitor, please review the enclosed instructions. Your monitor  has already been registered assigning a specific monitor serial # to you.  Billing and Patient Assistance Program Information  We have supplied [employer] with any of your insurance information on file for billing purposes. [employer] offers a sliding scale Patient Assistance Program for patients that do not have  insurance, or whose insurance does not completely cover the cost of the ZIO monitor.  You must apply for the Patient Assistance Program to qualify for this discounted rate.  To apply, please call [employer] at [phone], select option 4, select option 2, ask to apply  for  Patient Assistance Program. Theodore Demark will ask your household income, and how many people  are in your household. They will quote your out-of-pocket cost based on that information.  [employer] will also be able to set up a 22-month, interest-free payment plan if needed.  Applying the monitor   Shave hair from upper left chest.  Hold abrader disc by orange tab. Rub abrader in 40 strokes  over the upper left chest as  indicated in your monitor instructions.  Clean area with 4 enclosed alcohol pads. Let dry.  Apply patch as indicated in monitor instructions. Patch will be placed under collarbone on left  side of chest with arrow pointing upward.  Rub patch adhesive wings for 2 minutes. Remove white label marked "1". Remove the white  label marked "2". Rub patch adhesive wings for 2 additional minutes.  While looking in a mirror, press and release button in center of patch. A small green light will  flash 3-4 times. This will be your only indicator that the monitor has been turned on.  Do not shower for the first 24 hours. You may shower after the first 24 hours.  Press the button if you feel a symptom. You will hear a small click. Record Date, Time and  Symptom in the Patient Logbook.  When you are ready to remove the patch, follow instructions on the last 2 pages of Patient  Logbook. Stick patch monitor onto the last page of Patient Logbook.  Place Patient Logbook in the blue and white box. Use locking tab on box and tape box closed  securely. The blue and white box has prepaid postage on it. Please place it in the mailbox as  soon as possible. Your physician should have your test results approximately 7 days after the  monitor has been mailed back to [facility].  Call [place] at [phone] if you have questions regarding  your ZIO XT patch monitor. Call them immediately if you see an orange light blinking on your  monitor.  If your monitor falls off in less than 4 days, contact our Monitor department at [phone].  If your monitor becomes loose or falls off after 4 days call [employer] at [phone] for  suggestions on securing your monitor

## 2022-09-15 LAB — LIPOPROTEIN A (LPA): Lipoprotein (a): 53.6 nmol/L (ref <75.0)

## 2022-09-20 DIAGNOSIS — I483 Typical atrial flutter: Secondary | ICD-10-CM | POA: Diagnosis not present

## 2022-09-20 DIAGNOSIS — I471 Supraventricular tachycardia, unspecified: Secondary | ICD-10-CM | POA: Diagnosis not present

## 2022-10-06 ENCOUNTER — Other Ambulatory Visit: Payer: Self-pay

## 2022-10-06 ENCOUNTER — Ambulatory Visit: Payer: Medicare PPO | Admitting: Infectious Disease

## 2022-10-06 VITALS — BP 129/78 | HR 68 | Temp 97.4°F | Resp 16 | Ht 66.0 in | Wt 206.8 lb

## 2022-10-06 DIAGNOSIS — A4101 Sepsis due to Methicillin susceptible Staphylococcus aureus: Secondary | ICD-10-CM | POA: Diagnosis not present

## 2022-10-06 DIAGNOSIS — R6520 Severe sepsis without septic shock: Secondary | ICD-10-CM | POA: Diagnosis not present

## 2022-10-06 DIAGNOSIS — Z7185 Encounter for immunization safety counseling: Secondary | ICD-10-CM | POA: Diagnosis not present

## 2022-10-06 DIAGNOSIS — B9561 Methicillin susceptible Staphylococcus aureus infection as the cause of diseases classified elsewhere: Secondary | ICD-10-CM

## 2022-10-06 DIAGNOSIS — T847XXD Infection and inflammatory reaction due to other internal orthopedic prosthetic devices, implants and grafts, subsequent encounter: Secondary | ICD-10-CM

## 2022-10-06 DIAGNOSIS — R200 Anesthesia of skin: Secondary | ICD-10-CM | POA: Diagnosis not present

## 2022-10-06 DIAGNOSIS — R7881 Bacteremia: Secondary | ICD-10-CM

## 2022-10-06 DIAGNOSIS — J9601 Acute respiratory failure with hypoxia: Secondary | ICD-10-CM

## 2022-10-06 DIAGNOSIS — I4892 Unspecified atrial flutter: Secondary | ICD-10-CM

## 2022-10-06 NOTE — Progress Notes (Signed)
Subjective:  Chief complaint: Follow-up for MSSA bacteremia and deep hardware associated vertebral osteomyelitis   Patient ID: Michael Hill, male    DOB: 10-06-1951, 71 y.o.   MRN: [id number]  HPI  71 year old Caucasian man who had had final stenosis and lumbar radiculopathy status post L5-S1 decompression and fusion on September 21 then admitted October 5 with infection of his surgical site and frank bacteremia with MSSA and severe sepsis.  Blood cultures cleared he was ultimately placed on cefazolin and then rifampin was added.  As an outpatient he did not do very well on the rifampin with nausea malaise and poor appetite.  He then came back to the ER with worsening back pain and right lower extremity weakness and drainage from the wound.  MRI of the spine was performed which showed enhancing fluid collection in the soft tissues posterior lower lumbar spine was 11 x 1 x 3.3 cm in dimensions.  Patient was taken the operating room by Dr. Jake Samples who found purulence extending down to hardware.  Patient was initially switched to nafcillin but then switch back to cefazolin.  We abandoned the idea of giving him any rifampin due to his poor tolerance of this is in the outpatient world.  He ultimately was discharged to home and is receiving cefazolin at home.  He has had worsening lower extremity edema while at home and is wonder if this was related to antibiotics the only way that I can think it is related the antibiotics is that he is getting salt saline infusions with the cefazolin dose during the day.  Back pain improved dramatically.  He finished IV cefazolin and switched over to cefadroxil.  Really cannot tolerate the higher dose of 1000 g twice a day so was changed to 500 twice a day with resolution of his loose stools.  He was diagnosed now with atrial flutter by cardiology who now have him on anticoagulation and diltiazem.  He continues to have back pain but more so at night  and sometimes when he does things such as trying to lift heavy objects which he knows he should not be doing.  Prior to his last visit with me he had also noticed over t prior 2 months hat he has had increasing numbness and pain in his hands bilaterally.  He had googled this and wondered if it might be carpal tunnel syndrome.  Some of his symptoms could be consistent with this but I was concerned about the fact that he was just bacteremic with MSSA and that certainly he could have seeded the cervical spine.  I ordered it but he did not want to pursue the imaging he feels that his numbness has improved with time although is still present at different times depending on his position.  Back pain is roughly 1 to 2-3 out of 10 in severity when at its worst typically worse if he does not sleep in a correct position and also seems positional related he does not have pain at rest.  Is eager to trial off the antibiotics.  I went over the fact that Staph aureus can stick to metal and hardware and there is always going to be a risk of coming off antibiotics I am certainly willing to do so now that we have gotten through more than a year of antibiotics.--Abided his inflammatory markers are reassuring and that he can see me back in the next several months and pay very close attention to any evidence of recurrent infection  Past Medical History:  Diagnosis Date   Arthritis    Atrial flutter (HCC) 01/05/2022   Bilateral hand numbness 04/18/2022   Chronic back pain    scoliosis/stenosis/spondylosis   Herpes zoster    History of bronchitis    many yrs ago   History of elevated PSA    History of shingles    Hypercholesterolemia    Hyperlipidemia    takes Lipitor daily   Hypertension    takes Lotrel daily   Lower extremity edema 10/22/2021   Prediabetes    Severe obesity (HCC)     Past Surgical History:  Procedure Laterality Date   ABDOMINAL EXPOSURE N/A 08/05/2014   Procedure: ABDOMINAL  EXPOSURE;  Surgeon: Chuck Hint, MD;  Location: [facility];  Service: Vascular;  Laterality: N/A;   ANTERIOR LAT LUMBAR FUSION Right 08/05/2014   Procedure: Right Lumbar two-three,Lumbar three-four,Lumbar four-five Anterior lateral lumbar interbody fusion;  Surgeon: Maeola Harman, MD;  Location: [facility];  Service: Neurosurgery;  Laterality: Right;  right    ANTERIOR LUMBAR FUSION N/A 08/05/2014   Procedure: Lumbar five-Sacral-One Anterior lumbar interbody fusion with Dr. Edilia Bo for approach; Right Lumbar two-three,Lumbar three-four,Lumbar four-five Anterior lateral lumbar interbody fusion;  Surgeon: Maeola Harman, MD;  Location: [facility];  Service: Neurosurgery;  Laterality: N/A;   APPLICATION OF INTRAOPERATIVE CT SCAN N/A 08/24/2021   Procedure: APPLICATION OF INTRAOPERATIVE CT SCAN;  Surgeon: Maeola Harman, MD;  Location: [facility] OR;  Service: Neurosurgery;  Laterality: N/A;   APPLICATION OF WOUND VAC N/A 09/24/2021   Procedure: APPLICATION OF WOUND VAC;  Surgeon: Dawley, Alan Mulder, DO;  Location: [facility] OR;  Service: Neurosurgery;  Laterality: N/A;   COLONOSCOPY     EPIDURAL BLOCK INJECTION     KNEE ARTHROSCOPY Bilateral    x 4    LUMBAR FUSION  08/05/2014        DR STERN   LUMBAR PERCUTANEOUS PEDICLE SCREW 3 LEVEL N/A 08/07/2014   Procedure: Stage II Lumbar percutaneous pedicle screw placement L2-S1;  Surgeon: Maeola Harman, MD;  Location: [facility];  Service: Neurosurgery;  Laterality: N/A;  Stage II Lumbar percutaneous pedicle screw placement L2-S1   LUMBAR WOUND DEBRIDEMENT N/A 09/08/2021   Procedure: Incision and drainage of lumbar wound;  Surgeon: Maeola Harman, MD;  Location[facility] OR;  Service: Neurosurgery;  Laterality: N/A;   PROSTATE BIOPSY     ROTATOR CUFF REPAIR Right    TEE WITHOUT CARDIOVERSION N/A 09/14/2021   Procedure: TRANSESOPHAGEAL ECHOCARDIOGRAM (TEE);  Surgeon: Lewayne Bunting, MD;  Location: [facility];  Service: Cardiovascular;  Laterality: N/A;   WOUND  EXPLORATION N/A 09/24/2021   Procedure: Lumbar Wound Irrigation and Debridement.;  Surgeon: Bethann Goo, DO;  Location: [facility] OR;  Service: Neurosurgery;  Laterality: N/A;  Wound appplied    Family History  Problem Relation Age of Onset   Heart disease Father        before age 85   Hyperlipidemia Father    Hypertension Father    Hypertension Brother    Heart disease Brother        before age 71   Cancer Daughter       Social History   Socioeconomic History   Marital status: Married    Spouse name: Not on file   Number of children: Not on file   Years of education: Not on file   Highest education level: Not on file  Occupational History   Not on file  Tobacco Use  Smoking status: Never   Smokeless tobacco: Never  Vaping Use   Vaping Use: Never used  Substance and Sexual Activity   Alcohol use: No   Drug use: No   Sexual activity: Not Currently  Other Topics Concern   Not on file  Social History Narrative   Not on file   Social Determinants of Health   Financial Resource Strain: Not on file  Food Insecurity: Not on file  Transportation Needs: Not on file  Physical Activity: Not on file  Stress: Not on file  Social Connections: Not on file    No Known Allergies   Current Outpatient Medications:    acetaminophen (TYLENOL) 325 MG tablet, Take 325 mg by mouth every 6 (six) hours as needed for moderate pain, headache or mild pain., Disp: , Rfl:    apixaban (ELIQUIS) 5 MG TABS tablet, Take 1 tablet (5 mg total) by mouth 2 (two) times daily., Disp: 60 tablet, Rfl: 11   atorvastatin (LIPITOR) 20 MG tablet, Take 20 mg by mouth daily., Disp: , Rfl:    cefadroxil (DURICEF) 500 MG capsule, Take 1 capsule (500 mg total) by mouth 2 (two) times daily., Disp: 120 capsule, Rfl: 11   oxyCODONE (OXY IR/ROXICODONE) 5 MG immediate release tablet, Take 1-2 tablets (5-10 mg total) by mouth every 3 (three) hours as needed for moderate pain., Disp: 30 tablet, Rfl: 0   oxyCODONE  (OXYCONTIN) 10 mg 12 hr tablet, Take 10 mg by mouth every 12 (twelve) hours., Disp: , Rfl:    Review of Systems  Constitutional:  Negative for activity change, appetite change, chills, diaphoresis, fatigue, fever and unexpected weight change.  HENT:  Negative for congestion, rhinorrhea, sinus pressure, sneezing, sore throat and trouble swallowing.   Eyes:  Negative for photophobia and visual disturbance.  Respiratory:  Negative for cough, chest tightness, shortness of breath, wheezing and stridor.   Cardiovascular:  Negative for chest pain, palpitations and leg swelling.  Gastrointestinal:  Negative for abdominal distention, abdominal pain, anal bleeding, blood in stool, constipation, diarrhea, nausea and vomiting.  Genitourinary:  Negative for difficulty urinating, dysuria, flank pain and hematuria.  Musculoskeletal:  Positive for back pain. Negative for arthralgias, gait problem, joint swelling and myalgias.  Skin:  Negative for color change, pallor, rash and wound.  Neurological:  Positive for numbness. Negative for dizziness, tremors, weakness and light-headedness.  Hematological:  Negative for adenopathy. Does not bruise/bleed easily.  Psychiatric/Behavioral:  Negative for agitation, behavioral problems, confusion, decreased concentration, dysphoric mood and sleep disturbance.        Objective:   Physical Exam Constitutional:      Appearance: He is well-developed.  HENT:     Head: Normocephalic and atraumatic.  Eyes:     Conjunctiva/sclera: Conjunctivae normal.  Cardiovascular:     Rate and Rhythm: Normal rate and regular rhythm.  Pulmonary:     Effort: Pulmonary effort is normal. No respiratory distress.     Breath sounds: No wheezing.  Abdominal:     General: There is no distension.     Palpations: Abdomen is soft.  Musculoskeletal:        General: No tenderness. Normal range of motion.     Cervical back: Normal range of motion and neck supple.  Skin:    General: Skin is  warm and dry.     Coloration: Skin is not pale.     Findings: No erythema or rash.  Neurological:     General: No focal deficit present.  Mental Status: He is alert and oriented to person, place, and time.  Psychiatric:        Mood and Affect: Mood normal.        Behavior: Behavior normal.        Thought Content: Thought content normal.        Judgment: Judgment normal.           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  See below regarding the numbness in his hands we had concern whether that he could have infection in the C-spine certainly if he has worsening of numbness in the hand or grip he would need to be urgently evaluated with an MRI   Bilateral hand numbness and pain: He feels like this is improving he never had MRI C-spine.  We will continue to monitor.    Again if we stop his antibiotics that we are treating his hardware associated infection with and the site is also infected it will declare itself and so he needs to pay close attention any worsening of numbness or strength in his hands.  Axsain counseling recommended flu vaccine but he wanted to do this at the pharmacy  History of atrial flutter: Not on diltiazem currently has a monitor in place.

## 2022-10-07 DIAGNOSIS — I4719 Other supraventricular tachycardia: Secondary | ICD-10-CM | POA: Diagnosis not present

## 2022-10-07 DIAGNOSIS — I483 Typical atrial flutter: Secondary | ICD-10-CM | POA: Diagnosis not present

## 2022-10-07 LAB — CBC WITH DIFFERENTIAL/PLATELET
Absolute Monocytes: 383 cells/uL (ref 200–950)
Basophils Absolute: 48 cells/uL (ref 0–200)
Basophils Relative: 1.1 %
Eosinophils Absolute: 163 cells/uL (ref 15–500)
Eosinophils Relative: 3.7 %
HCT: 40.2 % (ref 38.5–50.0)
Hemoglobin: 13.3 g/dL (ref 13.2–17.1)
Lymphs Abs: 2103 cells/uL (ref 850–3900)
MCH: 30.4 pg (ref 27.0–33.0)
MCHC: 33.1 g/dL (ref 32.0–36.0)
MCV: 91.8 fL (ref 80.0–100.0)
MPV: 11.1 fL (ref 7.5–12.5)
Monocytes Relative: 8.7 %
Neutro Abs: 1703 cells/uL (ref 1500–7800)
Neutrophils Relative %: 38.7 %
Platelets: 160 10*3/uL (ref 140–400)
RBC: 4.38 10*6/uL (ref 4.20–5.80)
RDW: 12.6 % (ref 11.0–15.0)
Total Lymphocyte: 47.8 %
WBC: 4.4 10*3/uL (ref 3.8–10.8)

## 2022-10-07 LAB — BASIC METABOLIC PANEL WITH GFR
BUN/Creatinine Ratio: 28 (calc) — ABNORMAL HIGH (ref 6–22)
BUN: 26 mg/dL — ABNORMAL HIGH (ref 7–25)
CO2: 30 mmol/L (ref 20–32)
Calcium: 9.2 mg/dL (ref 8.6–10.3)
Chloride: 106 mmol/L (ref 98–110)
Creat: 0.93 mg/dL (ref 0.70–1.28)
Glucose, Bld: 128 mg/dL — ABNORMAL HIGH (ref 65–99)
Potassium: 4.8 mmol/L (ref 3.5–5.3)
Sodium: 142 mmol/L (ref 135–146)
eGFR: 88 mL/min/{1.73_m2} (ref >=60)

## 2022-10-07 LAB — SEDIMENTATION RATE: Sed Rate: 2 mm/h (ref 0–20)

## 2022-10-07 LAB — C-REACTIVE PROTEIN: CRP: 3.1 mg/L (ref <8.0)

## 2022-10-24 DIAGNOSIS — F1120 Opioid dependence, uncomplicated: Secondary | ICD-10-CM | POA: Diagnosis not present

## 2022-10-24 DIAGNOSIS — Z6832 Body mass index (BMI) 32.0-32.9, adult: Secondary | ICD-10-CM | POA: Diagnosis not present

## 2022-10-24 DIAGNOSIS — M961 Postlaminectomy syndrome, not elsewhere classified: Secondary | ICD-10-CM | POA: Diagnosis not present

## 2022-11-23 ENCOUNTER — Other Ambulatory Visit: Payer: Self-pay | Admitting: Internal Medicine

## 2022-11-25 ENCOUNTER — Other Ambulatory Visit: Payer: Self-pay | Admitting: Internal Medicine

## 2022-11-25 NOTE — Telephone Encounter (Signed)
Prescription refill request for Eliquis received. Indication:aflutter Last office visit:10/23 Scr:0.9 Age: 71 Weight:93.8  kg  Prescription refilled

## 2022-12-09 ENCOUNTER — Other Ambulatory Visit: Payer: Self-pay

## 2022-12-09 ENCOUNTER — Telehealth: Payer: Self-pay

## 2022-12-09 DIAGNOSIS — T847XXD Infection and inflammatory reaction due to other internal orthopedic prosthetic devices, implants and grafts, subsequent encounter: Secondary | ICD-10-CM

## 2022-12-09 MED ORDER — CEFADROXIL 500 MG PO CAPS: 500.0000 mg | ORAL_CAPSULE | Freq: Two times a day (BID) | ORAL | 1 refills | Status: DC

## 2022-12-09 NOTE — Telephone Encounter (Signed)
Patient requesting refill for Cefadroxil. Will run out tomorrow and feels he should remain on this RX.  Last note in November states if Sed Rate WNL ok to stop Abx. Sent request to provider and scheduled patient for 3 month follow up on 02/01/2023. Advised will call patient after I am advised and may be next week.   Call back for patient [phone]. [facility]

## 2022-12-09 NOTE — Telephone Encounter (Signed)
Patient advised refill sent will keep appt 02/01/23.

## 2022-12-27 DIAGNOSIS — M961 Postlaminectomy syndrome, not elsewhere classified: Secondary | ICD-10-CM | POA: Diagnosis not present

## 2022-12-27 DIAGNOSIS — Z6833 Body mass index (BMI) 33.0-33.9, adult: Secondary | ICD-10-CM | POA: Diagnosis not present

## 2022-12-27 DIAGNOSIS — F1120 Opioid dependence, uncomplicated: Secondary | ICD-10-CM | POA: Diagnosis not present

## 2023-01-10 ENCOUNTER — Telehealth: Payer: Self-pay | Admitting: Internal Medicine

## 2023-01-10 NOTE — Telephone Encounter (Signed)
Can hold Eliquis dose tonight. Recommend trying saline nasal spray to moisten inside of nose

## 2023-01-10 NOTE — Telephone Encounter (Signed)
Patient's wife is calling stating patient is having nose bleeds that have been off and on for a month that are becoming more regular. She reports he had one this morning that lasted over 30 mins. Please advise.

## 2023-01-10 NOTE — Telephone Encounter (Signed)
The patient has been notified of the result and verbalized understanding.  All questions (if any) were answered. Gershon Crane, LPN 03/11/8031 12:24 PM

## 2023-01-10 NOTE — Telephone Encounter (Signed)
Spoke to the patient wife Michael Hill), the patient had an nose bleed this morning it last for 30 min. She also reported the patient had a nose bleed off and on for two weeks. He is currently taking Eliquis. He is currently asymptomatic, no blood seen in stool and  in urine. Will forward to MD and anticoagulation clinic.

## 2023-01-23 IMAGING — CT CT L SPINE W/ CM
1 of 7 series · 5 of 14 positions shown, 7 images · non-contrast
Comparison: Lumbar myelogram 01/31/2019

CLINICAL DATA: Low back pain extending into the dorsum of the right
foot and anterior it tibia. Medial left knee pain.
TECHNIQUE: Contiguous axial images were obtained through the Lumbar spine after
the intrathecal infusion of infusion. Coronal and sagittal
reconstructions were obtained of the axial image sets.

[Series 3: l spine soft (person_name) · axial · 0.34mm/px · z∈[-318,-126]mm · 5 of 146 slices shown, 7 images]
[im 25/146  soft-tissue]
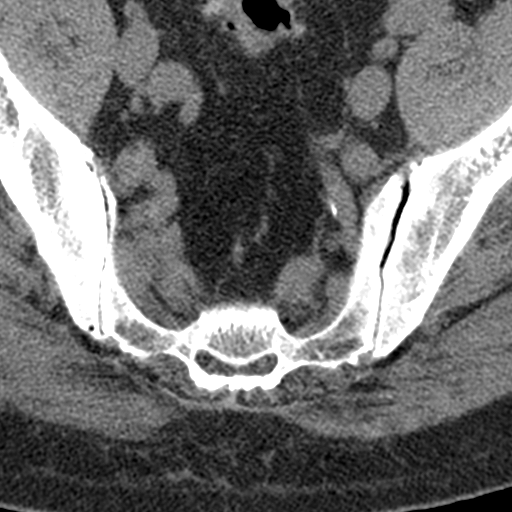
[im 25/146  bone]
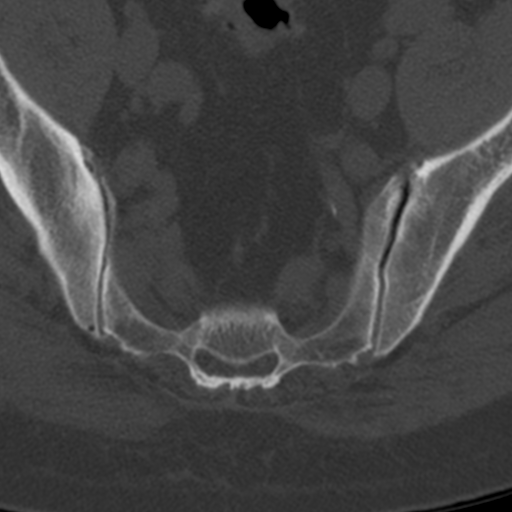
[im 49/146  bone]
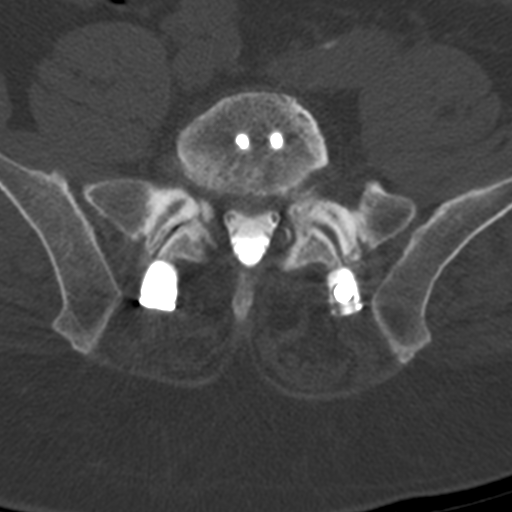
[im 73/146  bone]
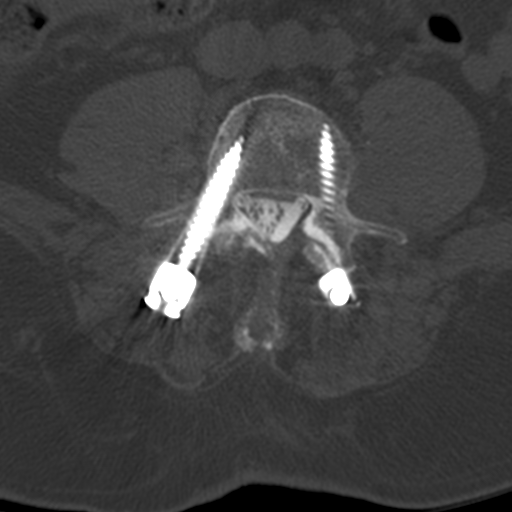
[im 97/146  bone]
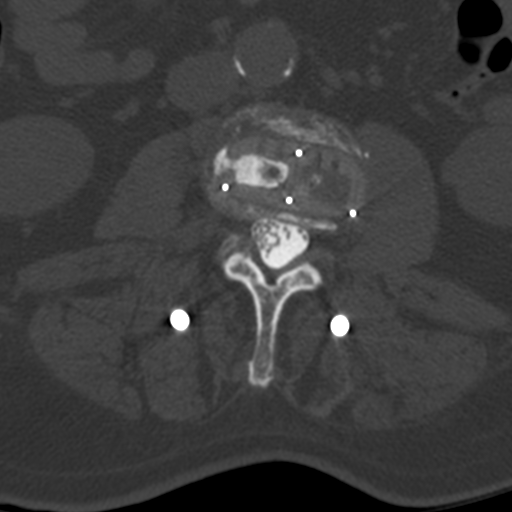
[im 121/146  soft-tissue]
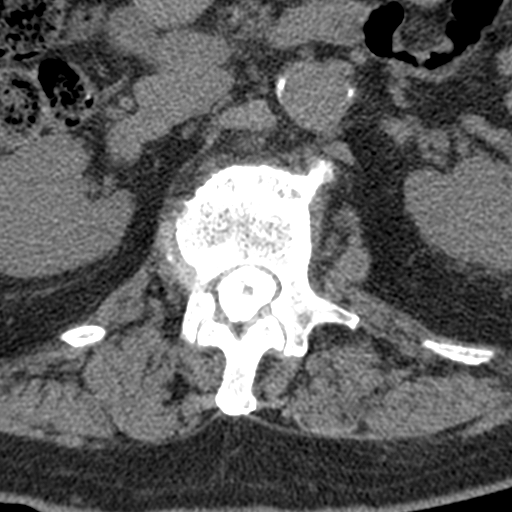
[im 121/146  bone]
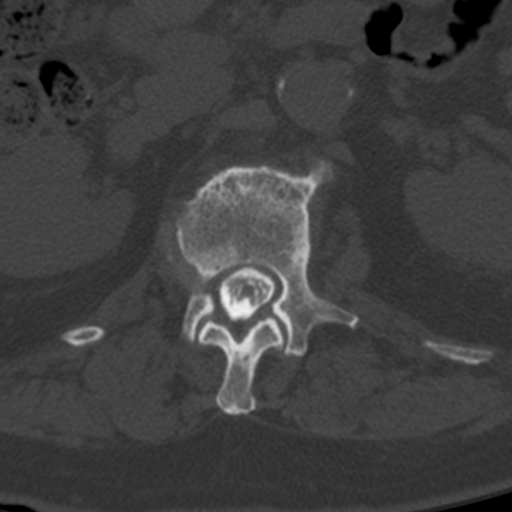

[5 of 14 positions shown; findings below may reference images not displayed]

EXAM:
LUMBAR MYELOGRAM

FLUOROSCOPY TIME:  Radiation Exposure Index (as provided by the
fluoroscopic device):

If the device does not provide the exposure index:

Fluoroscopy Time (in minutes and seconds):

Number of Acquired Images:

PROCEDURE:
After thorough discussion of risks and benefits of the procedure
including bleeding, infection, injury to nerves, blood vessels,
adjacent structures as well as headache and CSF leak, written and
oral informed consent was obtained. Consent was obtained by Dr.
Jakayla Volcy. Time out form was completed.

Patient was positioned prone on the fluoroscopy table. Local
anesthesia was provided with 1% lidocaine without epinephrine after
prepped and draped in the usual sterile fashion. Puncture was
performed at L2-3 using a 3 1/2 inch 22-gauge Kadam needle via
right paramedian approach. Using a single pass through the dura, the
needle was placed within the thecal sac, with return of clear CSF.
15 mL of Isovue E-[id number] was injected into the thecal sac, with normal
opacification of the nerve roots and cauda equina consistent with
free flow within the subarachnoid space.

I personally performed the lumbar puncture and administered the
intrathecal contrast. I also personally supervised acquisition of
the myelogram images.
FINDINGS: LUMBAR MYELOGRAM FINDINGS:

Lumbar fusion L2-S1 is stable. The left L5 nerve root is truncated.
Lumbar nerve roots otherwise fill normally on both sides. Hardware
is intact.

Progressive adjacent level disease present at L1-2 and at T12-L1
slight retrolisthesis at both levels. Retrolisthesis does not change
significantly with standing flexion, or extension.

CT LUMBAR MYELOGRAM FINDINGS:

Non rib-bearing lumbar type vertebral bodies are present. Mm
retrolisthesis L1-2 3 mm retrolisthesis at T12-L1 has increased.
Levoconvex curvature of the lumbar spine is stable, centered at L3.
Progressive right-sided endplate changes are present at L1-2. A
vacuum disc is progressed. Progressive left-sided endplate changes
and vacuum disc present at T12-L1.

Bridging bone is present across the L4 disc space no bridging bone
is present at L5-S1. There is lucency in the S1 pedicle scratched at
there is lucency surrounding the S1 pedicle screws bilaterally.
Anterior screws are stable. Posterior disc spacer is stable.

Five non rib-bearing lumbar type vertebral bodies are present.
Medullaris terminates at L1.

T12-L1: Progressive leftward disc protrusion is present. Moderate
left subarticular narrowing is present. Progressive moderate left
foraminal stenosis is present.

L1-2: Progressive rightward disc protrusion and asymmetric
right-sided facet hypertrophy is present. Progressive moderate right
subarticular and foraminal narrowing is present. Mild left foraminal
narrowing has progressed scratched at moderate left foraminal
narrowing has progressed.

L2-3: Solid anterior fusion is present. No residual recurrent
stenosis is present.

L3-4: Solid anterior fusion is present. Mild osseous foraminal
narrowing is stable, left greater than right.

L4-5: Fusion noted. Moderate left and mild right osseous foraminal
narrowing is stable.

L5-S1: A shallow central disc protrusion is present. Moderate facet
hypertrophy and spurring continues to progress. Progressive moderate
foraminal stenosis is noted bilaterally, left greater than right.
IMPRESSION: 1. Solid anterior fusion at L2-3, L3-4, and L4-5.
2. Stable osseous foraminal narrowing at L3-4 and L4-5. The most
significant is on the left at L4-5.
3. Progressive adjacent level disease at L1-2 and T12-L1 with
moderate left subarticular and foraminal stenosis at both levels.
4. No osseous fusion at the L5-S1 level with lucency about the S1
pedicle screws and progressive bilateral facet hypertrophy.
5. Progressive moderate foraminal stenosis bilaterally at L5-S1,
left greater than right secondary to the facet spurring, likely
impacting L5 nerve roots.

## 2023-01-25 DIAGNOSIS — Z6834 Body mass index (BMI) 34.0-34.9, adult: Secondary | ICD-10-CM | POA: Diagnosis not present

## 2023-01-25 DIAGNOSIS — M5416 Radiculopathy, lumbar region: Secondary | ICD-10-CM | POA: Diagnosis not present

## 2023-01-25 DIAGNOSIS — M961 Postlaminectomy syndrome, not elsewhere classified: Secondary | ICD-10-CM | POA: Diagnosis not present

## 2023-01-25 DIAGNOSIS — F1120 Opioid dependence, uncomplicated: Secondary | ICD-10-CM | POA: Diagnosis not present

## 2023-02-01 ENCOUNTER — Encounter: Payer: Self-pay | Admitting: Infectious Disease

## 2023-02-01 ENCOUNTER — Other Ambulatory Visit: Payer: Self-pay

## 2023-02-01 ENCOUNTER — Ambulatory Visit: Payer: Medicare PPO | Admitting: Infectious Disease

## 2023-02-01 VITALS — BP 125/78 | HR 92 | Temp 97.1°F | Ht 66.0 in | Wt 219.0 lb

## 2023-02-01 DIAGNOSIS — B9561 Methicillin susceptible Staphylococcus aureus infection as the cause of diseases classified elsewhere: Secondary | ICD-10-CM | POA: Diagnosis not present

## 2023-02-01 DIAGNOSIS — T847XXD Infection and inflammatory reaction due to other internal orthopedic prosthetic devices, implants and grafts, subsequent encounter: Secondary | ICD-10-CM | POA: Diagnosis not present

## 2023-02-01 DIAGNOSIS — R7881 Bacteremia: Secondary | ICD-10-CM

## 2023-02-01 DIAGNOSIS — T8149XA Infection following a procedure, other surgical site, initial encounter: Secondary | ICD-10-CM | POA: Diagnosis not present

## 2023-02-01 MED ORDER — CEFADROXIL 500 MG PO CAPS: 500.0000 mg | ORAL_CAPSULE | Freq: Two times a day (BID) | ORAL | 3 refills | Status: DC

## 2023-02-01 NOTE — Progress Notes (Signed)
Subjective:  Chief complaint: Follow-up for MSSA bacteremia and deep hardware associated vertebral osteomyelitis with worsening back pain but in the context of his opiates being titrated   Patient ID: Michael Hill, male    DOB: June 10, 1951, 72 y.o.   MRN: [id number]  HPI  72 year-old Caucasian man who had had final stenosis and lumbar radiculopathy status post L5-S1 decompression and fusion on September 21 then admitted October 5 with infection of his surgical site and frank bacteremia with MSSA and severe sepsis.  Blood cultures cleared he was ultimately placed on cefazolin and then rifampin was added.  As an outpatient he did not do very well on the rifampin with nausea malaise and poor appetite.  He then came back to the ER with worsening back pain and right lower extremity weakness and drainage from the wound.  MRI of the spine was performed which showed enhancing fluid collection in the soft tissues posterior lower lumbar spine was 11 x 1 x 3.3 cm in dimensions.  Patient was taken the operating room by Dr. Reatha Armour who found purulence extending down to hardware.  Patient was initially switched to nafcillin but then switch back to cefazolin.  We abandoned the idea of giving him any rifampin due to his poor tolerance of this is in the outpatient world.  He ultimately was discharged to home and is receiving cefazolin at home.  He has had worsening lower extremity edema while at home and is wonder if this was related to antibiotics the only way that I can think it is related the antibiotics is that he is getting salt saline infusions with the cefazolin dose during the day.  Back pain improved dramatically.  He finished IV cefazolin and switched over to cefadroxil.  Really cannot tolerate the higher dose of 1000 g twice a day so was changed to 500 twice a day with resolution of his loose stools.  He was diagnosed now with atrial flutter by cardiology who now have him on  anticoagulation and diltiazem.  He continued to have back pain but more so at night and sometimes when he does things such as trying to lift heavy objects which he knows he should not be doing.  Prior to his last visit with me he had also noticed over t prior 2 months hat he has had increasing numbness and pain in his hands bilaterally.  He had googled this and wondered if it might be carpal tunnel syndrome.  Some of his symptoms could be consistent with this but I was concerned about the fact that he was just bacteremic with MSSA and that certainly he could have seeded the cervical spine.  I ordered it but he did not want to pursue the imaging he feels that his numbness has improved with time although is still present at different times depending on his position.  Back pain is roughly 1 to 2-3 out of 10 in severity when at its worst typically worse if he does not sleep in a correct position and also seems positional related he does not have pain at rest.  He was eager to trial off the antibiotics.  I went over the fact that Staph aureus can stick to metal and hardware and there is always going to be a risk of coming off antibiotics I am certainly willing to do so now that we have gotten through more than a year of antibiotics.--Abided his inflammatory markers are reassuring and that he can see me back in the  next several months and pay very close attention to any evidence of recurrent infection   As I last saw him though his pain medication management doctor has been tapering his opiates which is confounding my ability to interpret his pain at this point in time.      Past Medical History:  Diagnosis Date   Arthritis    Atrial flutter ([place]) 01/05/2022   Bilateral hand numbness 04/18/2022   Chronic back pain    scoliosis/stenosis/spondylosis   Herpes zoster    History of bronchitis    many yrs ago   History of elevated PSA    History of shingles    Hypercholesterolemia     Hyperlipidemia    takes Lipitor daily   Hypertension    takes Lotrel daily   Lower extremity edema 10/22/2021   Prediabetes    Severe obesity ([place])     Past Surgical History:  Procedure Laterality Date   ABDOMINAL EXPOSURE N/A 08/05/2014   Procedure: ABDOMINAL EXPOSURE;  Surgeon: Angelia Mould, MD;  Location: [place] [facility];  Service: Vascular;  Laterality: N/A;   ANTERIOR LAT LUMBAR FUSION Right 08/05/2014   Procedure: Right Lumbar two-three,Lumbar three-four,Lumbar four-five Anterior lateral lumbar interbody fusion;  Surgeon: Erline Levine, MD;  Location: [place] [facility];  Service: Neurosurgery;  Laterality: Right;  right    ANTERIOR LUMBAR FUSION N/A 08/05/2014   Procedure: Lumbar five-Sacral-One Anterior lumbar interbody fusion with Dr. Scot Dock for approach; Right Lumbar two-three,Lumbar three-four,Lumbar four-five Anterior lateral lumbar interbody fusion;  Surgeon: Erline Levine, MD;  Location: [place] [facility];  Service: Neurosurgery;  Laterality: N/A;   APPLICATION OF INTRAOPERATIVE CT SCAN N/A 08/24/2021   Procedure: APPLICATION OF INTRAOPERATIVE CT SCAN;  Surgeon: Erline Levine, MD;  Location: [place];  Service: Neurosurgery;  Laterality: N/A;   APPLICATION OF WOUND VAC N/A 09/24/2021   Procedure: APPLICATION OF WOUND VAC;  Surgeon: Dawley, Theodoro Doing, DO;  Location: [place];  Service: Neurosurgery;  Laterality: N/A;   COLONOSCOPY     EPIDURAL BLOCK INJECTION     KNEE ARTHROSCOPY Bilateral    x 4    LUMBAR FUSION  08/05/2014        DR STERN   LUMBAR PERCUTANEOUS PEDICLE SCREW 3 LEVEL N/A 08/07/2014   Procedure: Stage II Lumbar percutaneous pedicle screw placement L2-S1;  Surgeon: Erline Levine, MD;  Location: [place] [facility];  Service: Neurosurgery;  Laterality: N/A;  Stage II Lumbar percutaneous pedicle screw placement L2-S1   LUMBAR WOUND DEBRIDEMENT N/A 09/08/2021   Procedure: Incision and drainage of lumbar wound;  Surgeon: Erline Levine, MD;  Location: [place];  Service: Neurosurgery;  Laterality:  N/A;   PROSTATE BIOPSY     ROTATOR CUFF REPAIR Right    TEE WITHOUT CARDIOVERSION N/A 09/14/2021   Procedure: TRANSESOPHAGEAL ECHOCARDIOGRAM (TEE);  Surgeon: Lelon Perla, MD;  Location: [place];  Service: Cardiovascular;  Laterality: N/A;   WOUND EXPLORATION N/A 09/24/2021   Procedure: Lumbar Wound Irrigation and Debridement.;  Surgeon: Karsten Ro, DO;  Location: [place];  Service: Neurosurgery;  Laterality: N/A;  Wound appplied    Family History  Problem Relation Age of Onset   Heart disease Father        before age 69   Hyperlipidemia Father    Hypertension Father    Hypertension Brother    Heart disease Brother        before age 75   Cancer Daughter       Social History   Socioeconomic  History   Marital status: Married    Spouse name: Not on file   Number of children: Not on file   Years of education: Not on file   Highest education level: Not on file  Occupational History   Not on file  Tobacco Use   Smoking status: Never   Smokeless tobacco: Never  Vaping Use   Vaping Use: Never used  Substance and Sexual Activity   Alcohol use: No   Drug use: No   Sexual activity: Not Currently  Other Topics Concern   Not on file  Social History Narrative   Not on file   Social Determinants of Health   Financial Resource Strain: Not on file  Food Insecurity: Not on file  Transportation Needs: Not on file  Physical Activity: Not on file  Stress: Not on file  Social Connections: Not on file    No Known Allergies   Current Outpatient Medications:    acetaminophen (TYLENOL) 325 MG tablet, Take 325 mg by mouth every 6 (six) hours as needed for moderate pain, headache or mild pain., Disp: , Rfl:    atorvastatin (LIPITOR) 20 MG tablet, Take 20 mg by mouth daily., Disp: , Rfl:    ELIQUIS 5 MG TABS tablet, TAKE 1 TABLET BY MOUTH TWICE A DAY, Disp: 60 tablet, Rfl: 11   oxyCODONE (OXY IR/ROXICODONE) 5 MG immediate release tablet, Take 1-2 tablets (5-10 mg total)  by mouth every 3 (three) hours as needed for moderate pain., Disp: 30 tablet, Rfl: 0   oxyCODONE (OXYCONTIN) 10 mg 12 hr tablet, Take 10 mg by mouth every 12 (twelve) hours., Disp: , Rfl:    cefadroxil (DURICEF) 500 MG capsule, Take 1 capsule (500 mg total) by mouth 2 (two) times daily., Disp: 120 capsule, Rfl: 3   Review of Systems  Constitutional:  Negative for activity change, appetite change, chills, diaphoresis, fatigue, fever and unexpected weight change.  HENT:  Negative for congestion, rhinorrhea, sinus pressure, sneezing, sore throat and trouble swallowing.   Eyes:  Negative for photophobia and visual disturbance.  Respiratory:  Negative for cough, chest tightness, shortness of breath, wheezing and stridor.   Cardiovascular:  Negative for chest pain, palpitations and leg swelling.  Gastrointestinal:  Negative for abdominal distention, abdominal pain, anal bleeding, blood in stool, constipation, diarrhea, nausea and vomiting.  Genitourinary:  Negative for difficulty urinating, dysuria, flank pain and hematuria.  Musculoskeletal:  Positive for back pain and myalgias. Negative for arthralgias, gait problem and joint swelling.  Skin:  Negative for color change, pallor, rash and wound.  Neurological:  Negative for dizziness, tremors, weakness and light-headedness.  Hematological:  Negative for adenopathy. Does not bruise/bleed easily.  Psychiatric/Behavioral:  Negative for agitation, behavioral problems, confusion, decreased concentration, dysphoric mood and sleep disturbance.        Objective:   Physical Exam Constitutional:      Appearance: He is well-developed.  HENT:     Head: Normocephalic and atraumatic.  Eyes:     Conjunctiva/sclera: Conjunctivae normal.  Cardiovascular:     Rate and Rhythm: Normal rate and regular rhythm.  Pulmonary:     Effort: Pulmonary effort is normal. No respiratory distress.     Breath sounds: No wheezing.  Abdominal:     General: There is no  distension.     Palpations: Abdomen is soft.  Musculoskeletal:        General: No tenderness. Normal range of motion.     Cervical back: Normal range of motion and neck  supple.  Skin:    General: Skin is warm and dry.     Coloration: Skin is not pale.     Findings: No erythema or rash.  Neurological:     General: No focal deficit present.     Mental Status: He is alert and oriented to person, place, and time.  Psychiatric:        Mood and Affect: Mood normal.        Behavior: Behavior normal.        Thought Content: Thought content normal.        Judgment: Judgment normal.           Assessment & Plan:   MSSA bacteremia and MSSA hardware sided lumbar wound infection with osteomyelitis:  Will check sed rate CRP BMP and CBC with differential.  If inflammatory markers are normalized still like him to stay on his cefadroxil given the worsening pain in the context of opiate titration and have him come back in 6 months time  \symptoms of infection such as fever chills myalgias malaise.  See below regarding the numbness in his hands we had concern whether that he could have infection in the C-spine certainly if he has worsening of numbness in the hand or grip he would need to be urgently evaluated with an MRI    Lateral hand pain: He was wonder if antibiotics could be causing inflammation of the bones and I really do not think that cefadroxil would be doing this.  Found some mention the package insert but this is not a common side effect from this cephalosporin  I spent 42 minutes with the patient including than 50% of the time in face to face counseling of the patient regarding the nature of MSSA bloodstream infection hardware associated infection along with review of medical records in preparation for the visit and during the visit and in coordination of his care.

## 2023-02-02 LAB — BASIC METABOLIC PANEL WITH GFR
BUN: 24 mg/dL (ref 7–25)
CO2: 28 mmol/L (ref 20–32)
Calcium: 9.3 mg/dL (ref 8.6–10.3)
Chloride: 106 mmol/L (ref 98–110)
Creat: 0.98 mg/dL (ref 0.70–1.28)
Glucose, Bld: 100 mg/dL — ABNORMAL HIGH (ref 65–99)
Potassium: 4.9 mmol/L (ref 3.5–5.3)
Sodium: 142 mmol/L (ref 135–146)
eGFR: 82 mL/min/{1.73_m2} (ref >=60)

## 2023-02-02 LAB — C-REACTIVE PROTEIN: CRP: 2.1 mg/L (ref <8.0)

## 2023-02-02 LAB — CBC WITH DIFFERENTIAL/PLATELET
Absolute Monocytes: 411 cells/uL (ref 200–950)
Basophils Absolute: 42 cells/uL (ref 0–200)
Basophils Relative: 0.8 %
Eosinophils Absolute: 182 cells/uL (ref 15–500)
Eosinophils Relative: 3.5 %
HCT: 42 % (ref 38.5–50.0)
Hemoglobin: 14 g/dL (ref 13.2–17.1)
Lymphs Abs: 1492 cells/uL (ref 850–3900)
MCH: 30 pg (ref 27.0–33.0)
MCHC: 33.3 g/dL (ref 32.0–36.0)
MCV: 89.9 fL (ref 80.0–100.0)
MPV: 11 fL (ref 7.5–12.5)
Monocytes Relative: 7.9 %
Neutro Abs: 3073 cells/uL (ref 1500–7800)
Neutrophils Relative %: 59.1 %
Platelets: 178 10*3/uL (ref 140–400)
RBC: 4.67 10*6/uL (ref 4.20–5.80)
RDW: 13.1 % (ref 11.0–15.0)
Total Lymphocyte: 28.7 %
WBC: 5.2 10*3/uL (ref 3.8–10.8)

## 2023-02-02 LAB — SEDIMENTATION RATE: Sed Rate: 6 mm/h (ref 0–20)

## 2023-02-10 ENCOUNTER — Encounter: Payer: Self-pay | Admitting: Internal Medicine

## 2023-02-13 DIAGNOSIS — F1120 Opioid dependence, uncomplicated: Secondary | ICD-10-CM | POA: Diagnosis not present

## 2023-02-13 DIAGNOSIS — Z6834 Body mass index (BMI) 34.0-34.9, adult: Secondary | ICD-10-CM | POA: Diagnosis not present

## 2023-02-28 DIAGNOSIS — I4892 Unspecified atrial flutter: Secondary | ICD-10-CM | POA: Diagnosis not present

## 2023-02-28 DIAGNOSIS — M545 Low back pain, unspecified: Secondary | ICD-10-CM | POA: Diagnosis not present

## 2023-02-28 DIAGNOSIS — Z87898 Personal history of other specified conditions: Secondary | ICD-10-CM | POA: Diagnosis not present

## 2023-02-28 DIAGNOSIS — D6869 Other thrombophilia: Secondary | ICD-10-CM | POA: Diagnosis not present

## 2023-02-28 DIAGNOSIS — M25569 Pain in unspecified knee: Secondary | ICD-10-CM | POA: Diagnosis not present

## 2023-03-16 DIAGNOSIS — Z6835 Body mass index (BMI) 35.0-35.9, adult: Secondary | ICD-10-CM | POA: Diagnosis not present

## 2023-03-16 DIAGNOSIS — M961 Postlaminectomy syndrome, not elsewhere classified: Secondary | ICD-10-CM | POA: Diagnosis not present

## 2023-03-16 DIAGNOSIS — F1120 Opioid dependence, uncomplicated: Secondary | ICD-10-CM | POA: Diagnosis not present

## 2023-03-27 DIAGNOSIS — M170 Bilateral primary osteoarthritis of knee: Secondary | ICD-10-CM | POA: Diagnosis not present

## 2023-03-27 DIAGNOSIS — M25561 Pain in right knee: Secondary | ICD-10-CM | POA: Diagnosis not present

## 2023-03-27 DIAGNOSIS — G8929 Other chronic pain: Secondary | ICD-10-CM | POA: Diagnosis not present

## 2023-03-27 DIAGNOSIS — M25562 Pain in left knee: Secondary | ICD-10-CM | POA: Diagnosis not present

## 2023-04-24 DIAGNOSIS — M25562 Pain in left knee: Secondary | ICD-10-CM | POA: Diagnosis not present

## 2023-04-24 DIAGNOSIS — G8929 Other chronic pain: Secondary | ICD-10-CM | POA: Diagnosis not present

## 2023-04-24 DIAGNOSIS — M25561 Pain in right knee: Secondary | ICD-10-CM | POA: Diagnosis not present

## 2023-04-24 DIAGNOSIS — M170 Bilateral primary osteoarthritis of knee: Secondary | ICD-10-CM | POA: Diagnosis not present

## 2023-05-16 DIAGNOSIS — Z6834 Body mass index (BMI) 34.0-34.9, adult: Secondary | ICD-10-CM | POA: Diagnosis not present

## 2023-05-16 DIAGNOSIS — F1120 Opioid dependence, uncomplicated: Secondary | ICD-10-CM | POA: Diagnosis not present

## 2023-05-16 DIAGNOSIS — M961 Postlaminectomy syndrome, not elsewhere classified: Secondary | ICD-10-CM | POA: Diagnosis not present

## 2023-05-22 DIAGNOSIS — M170 Bilateral primary osteoarthritis of knee: Secondary | ICD-10-CM | POA: Diagnosis not present

## 2023-05-22 DIAGNOSIS — G8929 Other chronic pain: Secondary | ICD-10-CM | POA: Diagnosis not present

## 2023-05-22 DIAGNOSIS — M25562 Pain in left knee: Secondary | ICD-10-CM | POA: Diagnosis not present

## 2023-05-22 DIAGNOSIS — M25561 Pain in right knee: Secondary | ICD-10-CM | POA: Diagnosis not present

## 2023-05-30 DIAGNOSIS — L570 Actinic keratosis: Secondary | ICD-10-CM | POA: Diagnosis not present

## 2023-05-30 DIAGNOSIS — L603 Nail dystrophy: Secondary | ICD-10-CM | POA: Diagnosis not present

## 2023-05-30 DIAGNOSIS — C44329 Squamous cell carcinoma of skin of other parts of face: Secondary | ICD-10-CM | POA: Diagnosis not present

## 2023-05-30 DIAGNOSIS — Z85828 Personal history of other malignant neoplasm of skin: Secondary | ICD-10-CM | POA: Diagnosis not present

## 2023-05-30 DIAGNOSIS — D485 Neoplasm of uncertain behavior of skin: Secondary | ICD-10-CM | POA: Diagnosis not present

## 2023-06-27 DIAGNOSIS — C44329 Squamous cell carcinoma of skin of other parts of face: Secondary | ICD-10-CM | POA: Diagnosis not present

## 2023-06-27 DIAGNOSIS — Z85828 Personal history of other malignant neoplasm of skin: Secondary | ICD-10-CM | POA: Diagnosis not present

## 2023-07-19 DIAGNOSIS — F1120 Opioid dependence, uncomplicated: Secondary | ICD-10-CM | POA: Diagnosis not present

## 2023-07-19 DIAGNOSIS — M961 Postlaminectomy syndrome, not elsewhere classified: Secondary | ICD-10-CM | POA: Diagnosis not present

## 2023-08-01 ENCOUNTER — Encounter: Payer: Self-pay | Admitting: Infectious Disease

## 2023-08-01 ENCOUNTER — Ambulatory Visit: Payer: Medicare PPO | Admitting: Infectious Disease

## 2023-08-01 ENCOUNTER — Other Ambulatory Visit: Payer: Self-pay

## 2023-08-01 VITALS — BP 143/88 | HR 92 | Temp 98.2°F | Resp 16

## 2023-08-01 DIAGNOSIS — A4101 Sepsis due to Methicillin susceptible Staphylococcus aureus: Secondary | ICD-10-CM

## 2023-08-01 DIAGNOSIS — T847XXD Infection and inflammatory reaction due to other internal orthopedic prosthetic devices, implants and grafts, subsequent encounter: Secondary | ICD-10-CM | POA: Diagnosis not present

## 2023-08-01 DIAGNOSIS — R6520 Severe sepsis without septic shock: Secondary | ICD-10-CM | POA: Diagnosis not present

## 2023-08-01 DIAGNOSIS — M4620 Osteomyelitis of vertebra, site unspecified: Secondary | ICD-10-CM

## 2023-08-01 DIAGNOSIS — M174 Other bilateral secondary osteoarthritis of knee: Secondary | ICD-10-CM | POA: Diagnosis not present

## 2023-08-01 DIAGNOSIS — J9601 Acute respiratory failure with hypoxia: Secondary | ICD-10-CM

## 2023-08-01 DIAGNOSIS — M1730 Unilateral post-traumatic osteoarthritis, unspecified knee: Secondary | ICD-10-CM

## 2023-08-01 DIAGNOSIS — M179 Osteoarthritis of knee, unspecified: Secondary | ICD-10-CM | POA: Insufficient documentation

## 2023-08-01 HISTORY — DX: Osteoarthritis of knee, unspecified: M17.9

## 2023-08-01 HISTORY — DX: Osteomyelitis of vertebra, site unspecified: M46.20

## 2023-08-01 MED ORDER — CEFADROXIL 500 MG PO CAPS
500.0000 mg | ORAL_CAPSULE | Freq: Two times a day (BID) | ORAL | 11 refills | Status: DC
Start: 2023-08-01 — End: 2024-08-02

## 2023-08-01 NOTE — Progress Notes (Signed)
Subjective:  Chief complaint: Follow-up for MSSA bacteremia with surgical surgical site infection and hardware involvement.      Patient ID: Michael Hill, male    DOB: 11/30/1951, 72 y.o.   MRN: [id number]  HPI  72 year-old Caucasian man who had had final stenosis and lumbar radiculopathy status post L5-S1 decompression and fusion on September 21 then admitted October 5 with infection of his surgical site and frank bacteremia with MSSA and severe sepsis.  Blood cultures cleared he was ultimately placed on cefazolin and then rifampin was added.  As an outpatient he did not do very well on the rifampin with nausea malaise and poor appetite.  He then came back to the ER with worsening back pain and right lower extremity weakness and drainage from the wound.  MRI of the spine was performed which showed enhancing fluid collection in the soft tissues posterior lower lumbar spine was 11 x 1 x 3.3 cm in dimensions.  Patient was taken the operating room by Dr. Jake Samples who found purulence extending down to hardware.  Patient was initially switched to nafcillin but then switch back to cefazolin.  We abandoned the idea of giving him any rifampin due to his poor tolerance of this is in the outpatient world.  He ultimately was discharged to home and is receiving cefazolin at home.  He has had worsening lower extremity edema while at home and is wonder if this was related to antibiotics the only way that I can think it is related the antibiotics is that he is getting salt saline infusions with the cefazolin dose during the day.  Back pain improved dramatically.  He finished IV cefazolin and switched over to cefadroxil.  Interim history:   Really cannot tolerate the higher dose of 1000 g twice a day so was changed to 500 twice a day with resolution of his loose stools.  He was diagnosed now with atrial flutter by cardiology who now have him on anticoagulation and diltiazem.  He continued  to have back pain but more so at night and sometimes when he does things such as trying to lift heavy objects which he knows he should not be doing.  Prior to his last visit with me he had also noticed over t prior 2 months hat he has had increasing numbness and pain in his hands bilaterally.  He had googled this and wondered if it might be carpal tunnel syndrome.  Some of his symptoms could be consistent with this but I was concerned about the fact that he was just bacteremic with MSSA and that certainly he could have seeded the cervical spine.  I ordered it but he did not want to pursue the imaging he feels that his numbness has improved with time although is still present at different times depending on his position.  Back pain is roughly 1 to 2-3 out of 10 in severity when at its worst typically worse if he does not sleep in a correct position and also seems positional related he does not have pain at rest.  He was eager to trial off the antibiotics.  I went over the fact that Staph aureus can stick to metal and hardware and there is always going to be a risk of coming off antibiotics I am certainly willing to do so now that we have gotten through more than a year of antibiotics.--Abided his inflammatory markers are reassuring and that he can see me back in the next several months and  pay very close attention to any evidence of recurrent infection"  Since we last saw him he has been relatively stable he is frustrated as he has developed problems with his gait also knee pain he is having a hard time doing things that he used to do is standard upkeep around his house which is mowing the lawn or or doing gardening work and things of that nature.  He used to work and exercise can he is allergy and is very frustrating for him.  Offered recommendations for physical therapy for him as I think might be beneficial.      Past Medical History:  Diagnosis Date   Arthritis    Atrial flutter ([facility])  01/05/2022   Bilateral hand numbness 04/18/2022   Chronic back pain    scoliosis/stenosis/spondylosis   Herpes zoster    History of bronchitis    many yrs ago   History of elevated PSA    History of shingles    Hypercholesterolemia    Hyperlipidemia    takes Lipitor daily   Hypertension    takes Lotrel daily   Lower extremity edema 10/22/2021   Prediabetes    Severe obesity ([facility])     Past Surgical History:  Procedure Laterality Date   ABDOMINAL EXPOSURE N/A 08/05/2014   Procedure: ABDOMINAL EXPOSURE;  Surgeon: Chuck Hint, MD;  Location: [facility];  Service: Vascular;  Laterality: N/A;   ANTERIOR LAT LUMBAR FUSION Right 08/05/2014   Procedure: Right Lumbar two-three,Lumbar three-four,Lumbar four-five Anterior lateral lumbar interbody fusion;  Surgeon: Maeola Harman, MD;  Location: [facility];  Service: Neurosurgery;  Laterality: Right;  right    ANTERIOR LUMBAR FUSION N/A 08/05/2014   Procedure: Lumbar five-Sacral-One Anterior lumbar interbody fusion with Dr. Edilia Bo for approach; Right Lumbar two-three,Lumbar three-four,Lumbar four-five Anterior lateral lumbar interbody fusion;  Surgeon: Maeola Harman, MD;  Location: [facility];  Service: Neurosurgery;  Laterality: N/A;   APPLICATION OF INTRAOPERATIVE CT SCAN N/A 08/24/2021   Procedure: APPLICATION OF INTRAOPERATIVE CT SCAN;  Surgeon: Maeola Harman, MD;  Location: [facility] OR;  Service: Neurosurgery;  Laterality: N/A;   APPLICATION OF WOUND VAC N/A 09/24/2021   Procedure: APPLICATION OF WOUND VAC;  Surgeon: Dawley, Alan Mulder, DO;  Location: [facility] OR;  Service: Neurosurgery;  Laterality: N/A;   COLONOSCOPY     EPIDURAL BLOCK INJECTION     KNEE ARTHROSCOPY Bilateral    x 4    LUMBAR FUSION  08/05/2014        DR STERN   LUMBAR PERCUTANEOUS PEDICLE SCREW 3 LEVEL N/A 08/07/2014   Procedure: Stage II Lumbar percutaneous pedicle screw placement L2-S1;  Surgeon: Maeola Harman, MD;  Location: [facility];  Service: Neurosurgery;  Laterality:  N/A;  Stage II Lumbar percutaneous pedicle screw placement L2-S1   LUMBAR WOUND DEBRIDEMENT N/A 09/08/2021   Procedure: Incision and drainage of lumbar wound;  Surgeon: Maeola Harman, MD;  Location: [facility];  Service: Neurosurgery;  Laterality: N/A;   PROSTATE BIOPSY     ROTATOR CUFF REPAIR Right    TEE WITHOUT CARDIOVERSION N/A 09/14/2021   Procedure: TRANSESOPHAGEAL ECHOCARDIOGRAM (TEE);  Surgeon: Lewayne Bunting, MD;  Location[facility];  Service: Cardiovascular;  Laterality: N/A;   WOUND EXPLORATION N/A 09/24/2021   Procedure: Lumbar Wound Irrigation and Debridement.;  Surgeon: Bethann Goo, DO;  Location: [facility] OR;  Service: Neurosurgery;  Laterality: N/A;  Wound appplied    Family History  Problem Relation Age of Onset   Heart disease Father  before age 82   Hyperlipidemia Father    Hypertension Father    Hypertension Brother    Heart disease Brother        before age 31   Cancer Daughter       Social History   Socioeconomic History   Marital status: Married    Spouse name: Not on file   Number of children: Not on file   Years of education: Not on file   Highest education level: Not on file  Occupational History   Not on file  Tobacco Use   Smoking status: Never   Smokeless tobacco: Never  Vaping Use   Vaping status: Never Used  Substance and Sexual Activity   Alcohol use: No   Drug use: No   Sexual activity: Not Currently  Other Topics Concern   Not on file  Social History Narrative   Not on file   Social Determinants of Health   Financial Resource Strain: Not on file  Food Insecurity: Not on file  Transportation Needs: Not on file  Physical Activity: Not on file  Stress: Not on file  Social Connections: Not on file    No Known Allergies   Current Outpatient Medications:    acetaminophen (TYLENOL) 325 MG tablet, Take 325 mg by mouth every 6 (six) hours as needed for moderate pain, headache or mild pain., Disp: , Rfl:    atorvastatin  (LIPITOR) 20 MG tablet, Take 20 mg by mouth daily., Disp: , Rfl:    cefadroxil (DURICEF) 500 MG capsule, Take 1 capsule (500 mg total) by mouth 2 (two) times daily., Disp: 120 capsule, Rfl: 3   ELIQUIS 5 MG TABS tablet, TAKE 1 TABLET BY MOUTH TWICE A DAY, Disp: 60 tablet, Rfl: 11   oxyCODONE (OXY IR/ROXICODONE) 5 MG immediate release tablet, Take 1-2 tablets (5-10 mg total) by mouth every 3 (three) hours as needed for moderate pain., Disp: 30 tablet, Rfl: 0   oxyCODONE (OXYCONTIN) 10 mg 12 hr tablet, Take 10 mg by mouth every 12 (twelve) hours., Disp: , Rfl:    Review of Systems  Constitutional:  Negative for activity change, appetite change, chills, diaphoresis, fatigue, fever and unexpected weight change.  HENT:  Negative for congestion, rhinorrhea, sinus pressure, sneezing, sore throat and trouble swallowing.   Eyes:  Negative for photophobia and visual disturbance.  Respiratory:  Negative for cough, chest tightness, shortness of breath, wheezing and stridor.   Cardiovascular:  Negative for chest pain, palpitations and leg swelling.  Gastrointestinal:  Negative for abdominal distention, abdominal pain, anal bleeding, blood in stool, constipation, diarrhea, nausea and vomiting.  Genitourinary:  Negative for difficulty urinating, dysuria, flank pain and hematuria.  Musculoskeletal:  Positive for arthralgias, back pain and gait problem. Negative for joint swelling and myalgias.  Skin:  Negative for color change, pallor, rash and wound.  Neurological:  Negative for dizziness, tremors, weakness and light-headedness.  Hematological:  Negative for adenopathy. Does not bruise/bleed easily.  Psychiatric/Behavioral:  Negative for agitation, behavioral problems, confusion, decreased concentration, dysphoric mood and sleep disturbance.        Objective:   Physical Exam Constitutional:      Appearance: He is well-developed.  HENT:     Head: Normocephalic and atraumatic.  Eyes:      Conjunctiva/sclera: Conjunctivae normal.  Cardiovascular:     Rate and Rhythm: Normal rate and regular rhythm.  Pulmonary:     Effort: Pulmonary effort is normal. No respiratory distress.     Breath sounds: No  wheezing.  Abdominal:     General: There is no distension.     Palpations: Abdomen is soft.  Musculoskeletal:        General: No tenderness. Normal range of motion.     Cervical back: Normal range of motion and neck supple.  Skin:    General: Skin is warm and dry.     Coloration: Skin is not pale.     Findings: No erythema or rash.  Neurological:     General: No focal deficit present.     Mental Status: He is alert and oriented to person, place, and time.  Psychiatric:        Mood and Affect: Mood normal.        Behavior: Behavior normal.        Thought Content: Thought content normal.        Judgment: Judgment normal.           Assessment & Plan:    MSSA bacteremia with lumbar wound infection osteomyelitis:  Will recheck sed rate CRP BMP and CBC with differential P  We will continue on cefadroxil and he is to follow-up with me in 6 months time.  Problems with gait and other musculoskeletal complaints and limitations of his physical activity: I have made recommendation physical therapy for him.  Referral as well.

## 2023-08-01 NOTE — Patient Instructions (Signed)
I would like you to see   Michael Hill at   [facility]  [phone]

## 2023-08-02 LAB — CBC WITH DIFFERENTIAL/PLATELET
Absolute Monocytes: 426 cells/uL (ref 200–950)
Basophils Absolute: 49 {cells}/uL (ref 0–200)
Basophils Relative: 1 %
Eosinophils Absolute: 270 {cells}/uL (ref 15–500)
Eosinophils Relative: 5.5 %
HCT: 41.8 % (ref 38.5–50.0)
Hemoglobin: 13.8 g/dL (ref 13.2–17.1)
Lymphs Abs: 1642 {cells}/uL (ref 850–3900)
MCH: 30.4 pg (ref 27.0–33.0)
MCHC: 33 g/dL (ref 32.0–36.0)
MCV: 92.1 fL (ref 80.0–100.0)
MPV: 11.2 fL (ref 7.5–12.5)
Monocytes Relative: 8.7 %
Neutro Abs: 2514 {cells}/uL (ref 1500–7800)
Neutrophils Relative %: 51.3 %
Platelets: 156 10*3/uL (ref 140–400)
RBC: 4.54 10*6/uL (ref 4.20–5.80)
RDW: 12.5 % (ref 11.0–15.0)
Total Lymphocyte: 33.5 %
WBC: 4.9 10*3/uL (ref 3.8–10.8)

## 2023-08-02 LAB — BASIC METABOLIC PANEL WITH GFR
BUN/Creatinine Ratio: 31 (calc) — ABNORMAL HIGH (ref 6–22)
BUN: 30 mg/dL — ABNORMAL HIGH (ref 7–25)
CO2: 28 mmol/L (ref 20–32)
Calcium: 8.9 mg/dL (ref 8.6–10.3)
Chloride: 106 mmol/L (ref 98–110)
Creat: 0.97 mg/dL (ref 0.70–1.28)
Glucose, Bld: 79 mg/dL (ref 65–139)
Potassium: 5 mmol/L (ref 3.5–5.3)
Sodium: 141 mmol/L (ref 135–146)
eGFR: 83 mL/min/{1.73_m2} (ref >=60)

## 2023-08-02 LAB — C-REACTIVE PROTEIN: CRP: 4.4 mg/L (ref <8.0)

## 2023-08-02 LAB — SEDIMENTATION RATE: Sed Rate: 6 mm/h (ref 0–20)

## 2023-08-29 DIAGNOSIS — D6869 Other thrombophilia: Secondary | ICD-10-CM | POA: Diagnosis not present

## 2023-08-29 DIAGNOSIS — Z0000 Encounter for general adult medical examination without abnormal findings: Secondary | ICD-10-CM | POA: Diagnosis not present

## 2023-08-29 DIAGNOSIS — I4892 Unspecified atrial flutter: Secondary | ICD-10-CM | POA: Diagnosis not present

## 2023-08-29 DIAGNOSIS — E782 Mixed hyperlipidemia: Secondary | ICD-10-CM | POA: Diagnosis not present

## 2023-08-29 DIAGNOSIS — R7303 Prediabetes: Secondary | ICD-10-CM | POA: Diagnosis not present

## 2023-08-29 DIAGNOSIS — I10 Essential (primary) hypertension: Secondary | ICD-10-CM | POA: Diagnosis not present

## 2023-08-29 DIAGNOSIS — M545 Low back pain, unspecified: Secondary | ICD-10-CM | POA: Diagnosis not present

## 2023-08-29 DIAGNOSIS — I700 Atherosclerosis of aorta: Secondary | ICD-10-CM | POA: Diagnosis not present

## 2023-09-25 NOTE — Progress Notes (Unsigned)
Cardiology Office Note:   Date:  09/26/2023  ID:  Michael Hill, DOB 05-24-1951, MRN [id number] PCP:  Tally Joe, MD  [facility] Providers Cardiologist:  Alverda Skeans, MD Referring MD: Tally Joe, MD  Chief Complaint/Reason for Referral: Cardiology follow-up ASSESSMENT:    1. SVT (supraventricular tachycardia) (HCC)   2. Typical atrial flutter (HCC)   3. Aortic atherosclerosis (HCC)   4. Coronary artery calcification seen on CAT scan   5. Hyperlipidemia LDL goal <70   6. BMI 38.0-38.9,adult     PLAN:   In order of problems listed above: SVT: Most recent monitor demonstrated occasional SVT.  He wishes to be off as many medications as possible. Atrial flutter: The patient has developed fatigue which is likely multifactorial and likely due to deconditioning, weight gain and possibly atrial arrhythmias.  EKG demonstrates atrial flutter.  The patient is on Eliquis.  He had previously expressed an interest to not be on medications.  I will refer him to EP for potential ablation procedure.  Start diltiazem 120mg .  Obtain TTE to evaluate LV function. Aortic atherosclerosis: Eliquis or aspirin as informed by above and continue statin. Coronary artery calcification: See #3 above. Hyperlipidemia: LDL last month checked by his PCP was 60 which is at goal. BMI 38:  Diet and exercise; not diabetic.          Dispo:  Return in about 6 months (around 03/26/2024).      Medication Adjustments/Labs and Tests Ordered: Current medicines are reviewed at length with the patient today.  Concerns regarding medicines are outlined above.  The following changes have been made:     Labs/tests ordered: Orders Placed This Encounter  Procedures   Ambulatory referral to Cardiac Electrophysiology   EKG 12-Lead   ECHOCARDIOGRAM COMPLETE    Medication Changes: Meds ordered this encounter  Medications   diltiazem (CARDIZEM CD) 120 MG 24 hr capsule    Sig: Take 1 capsule (120 mg total) by  mouth at bedtime.    Dispense:  90 capsule    Refill:  3    Current medicines are reviewed at length with the patient today.  The patient does not have concerns regarding medicines.  I spent 35 minutes reviewing all clinical data during and prior to this visit including all relevant imaging studies, laboratories, clinical information from other health systems, and prior notes from both Cardiology and other specialties, interviewing the patient, and conducting a complete physical examination in order to formulate a comprehensive and personalized evaluation and treatment plan.  History of Present Illness:      FOCUSED PROBLEM LIST:   Supraventricular tachycardia seen during complicated hospitalization for sepsis due to surgical site wound infection Monitor December 2022 atrial flutter and supraventricular tachycardia  Monitor 2023 with occasional SVT and PVCs Coronary artery calcification CT 2022 Aortic atherosclerosis CT lumbar spine 2017 Hyperlipidemia LP(a) 53.6 BMI 38 Atrial flutter CV 2 score of 04 November 2021: Seen after long hospitalization with sepsis and supraventricular tachycardia.  Monitor was placed which demonstrated SVT and atrial flutter.  He was started on diltiazem and Eliquis.   January 2023: Telehealth visit: The patient was doing well and continue with more physical therapy.   April 2023:    We had a televisit the last time I corresponded with him.  He was doing well.  He was started on diltiazem and Eliquis.  He is getting stronger and participating with physical therapy.  He feels relatively well.  He denies any severe bleeding  or bruising, palpitations, presyncope, or syncope.  His peripheral edema is much improved.  He is required no emergency room visits or hospitalizations.  Plan.  Continue medical therapy; consider EP referral as patient gets stronger.   October 2023: The patient is doing fairly well.  He denies any significant cardiovascular complaints.  He  has had no palpitations, paroxysmal nocturnal dyspnea, orthopnea.  He is not able to do as much as he did several years ago and he attributes this to old age.  He has had no significant bleeding or bruising on Eliquis.  He is required no hospitalizations or emergency room visits.  He feels relatively well.  Plan: Obtain monitor for atrial flutter.  Check LP(a).  October 2024: His LP(a) was not elevated.  A monitor demonstrated occasional PACs, PVCs and no atrial flutter or atrial fibrillation.  He has gained about 20 pounds from not exercising and dietary indiscretion.  He has problems with osteoarthritis of his knees and is very reluctant given the issues encountered with his back surgery to undergo any orthopedic surgeries.  He by his own admission is fatigued and deconditioned.  He denies any chest pain however.  He has had occasional palpitations but nothing regular.  He has had no severe bleeding or bruising while on Eliquis.  He has had no presyncope or syncope.  He has had some mild swelling in his lower extremities which he attributes to eating salty foods.            Current Medications: Current Meds  Medication Sig   acetaminophen (TYLENOL) 325 MG tablet Take 325 mg by mouth every 6 (six) hours as needed for moderate pain, headache or mild pain.   atorvastatin (LIPITOR) 20 MG tablet Take 20 mg by mouth daily.   cefadroxil (DURICEF) 500 MG capsule Take 1 capsule (500 mg total) by mouth 2 (two) times daily.   diltiazem (CARDIZEM CD) 120 MG 24 hr capsule Take 1 capsule (120 mg total) by mouth at bedtime.   ELIQUIS 5 MG TABS tablet TAKE 1 TABLET BY MOUTH TWICE A DAY   oxyCODONE (OXY IR/ROXICODONE) 5 MG immediate release tablet Take 1-2 tablets (5-10 mg total) by mouth every 3 (three) hours as needed for moderate pain.     Review of Systems:   Please see the history of present illness.    All other systems reviewed and are negative.     EKGs/Labs/Other Test Reviewed:   EKG: EKG  October 2023 demonstrates sinus bradycardia with first-degree AV block and nonspecific ST and T wave changes.  EKG Interpretation Date/Time:  Tuesday September 26 2023 13:47:16 EDT Ventricular Rate:  95 PR Interval:    QRS Duration:  80 QT Interval:  346 QTC Calculation: 434 R Axis:   60  Text Interpretation: Atrial flutter with variable A-V block When compared with ECG of 27-Sep-2021 08:56, Atrial flutter has replaced Sinus rhythm Confirmed by Alverda Skeans ([id number]) on 09/26/2023 1:55:50 PM         Risk Assessment/Calculations:    CHA2DS2-VASc Score = 1   This indicates a 0.6% annual risk of stroke. The patient's score is based upon: CHF History: 0 HTN History: 0 Diabetes History: 0 Stroke History: 0 Vascular Disease History: 0 Age Score: 1 Gender Score: 0          Physical Exam:   VS:  BP 130/75   Pulse 85   Ht 5\' 4"  (1.626 m)   Wt 228 lb 9.6 oz (103.7 kg)   SpO2  98%   BMI 39.24 kg/m        Wt Readings from Last 3 Encounters:  09/26/23 228 lb 9.6 oz (103.7 kg)  02/01/23 219 lb (99.3 kg)  10/06/22 206 lb 12.8 oz (93.8 kg)      GENERAL:  No apparent distress, AOx3 HEENT:  No carotid bruits, +2 carotid impulses, no scleral icterus CAR: RRR no murmurs, gallops, rubs, or thrills RES:  Clear to auscultation bilaterally ABD:  Soft, nontender, nondistended, positive bowel sounds x 4 VASC:  +2 radial pulses, +2 carotid pulses NEURO:  CN 2-12 grossly intact; motor and sensory grossly intact PSYCH:  No active depression or anxiety EXT:  No edema, ecchymosis, or cyanosis  Signed, [name], MD  09/26/2023 2:32 PM    [facility] [address] [place], [place], [place]  [address] Phone: [phone]; Fax: [phone]   Note:  This document was prepared using Dragon voice recognition software and may include unintentional dictation errors.

## 2023-09-26 ENCOUNTER — Ambulatory Visit: Payer: Medicare PPO | Attending: Internal Medicine | Admitting: Internal Medicine

## 2023-09-26 ENCOUNTER — Encounter: Payer: Self-pay | Admitting: Internal Medicine

## 2023-09-26 VITALS — BP 130/75 | HR 85 | Ht 64.0 in | Wt 228.6 lb

## 2023-09-26 DIAGNOSIS — Z6838 Body mass index (BMI) 38.0-38.9, adult: Secondary | ICD-10-CM

## 2023-09-26 DIAGNOSIS — I700 Atherosclerosis of aorta: Secondary | ICD-10-CM

## 2023-09-26 DIAGNOSIS — E785 Hyperlipidemia, unspecified: Secondary | ICD-10-CM | POA: Diagnosis not present

## 2023-09-26 DIAGNOSIS — I471 Supraventricular tachycardia, unspecified: Secondary | ICD-10-CM | POA: Diagnosis not present

## 2023-09-26 DIAGNOSIS — I483 Typical atrial flutter: Secondary | ICD-10-CM | POA: Diagnosis not present

## 2023-09-26 DIAGNOSIS — I2510 Atherosclerotic heart disease of native coronary artery without angina pectoris: Secondary | ICD-10-CM

## 2023-09-26 MED ORDER — DILTIAZEM HCL ER COATED BEADS 120 MG PO CP24: 120.0000 mg | ORAL_CAPSULE | Freq: Every day | ORAL | 3 refills | Status: DC

## 2023-09-26 NOTE — Patient Instructions (Signed)
Medication Instructions:  Your physician has recommended you make the following change in your medication:   1) START diltiazem (Cardizem) CD 120 mg daily at bedtime  *If you need a refill on your cardiac medications before your next appointment, please call your pharmacy*  Lab Work: None ordered today.  Testing/Procedures: Your physician has requested that you have an echocardiogram. Echocardiography is a painless test that uses sound waves to create images of your heart. It provides your doctor with information about the size and shape of your heart and how well your heart's chambers and valves are working. This procedure takes approximately one hour. There are no restrictions for this procedure. Please do NOT wear cologne, perfume, aftershave, or lotions (deodorant is allowed). Please arrive 15 minutes prior to your appointment time.  Follow-Up: At [facility], you and your health needs are our priority.  As part of our continuing mission to provide you with exceptional heart care, we have created designated Provider Care Teams.  These Care Teams include your primary Cardiologist (physician) and Advanced Practice Providers (APPs -  Physician Assistants and Nurse Practitioners) who all work together to provide you with the care you need, when you need it.  Your next appointment:   6 month(s)  The format for your next appointment:   In Person  Provider:   Jari Favre, PA-C, Ronie Spies, PA-C, Robin Searing, NP, Jacolyn Reedy, PA-C, Eligha Bridegroom, NP, Tereso Newcomer, PA-C, or Perlie Gold, PA-C    Other Instructions You have been referred to our electrophysiologist (here at our [facility] office), Dr. Nobie Putnam, to discuss atrial flutter ablation. A scheduler will call you to schedule an appointment with Dr. Jimmey Ralph.

## 2023-09-28 DIAGNOSIS — R946 Abnormal results of thyroid function studies: Secondary | ICD-10-CM | POA: Diagnosis not present

## 2023-09-28 DIAGNOSIS — R7989 Other specified abnormal findings of blood chemistry: Secondary | ICD-10-CM | POA: Diagnosis not present

## 2023-10-11 DIAGNOSIS — F1120 Opioid dependence, uncomplicated: Secondary | ICD-10-CM | POA: Diagnosis not present

## 2023-10-11 DIAGNOSIS — M961 Postlaminectomy syndrome, not elsewhere classified: Secondary | ICD-10-CM | POA: Diagnosis not present

## 2023-10-25 ENCOUNTER — Ambulatory Visit (HOSPITAL_COMMUNITY): Payer: Medicare PPO | Attending: Internal Medicine

## 2023-10-25 DIAGNOSIS — I483 Typical atrial flutter: Secondary | ICD-10-CM | POA: Insufficient documentation

## 2023-10-25 LAB — ECHOCARDIOGRAM COMPLETE
MV M vel: 3.87 m/s
MV Peak grad: 59.9 mm[Hg]
S' Lateral: 3.9 cm

## 2023-11-06 ENCOUNTER — Ambulatory Visit: Payer: Medicare PPO | Attending: Cardiology | Admitting: Cardiology

## 2023-11-06 ENCOUNTER — Encounter: Payer: Self-pay | Admitting: Cardiology

## 2023-11-06 VITALS — BP 132/72 | HR 91 | Ht 64.0 in | Wt 236.0 lb

## 2023-11-06 DIAGNOSIS — I484 Atypical atrial flutter: Secondary | ICD-10-CM | POA: Diagnosis not present

## 2023-11-06 DIAGNOSIS — D6869 Other thrombophilia: Secondary | ICD-10-CM

## 2023-11-06 DIAGNOSIS — I4892 Unspecified atrial flutter: Secondary | ICD-10-CM | POA: Diagnosis not present

## 2023-11-06 DIAGNOSIS — I471 Supraventricular tachycardia, unspecified: Secondary | ICD-10-CM | POA: Diagnosis not present

## 2023-11-06 NOTE — Progress Notes (Signed)
Electrophysiology Office Note:   Date:  11/06/2023  ID:  Michael Hill, DOB 1951/07/25, MRN [id number]  Primary Cardiologist: [name], MD Electrophysiologist: Nobie Putnam, MD      History of Present Illness:   Michael Hill is a 72 y.o. male with h/o SVT, aortic atherosclerosis, coronary artery calcification, hyperlipidemia, spinal stenosis and lumbar radiculopathy status post L5-S1 decompression and fusion on August 25, 2021 c/b infection of his surgical site and MSSA bacteremia, and atrial flutter who is being seen today for evaluation of atrial flutter at the request of Dr. Lynnette Caffey.   Patient first developed atrial flutter after back surgery in 2023. He saw Dr. Lynnette Caffey in October of 2024 for worsening fatigue. He was found to be in atrial flutter. He was started on diltiazem. He reports feeling better since restarting this medication. Diltiazem had previously been discontinued a year ago after [facility] monitor showed predominant underlying rhythm was sinus. Given his slow recovery from back surgery which was complicated by infection, it has been difficult for patient to assess how symptomatic he has been in atrial flutter.  He has exertional symptoms at baseline.  Currently, he has no new or acute complaints.   Review of systems complete and found to be negative unless listed in HPI.   EP Information / Studies Reviewed:    EKG is ordered today. Personal review as below.  EKG Interpretation Date/Time:  Monday November 06 2023 14:50:20 EST Ventricular Rate:  91 PR Interval:    QRS Duration:  78 QT Interval:  356 QTC Calculation: 437 R Axis:   83  Text Interpretation: Atrial flutter with variable A-V block with premature ventricular or aberrantly conducted complexes When compared with ECG of 26-Sep-2023 13:47,PVCs now present. Confirmed by Nobie Putnam [id number]) on 11/06/2023 3:43:01 PM   EKG 09/26/23:    Echo 10/25/23:   1. Significant beat to beat variability due to  underlying atrial  arrhythmia.   2. Left ventricular ejection fraction, by estimation, is 55%. The left  ventricle has low normal function. The left ventricle has no regional wall  motion abnormalities. There is mild concentric left ventricular  hypertrophy.   3. Right ventricular systolic function is normal. The right ventricular  size is mildly enlarged.   4. The mitral valve is normal in structure. Trivial mitral valve  regurgitation. No evidence of mitral stenosis.   5. The aortic valve is normal in structure. Aortic valve regurgitation is  not visualized. No aortic stenosis is present.   6. Aortic dilatation noted. There is dilatation of the ascending aorta,  measuring 41 mm.   7. The inferior vena cava is normal in size with greater than 50%  respiratory variability, suggesting right atrial pressure of 3 mmHg.   8. Left atrial size was moderately dilated.   9. Right atrial size was mild to moderately dilated.   Zio 09/2022:   Risk Assessment/Calculations:    CHA2DS2-VASc Score = 1   This indicates a 0.6% annual risk of stroke. The patient's score is based upon: CHF History: 0 HTN History: 0 Diabetes History: 0 Stroke History: 0 Vascular Disease History: 0 Age Score: 1 Gender Score: 0              Physical Exam:   VS:  BP 132/72   Pulse 91   Ht 5\' 4"  (1.626 m)   Wt 236 lb (107 kg)   SpO2 93%   BMI 40.51 kg/m    Wt Readings from Last 3 Encounters:  11/06/23 236 lb (107 kg)  09/26/23 228 lb 9.6 oz (103.7 kg)  02/01/23 219 lb (99.3 kg)     GEN: Well nourished, well developed in no acute distress NECK: No JVD. CARDIAC: Normal rate, regular rhythm. RESPIRATORY:  Clear to auscultation without rales, wheezing or rhonchi  ABDOMEN: Soft, non-tender, non-distended EXTREMITIES: 1+ edema; No deformity   ASSESSMENT AND PLAN:   Michael Hill is a 72 y.o. male with h/o SVT, aortic atherosclerosis, coronary artery calcification, hyperlipidemia, spinal stenosis and  lumbar radiculopathy status post L5-S1 decompression and fusion on August 25, 2022 c/b infection of his surgical site and MSSA bacteremia, and atrial flutter who is being seen today for evaluation of atrial flutter at the request of Dr. Lynnette Caffey.   #. Atrial flutter: Unclear onset/duration.  He is unsure of the severity of his symptoms, given that his activity level has declined since his back surgery which coincides with initial episode of atrial flutter.  He did see Dr. Lynnette Caffey in October for worsening fatigue and was found to be in atrial flutter at that time so it is possible that is playing a role in his symptoms, although he reports things are much better since starting diltiazem.  He remains in atrial flutter today, some possibility this could be coarse fibrillation on ECG.  -I discussed treatment options with the patient, including cardioversion, ablation, or changing medications.  I recommended that we try cardioversion to see if his symptoms are truly better in normal rhythm and repeat Zio monitor to see if patient has atrial fibrillation or SVT that could be triggering atrial flutter.  Patient would like to hold off on cardioversion for now.  He will continue diltiazem and Eliquis for now and we will readdress in 3 months.  #. 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  Follow up with Dr. Jimmey Ralph  in 3 months.    Total time of encounter: 64 minutes total time of encounter, including chart review, face-to-face patient care, coordination of care and counseling regarding high complexity medical decision making.  Signed, Nobie Putnam, MD

## 2023-11-06 NOTE — Patient Instructions (Addendum)
Medication Instructions:  Your physician recommends that you continue on your current medications as directed. Please refer to the Current Medication list given to you today.  *If you need a refill on your cardiac medications before your next appointment, please call your pharmacy*  Lab Work: None ordered.  If you have labs (blood work) drawn today and your tests are completely normal, you will receive your results only by: MyChart Message (if you have MyChart) OR A paper copy in the mail If you have any lab test that is abnormal or we need to change your treatment, we will call you to review the results.  Testing/Procedures: None ordered.  Follow-Up: At [facility], you and your health needs are our priority.  As part of our continuing mission to provide you with exceptional heart care, we have created designated Provider Care Teams.  These Care Teams include your primary Cardiologist (physician) and Advanced Practice Providers (APPs -  Physician Assistants and Nurse Practitioners) who all work together to provide you with the care you need, when you need it.  Your next appointment:   3 months  The format for your next appointment:   In Person  Provider:   Nobie Putnam, MD{or one of the following Advanced Practice Providers on your designated Care Team:   Francis Dowse, [place] Casimiro Needle "Mardelle Matte" [place], [place] Earnest Rosier, NP   Important Information About Sugar

## 2023-12-01 ENCOUNTER — Other Ambulatory Visit: Payer: Self-pay | Admitting: Internal Medicine

## 2023-12-01 DIAGNOSIS — I4892 Unspecified atrial flutter: Secondary | ICD-10-CM

## 2023-12-01 NOTE — Telephone Encounter (Signed)
Prescription refill request for Eliquis received. Indication: Aflutter Last office visit: 11/06/23 Jimmey Ralph)  Scr: 0.97 (08/01/23)  Age: 72 Weight: 107kg  Appropriate dose. Refill sent.

## 2023-12-27 DIAGNOSIS — M961 Postlaminectomy syndrome, not elsewhere classified: Secondary | ICD-10-CM | POA: Diagnosis not present

## 2023-12-27 DIAGNOSIS — F1120 Opioid dependence, uncomplicated: Secondary | ICD-10-CM | POA: Diagnosis not present

## 2024-01-08 ENCOUNTER — Other Ambulatory Visit: Payer: Self-pay | Admitting: Infectious Disease

## 2024-01-08 DIAGNOSIS — T847XXD Infection and inflammatory reaction due to other internal orthopedic prosthetic devices, implants and grafts, subsequent encounter: Secondary | ICD-10-CM

## 2024-01-09 ENCOUNTER — Other Ambulatory Visit: Payer: Self-pay | Admitting: Family Medicine

## 2024-01-09 DIAGNOSIS — R600 Localized edema: Secondary | ICD-10-CM

## 2024-01-09 DIAGNOSIS — K4090 Unilateral inguinal hernia, without obstruction or gangrene, not specified as recurrent: Secondary | ICD-10-CM | POA: Diagnosis not present

## 2024-01-09 DIAGNOSIS — T847XXD Infection and inflammatory reaction due to other internal orthopedic prosthetic devices, implants and grafts, subsequent encounter: Secondary | ICD-10-CM | POA: Diagnosis not present

## 2024-01-09 DIAGNOSIS — I4892 Unspecified atrial flutter: Secondary | ICD-10-CM | POA: Diagnosis not present

## 2024-01-10 ENCOUNTER — Ambulatory Visit
Admission: RE | Admit: 2024-01-10 | Discharge: 2024-01-10 | Disposition: A | Discharge status: Home / Self Care | Payer: Medicare PPO | Source: Ambulatory Visit | Attending: Family Medicine | Admitting: Family Medicine

## 2024-01-10 DIAGNOSIS — R600 Localized edema: Secondary | ICD-10-CM | POA: Diagnosis not present

## 2024-01-29 NOTE — Progress Notes (Unsigned)
 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   MRN: [id number]  HPI  73 year-old Caucasian man who had had final stenosis and lumbar radiculopathy status post L5-S1 decompression and fusion on September 21 then admitted October 5 with infection of his surgical site and frank bacteremia with MSSA and severe sepsis.  Interim history:  Blood cultures cleared he was ultimately placed on cefazolin and then rifampin was added.  As an outpatient he did not do very well on the rifampin with nausea malaise and poor appetite.  He then came back to the ER with worsening back pain and right lower extremity weakness and drainage from the wound.  MRI of the spine was performed which showed enhancing fluid collection in the soft tissues posterior lower lumbar spine was 11 x 1 x 3.3 cm in dimensions.  Patient was taken the operating room by Dr. Jake Samples who found purulence extending down to hardware.  Patient was initially switched to nafcillin but then switch back to cefazolin.  We abandoned the idea of giving him any rifampin due to his poor tolerance of this is in the outpatient world.  He ultimately was discharged to home and is receiving cefazolin at home.  He has had worsening lower extremity edema while at home and is wonder if this was related to antibiotics the only way that I can think it is related the antibiotics is that he is getting salt saline infusions with the cefazolin dose during the day.  Back pain improved dramatically.  He finished IV cefazolin and switched over to cefadroxil.  Interim history:   Really cannot tolerate the higher dose of 1000 g twice a day so was changed to 500 twice a day with resolution of his loose stools.  He was diagnosed now with atrial flutter by cardiology who now have him on anticoagulation and diltiazem.  He continued to have back pain but  more so at night and sometimes when he does things such as trying to lift heavy objects which he knows he should not be doing.  Prior to his last visit with me he had also noticed over t prior 2 months hat he has had increasing numbness and pain in his hands bilaterally.  He had googled this and wondered if it might be carpal tunnel syndrome.  Some of his symptoms could be consistent with this but I was concerned about the fact that he was just bacteremic with MSSA and that certainly he could have seeded the cervical spine.  I ordered it but he did not want to pursue the imaging he feels that his numbness has improved with time although is still present at different times depending on his position.  Back pain is roughly 1 to 2-3 out of 10 in severity when at its worst typically worse if he does not sleep in a correct position and also seems positional related he does not have pain at rest.  He was eager to trial off the antibiotics.  I went over the fact that Staph aureus can stick to metal and hardware and there is always going to be a risk of coming off antibiotics I am certainly willing to do so now that we have gotten through more than a year of antibiotics.--Abided his inflammatory markers are reassuring and that he can see me back in the next several months and pay very close attention to  any evidence of recurrent infection"  Since we last saw him he has been relatively stable he is frustrated as he has developed problems with his gait also knee pain he is having a hard time doing things that he used to do is standard upkeep around his house which is mowing the lawn or or doing gardening work and things of that nature.  He used to work and exercise can he is allergy and is very frustrating for him.  Offered recommendations for physical therapy for him as I think might be beneficial."  Discussed the use of AI scribe software for clinical note transcription with the patient, who gave verbal  consent to proceed.  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 The patient reports that the infections have been managed well with the antibiotics, but expresses concern about the potential for bacterial resistance. The patient also expresses worry about the possibility of the infection being endless and requiring lifelong antibiotics.  In addition to the infection, the patient reports experiencing back pain, which has become secondary to knee pain. The patient attributes the worsening knee pain to episodes of sepsis and a pre-existing arthritic condition. The patient also mentions having an inguinal hernia since childhood, which has recently become more bothersome.  The patient is considering coming off antibiotics, but expresses concern about the potential for the infection to return. The patient is also contemplating surgery for the hernia and possibly knee replacement, but is worried about the risk of infection due to the history of hardware-associated infections.        Past Medical History:  Diagnosis Date   Arthritis    Atrial flutter (HCC) 01/05/2022   Bilateral hand numbness 04/18/2022   Chronic back pain    scoliosis/stenosis/spondylosis   Herpes zoster    History of bronchitis    many yrs ago   History of elevated PSA    History of shingles    Hypercholesterolemia    Hyperlipidemia    takes Lipitor daily   Hypertension    takes Lotrel daily   Lower extremity edema 10/22/2021   Osteoarthritis, knee 08/01/2023   Prediabetes    Severe obesity (HCC)    Vertebral osteomyelitis (HCC) 08/01/2023    Past Surgical History:  Procedure Laterality Date   ABDOMINAL EXPOSURE N/A 08/05/2014   Procedure: ABDOMINAL EXPOSURE;  Surgeon: Chuck Hint, MD;  Location: [facility];  Service: Vascular;  Laterality: N/A;   ANTERIOR LAT LUMBAR FUSION Right 08/05/2014   Procedure:  Right Lumbar two-three,Lumbar three-four,Lumbar four-five Anterior lateral lumbar interbody fusion;  Surgeon: Maeola Harman, MD;  Location: [facility];  Service: Neurosurgery;  Laterality: Right;  right    ANTERIOR LUMBAR FUSION N/A 08/05/2014   Procedure: Lumbar five-Sacral-One Anterior lumbar interbody fusion with Dr. Edilia Bo for approach; Right Lumbar two-three,Lumbar three-four,Lumbar four-five Anterior lateral lumbar interbody fusion;  Surgeon: Maeola Harman, MD;  Location: [facility];  Service: Neurosurgery;  Laterality: N/A;   APPLICATION OF INTRAOPERATIVE CT SCAN N/A 08/24/2021   Procedure: APPLICATION OF INTRAOPERATIVE CT SCAN;  Surgeon: Maeola Harman, MD;  Location: [facility];  Service: Neurosurgery;  Laterality: N/A;   APPLICATION OF WOUND VAC N/A 09/24/2021   Procedure: APPLICATION OF WOUND VAC;  Surgeon: Dawley, Alan Mulder, DO;  Location: [facility] OR;  Service: Neurosurgery;  Laterality: N/A;   COLONOSCOPY     EPIDURAL BLOCK INJECTION     KNEE ARTHROSCOPY  Bilateral    x 4    LUMBAR FUSION  08/05/2014        DR STERN   LUMBAR PERCUTANEOUS PEDICLE SCREW 3 LEVEL N/A 08/07/2014   Procedure: Stage II Lumbar percutaneous pedicle screw placement L2-S1;  Surgeon: Maeola Harman, MD;  Location: [facility];  Service: Neurosurgery;  Laterality: N/A;  Stage II Lumbar percutaneous pedicle screw placement L2-S1   LUMBAR WOUND DEBRIDEMENT N/A 09/08/2021   Procedure: Incision and drainage of lumbar wound;  Surgeon: Maeola Harman, MD;  Location: [facility] OR;  Service: Neurosurgery;  Laterality: N/A;   PROSTATE BIOPSY     ROTATOR CUFF REPAIR Right    TEE WITHOUT CARDIOVERSION N/A 09/14/2021   Procedure: TRANSESOPHAGEAL ECHOCARDIOGRAM (TEE);  Surgeon: Lewayne Bunting, MD;  Location: [facility] ENDOSCOPY;  Service: Cardiovascular;  Laterality: N/A;   WOUND EXPLORATION N/A 09/24/2021   Procedure: Lumbar Wound Irrigation and Debridement.;  Surgeon: Bethann Goo, DO;  Location: [facility] OR;  Service: Neurosurgery;  Laterality: N/A;   Wound appplied    Family History  Problem Relation Age of Onset   Heart disease Father        before age 68   Hyperlipidemia Father    Hypertension Father    Hypertension Brother    Heart disease Brother        before age 57   Cancer Daughter       Social History   Socioeconomic History   Marital status: Married    Spouse name: Not on file   Number of children: Not on file   Years of education: Not on file   Highest education level: Not on file  Occupational History   Not on file  Tobacco Use   Smoking status: Never   Smokeless tobacco: Never  Vaping Use   Vaping status: Never Used  Substance and Sexual Activity   Alcohol use: No   Drug use: No   Sexual activity: Not Currently  Other Topics Concern   Not on file  Social History Narrative   Not on file   Social Drivers of Health   Financial Resource Strain: Not on file  Food Insecurity: Not on file  Transportation Needs: Not on file  Physical Activity: Not on file  Stress: Not on file  Social Connections: Not on file    No Known Allergies   Current Outpatient Medications:    acetaminophen (TYLENOL) 325 MG tablet, Take 325 mg by mouth every 6 (six) hours as needed for moderate pain, headache or mild pain., Disp: , Rfl:    apixaban (ELIQUIS) 5 MG TABS tablet, TAKE 1 TABLET BY MOUTH TWICE A DAY, Disp: 60 tablet, Rfl: 5   atorvastatin (LIPITOR) 20 MG tablet, Take 20 mg by mouth daily., Disp: , Rfl:    cefadroxil (DURICEF) 500 MG capsule, Take 1 capsule (500 mg total) by mouth 2 (two) times daily., Disp: 60 capsule, Rfl: 11   diltiazem (CARDIZEM CD) 120 MG 24 hr capsule, Take 1 capsule (120 mg total) by mouth at bedtime., Disp: 90 capsule, Rfl: 3   oxyCODONE (OXY IR/ROXICODONE) 5 MG immediate release tablet, Take 1-2 tablets (5-10 mg total) by mouth every 3 (three) hours as needed for moderate pain., Disp: 30 tablet, Rfl: 0   Review of Systems  Constitutional:  Negative for activity change, appetite change,  chills, diaphoresis, fatigue, fever and unexpected weight change.  HENT:  Negative for congestion, rhinorrhea, sinus pressure, sneezing, sore throat and trouble swallowing.   Eyes:  Negative for  photophobia and visual disturbance.  Respiratory:  Negative for cough, chest tightness, shortness of breath, wheezing and stridor.   Cardiovascular:  Negative for chest pain, palpitations and leg swelling.  Gastrointestinal:  Negative for abdominal distention, abdominal pain, anal bleeding, blood in stool, constipation, diarrhea, nausea and vomiting.  Genitourinary:  Negative for difficulty urinating, dysuria, flank pain and hematuria.  Musculoskeletal:  Positive for arthralgias and joint swelling. Negative for back pain, gait problem and myalgias.  Skin:  Positive for rash. Negative for color change, pallor and wound.  Neurological:  Negative for dizziness, tremors, weakness and light-headedness.  Hematological:  Negative for adenopathy. Does not bruise/bleed easily.  Psychiatric/Behavioral:  Negative for agitation, behavioral problems, confusion, decreased concentration, dysphoric mood and sleep disturbance.        Objective:   Physical Exam Constitutional:      Appearance: He is well-developed.  HENT:     Head: Normocephalic and atraumatic.  Eyes:     Conjunctiva/sclera: Conjunctivae normal.  Cardiovascular:     Rate and Rhythm: Normal rate and regular rhythm.  Pulmonary:     Effort: Pulmonary effort is normal. No respiratory distress.     Breath sounds: No wheezing.  Abdominal:     General: There is no distension.     Palpations: Abdomen is soft.  Musculoskeletal:        General: No tenderness. Normal range of motion.     Cervical back: Normal range of motion and neck supple.  Skin:    General: Skin is warm and dry.     Coloration: Skin is not pale.     Findings: No erythema or rash.  Neurological:     General: No focal deficit present.     Mental Status: He is alert and oriented  to person, place, and time.  Psychiatric:        Mood and Affect: Mood normal.        Behavior: Behavior normal.        Thought Content: Thought content normal.        Judgment: Judgment normal.    Face with dry skin, erythema apparently a chronic issue he is dealing with         Assessment & Plan:    Assessment and Plan    MSSA bacteremia and Hardware-associated vertebral diskitis, osteomyelitis On Cefadroxil 500mg  BID for 2.5 years due to previous infection. Discussed the possibility of bacterial resistance and the potential for lifelong antibiotic therapy. Also discussed the possibility of discontinuing antibiotics to assess for recurrence of infection. -Continue Cefadroxil 500mg  BID. -Check labs today to assess inflammatory markers. -Consider trial off antibiotics in 6 months, with close monitoring for recurrence of infection.  Knee Arthritis Severe, bone-on-bone arthritis causing significant pain and functional impairment. Discussed the potential for joint replacement surgery and the associated risks, including infection. -Continue current pain management strategies. -Consider joint replacement surgery based on functional impairment and quality of life. - in terms of risk of his infection from spine spreading I would NOT be terribly worried that this would happen IF he was still ON antibiotics  Inguinal Hernia Longstanding hernia, now considering surgical repair. Discussed the potential risks, including infection, but deemed low risk due to location and ongoing antibiotic therapy. -Consider surgical repair of hernia based on symptoms and quality of life.  Dry Skin Noted dry skin on face and arms. Patient reluctant to use lotion. -Encourage use of moisturizing lotion for dry skin.     Rash: apparently is ongoing issue possibly  related to his dry skin and another dermatological condition

## 2024-01-30 ENCOUNTER — Other Ambulatory Visit: Payer: Self-pay

## 2024-01-30 ENCOUNTER — Encounter: Payer: Self-pay | Admitting: Infectious Disease

## 2024-01-30 ENCOUNTER — Ambulatory Visit: Payer: Medicare PPO | Admitting: Infectious Disease

## 2024-01-30 VITALS — BP 154/97 | HR 96 | Temp 97.7°F

## 2024-01-30 DIAGNOSIS — J9601 Acute respiratory failure with hypoxia: Secondary | ICD-10-CM | POA: Diagnosis not present

## 2024-01-30 DIAGNOSIS — R21 Rash and other nonspecific skin eruption: Secondary | ICD-10-CM

## 2024-01-30 DIAGNOSIS — K4091 Unilateral inguinal hernia, without obstruction or gangrene, recurrent: Secondary | ICD-10-CM

## 2024-01-30 DIAGNOSIS — M170 Bilateral primary osteoarthritis of knee: Secondary | ICD-10-CM

## 2024-01-30 DIAGNOSIS — T847XXD Infection and inflammatory reaction due to other internal orthopedic prosthetic devices, implants and grafts, subsequent encounter: Secondary | ICD-10-CM | POA: Diagnosis not present

## 2024-01-30 DIAGNOSIS — L853 Xerosis cutis: Secondary | ICD-10-CM

## 2024-01-30 DIAGNOSIS — R6520 Severe sepsis without septic shock: Secondary | ICD-10-CM | POA: Diagnosis not present

## 2024-01-30 DIAGNOSIS — M4620 Osteomyelitis of vertebra, site unspecified: Secondary | ICD-10-CM | POA: Diagnosis not present

## 2024-01-30 DIAGNOSIS — A4101 Sepsis due to Methicillin susceptible Staphylococcus aureus: Secondary | ICD-10-CM | POA: Diagnosis not present

## 2024-02-01 LAB — CBC WITH DIFFERENTIAL/PLATELET
Absolute Lymphocytes: 1545 {cells}/uL (ref 850–3900)
Absolute Monocytes: 440 {cells}/uL (ref 200–950)
Basophils Absolute: 40 {cells}/uL (ref 0–200)
Basophils Relative: 0.8 %
Eosinophils Absolute: 110 {cells}/uL (ref 15–500)
Eosinophils Relative: 2.2 %
HCT: 39 % (ref 38.5–50.0)
Hemoglobin: 13.4 g/dL (ref 13.2–17.1)
MCH: 31.2 pg (ref 27.0–33.0)
MCHC: 34.4 g/dL (ref 32.0–36.0)
MCV: 90.9 fL (ref 80.0–100.0)
MPV: 11.5 fL (ref 7.5–12.5)
Monocytes Relative: 8.8 %
Neutro Abs: 2865 {cells}/uL (ref 1500–7800)
Neutrophils Relative %: 57.3 %
Platelets: 154 10*3/uL (ref 140–400)
RBC: 4.29 10*6/uL (ref 4.20–5.80)
RDW: 12.8 % (ref 11.0–15.0)
Total Lymphocyte: 30.9 %
WBC: 5 10*3/uL (ref 3.8–10.8)

## 2024-02-01 LAB — BASIC METABOLIC PANEL WITH GFR
BUN: 24 mg/dL (ref 7–25)
CO2: 28 mmol/L (ref 20–32)
Calcium: 9 mg/dL (ref 8.6–10.3)
Chloride: 105 mmol/L (ref 98–110)
Creat: 0.85 mg/dL (ref 0.70–1.28)
Glucose, Bld: 107 mg/dL — ABNORMAL HIGH (ref 65–99)
Potassium: 4.7 mmol/L (ref 3.5–5.3)
Sodium: 140 mmol/L (ref 135–146)
eGFR: 92 mL/min/{1.73_m2} (ref >=60)

## 2024-02-01 LAB — C-REACTIVE PROTEIN: CRP: <3 mg/L (ref <8.0)

## 2024-02-01 LAB — SEDIMENTATION RATE: Sed Rate: 9 mm/h (ref 0–20)

## 2024-02-07 ENCOUNTER — Ambulatory Visit: Payer: Medicare PPO | Attending: Cardiology | Admitting: Cardiology

## 2024-02-07 ENCOUNTER — Encounter: Payer: Self-pay | Admitting: Cardiology

## 2024-02-07 VITALS — BP 130/80 | HR 96 | Ht 65.0 in | Wt 240.0 lb

## 2024-02-07 DIAGNOSIS — I4819 Other persistent atrial fibrillation: Secondary | ICD-10-CM | POA: Diagnosis not present

## 2024-02-07 DIAGNOSIS — I4892 Unspecified atrial flutter: Secondary | ICD-10-CM | POA: Diagnosis not present

## 2024-02-07 DIAGNOSIS — D6869 Other thrombophilia: Secondary | ICD-10-CM

## 2024-02-07 DIAGNOSIS — R600 Localized edema: Secondary | ICD-10-CM | POA: Diagnosis not present

## 2024-02-07 NOTE — Patient Instructions (Signed)
 Medication Instructions:  Your physician recommends that you continue on your current medications as directed. Please refer to the Current Medication list given to you today.  *If you need a refill on your cardiac medications before your next appointment, please call your pharmacy*   Follow-Up: At [facility], you and your health needs are our priority.  As part of our continuing mission to provide you with exceptional heart care, we have created designated Provider Care Teams.  These Care Teams include your primary Cardiologist (physician) and Advanced Practice Providers (APPs -  Physician Assistants and Nurse Practitioners) who all work together to provide you with the care you need, when you need it.  Your next appointment:   As needed with Dr. Jimmey Ralph

## 2024-02-07 NOTE — Progress Notes (Signed)
 Electrophysiology Office Note:   Date:  02/07/2024  ID:  Michael Hill, DOB 02-Apr-1951, MRN [id number]  Primary Cardiologist: [name], MD Electrophysiologist: Nobie Putnam, MD      History of Present Illness:   Michael Hill is a 73 y.o. male with h/o SVT, aortic atherosclerosis, coronary artery calcification, hyperlipidemia, spinal stenosis and lumbar radiculopathy status post L5-S1 decompression and fusion on August 25, 2021 c/b infection of his surgical site and MSSA bacteremia, and atrial flutter who is being seen today for follow up of atrial fibrillation/flutter.  Discussed the use of AI scribe software for clinical note transcription with the patient, who gave verbal consent to proceed.  History of Present Illness   The patient reports feeling fatigued since his surgeries and is currently experiencing swelling in the right leg. He had an ultrasound of RLE which showed no clot. The patient attributes the fatigue to the multitude of medications he is taking, including antibiotics, and a lack of physical activity over the years while recovering from his surgeries. He plans to start using an exercise bike again. The patient also reports an inguinal hernia and is considering surgery to address it. The patient suspects he has had atrial fibrillation for many years. The patient is currently on diltiazem to control his heart rate and anticoagulation therapy. The patient is unsure how much of his symptoms are attributable to his arrhythmia and is hesitant to undergo cardioversion or other procedures.     Review of systems complete and found to be negative unless listed in HPI.   EP Information / Studies Reviewed:    EKG is ordered today. Personal review as below.  EKG Interpretation Date/Time:  Wednesday February 07 2024 15:09:13 EST Ventricular Rate:  96 PR Interval:    QRS Duration:  84 QT Interval:  370 QTC Calculation: 467 R Axis:   54  Text Interpretation: Atrial  fibrillation When compared with ECG of 06-Nov-2023 14:50,  No significant change, previous ECG likely coarse atrial fibrillation. Confirmed by Nobie Putnam [phone]) on 02/07/2024 9:28:14 PM   EKG 09/26/23:    Echo 10/25/23:   1. Significant beat to beat variability due to underlying atrial  arrhythmia.   2. Left ventricular ejection fraction, by estimation, is 55%. The left  ventricle has low normal function. The left ventricle has no regional wall  motion abnormalities. There is mild concentric left ventricular  hypertrophy.   3. Right ventricular systolic function is normal. The right ventricular  size is mildly enlarged.   4. The mitral valve is normal in structure. Trivial mitral valve  regurgitation. No evidence of mitral stenosis.   5. The aortic valve is normal in structure. Aortic valve regurgitation is  not visualized. No aortic stenosis is present.   6. Aortic dilatation noted. There is dilatation of the ascending aorta,  measuring 41 mm.   7. The inferior vena cava is normal in size with greater than 50%  respiratory variability, suggesting right atrial pressure of 3 mmHg.   8. Left atrial size was moderately dilated.   9. Right atrial size was mild to moderately dilated.   Zio 09/2022:   Risk Assessment/Calculations:    CHA2DS2-VASc Score = 1   This indicates a 0.6% annual risk of stroke. The patient's score is based upon: CHF History: 0 HTN History: 0 Diabetes History: 0 Stroke History: 0 Vascular Disease History: 0 Age Score: 1 Gender Score: 0  Physical Exam:   VS:  BP 130/80 (BP Location: Left Arm, Patient Position: Sitting, Cuff Size: Large)   Pulse 96   Ht 5\' 5"  (1.651 m)   Wt 240 lb (108.9 kg)   SpO2 95%   BMI 39.94 kg/m    Wt Readings from Last 3 Encounters:  02/07/24 240 lb (108.9 kg)  11/06/23 236 lb (107 kg)  09/26/23 228 lb 9.6 oz (103.7 kg)     GEN: Well nourished, well developed in no acute distress NECK: No  JVD. CARDIAC: Normal rate, regular rhythm. RESPIRATORY:  Clear to auscultation without rales, wheezing or rhonchi  ABDOMEN: Soft, non-distended EXTREMITIES: 2+ edema; No deformity   ASSESSMENT AND PLAN:   [name] is a 73 y.o. male with h/o SVT, aortic atherosclerosis, coronary artery calcification, hyperlipidemia, spinal stenosis and lumbar radiculopathy status post L5-S1 decompression and fusion on August 25, 2022 c/b infection of his surgical site and MSSA bacteremia, and atrial flutter who is being seen today for follow up evaluation of atrial fibrillation/flutter.  #. Persistent Atrial fibrillation/flutter: Unclear onset/duration. Appears to have been in atrial fibrillation/flutter consistently since at least November of 2024. Last ECG showing sinus rhythm is from 2022.  He is unsure of the severity of his symptoms, given that his activity level has declined significantly since his back surgery. We discussed trial of sinus rhythm to assess for symptomatic improvement. He would be unlikely to maintain sinus for very long without anti-arrhythmic. Patient is currently not interested in this.  - Continue rate control with diltiazem.  #. Secondary hypercoagulable state due to atrial fibrillation: CHADSVASC score of 1.  - Continue Eliquis.   #. Lower extremity edema, R > L: Possibly related to diastolic heart failure in setting of atrial fibrillation/flutter. - Recently had ultrasound of R leg with no evidence of DVT.  - Proposed starting lasix or changing diltiazem to metoprolol. He would like to wait until his visit with Dr. Lynnette Caffey to discuss this.  Follow up with Dr. Jimmey Ralph as needed.      Signed, Nobie Putnam, MD

## 2024-02-12 ENCOUNTER — Ambulatory Visit: Payer: Medicare PPO | Admitting: Internal Medicine

## 2024-03-27 DIAGNOSIS — F1120 Opioid dependence, uncomplicated: Secondary | ICD-10-CM | POA: Diagnosis not present

## 2024-03-27 DIAGNOSIS — M961 Postlaminectomy syndrome, not elsewhere classified: Secondary | ICD-10-CM | POA: Diagnosis not present

## 2024-04-09 ENCOUNTER — Ambulatory Visit: Admitting: Physician Assistant

## 2024-05-10 ENCOUNTER — Other Ambulatory Visit: Payer: Self-pay | Admitting: Cardiology

## 2024-05-10 DIAGNOSIS — I4892 Unspecified atrial flutter: Secondary | ICD-10-CM

## 2024-06-25 DIAGNOSIS — M961 Postlaminectomy syndrome, not elsewhere classified: Secondary | ICD-10-CM | POA: Diagnosis not present

## 2024-06-25 DIAGNOSIS — F1120 Opioid dependence, uncomplicated: Secondary | ICD-10-CM | POA: Diagnosis not present

## 2024-08-02 ENCOUNTER — Other Ambulatory Visit: Payer: Self-pay | Admitting: Infectious Disease

## 2024-08-02 DIAGNOSIS — T847XXD Infection and inflammatory reaction due to other internal orthopedic prosthetic devices, implants and grafts, subsequent encounter: Secondary | ICD-10-CM

## 2024-08-06 ENCOUNTER — Other Ambulatory Visit: Payer: Self-pay | Admitting: Internal Medicine

## 2024-08-06 ENCOUNTER — Other Ambulatory Visit: Payer: Self-pay | Admitting: Infectious Disease

## 2024-08-06 DIAGNOSIS — T847XXD Infection and inflammatory reaction due to other internal orthopedic prosthetic devices, implants and grafts, subsequent encounter: Secondary | ICD-10-CM

## 2024-08-27 ENCOUNTER — Other Ambulatory Visit: Payer: Self-pay | Admitting: Infectious Disease

## 2024-08-27 DIAGNOSIS — T847XXD Infection and inflammatory reaction due to other internal orthopedic prosthetic devices, implants and grafts, subsequent encounter: Secondary | ICD-10-CM

## 2024-08-27 NOTE — Telephone Encounter (Signed)
 Needs appt. MyChart sent and placed on scheduling list.

## 2024-08-29 ENCOUNTER — Telehealth: Payer: Self-pay | Admitting: Infectious Disease

## 2024-08-29 NOTE — Telephone Encounter (Signed)
 [facility] called to schedule an appt for medication refill. Pt is scheduled 10/13 with Dr. Fleeta Rothman.

## 2024-08-30 NOTE — Telephone Encounter (Signed)
 Refill sent.   Enzley Kitchens, BSN, RN

## 2024-09-05 DIAGNOSIS — E782 Mixed hyperlipidemia: Secondary | ICD-10-CM | POA: Diagnosis not present

## 2024-09-05 DIAGNOSIS — I4892 Unspecified atrial flutter: Secondary | ICD-10-CM | POA: Diagnosis not present

## 2024-09-05 DIAGNOSIS — I10 Essential (primary) hypertension: Secondary | ICD-10-CM | POA: Diagnosis not present

## 2024-09-05 DIAGNOSIS — R7303 Prediabetes: Secondary | ICD-10-CM | POA: Diagnosis not present

## 2024-09-05 DIAGNOSIS — M545 Low back pain, unspecified: Secondary | ICD-10-CM | POA: Diagnosis not present

## 2024-09-05 DIAGNOSIS — T847XXD Infection and inflammatory reaction due to other internal orthopedic prosthetic devices, implants and grafts, subsequent encounter: Secondary | ICD-10-CM | POA: Diagnosis not present

## 2024-09-05 DIAGNOSIS — Z0000 Encounter for general adult medical examination without abnormal findings: Secondary | ICD-10-CM | POA: Diagnosis not present

## 2024-09-05 DIAGNOSIS — Z6838 Body mass index (BMI) 38.0-38.9, adult: Secondary | ICD-10-CM | POA: Diagnosis not present

## 2024-09-15 ENCOUNTER — Encounter: Payer: Self-pay | Admitting: Infectious Disease

## 2024-09-15 DIAGNOSIS — M4640 Discitis, unspecified, site unspecified: Secondary | ICD-10-CM | POA: Insufficient documentation

## 2024-09-15 NOTE — Progress Notes (Unsigned)
 Subjective:  Chief Complaint: followup for hardware associated diskitis, vertebral osteomyelitis   Patient ID: Michael Hill, male    DOB: 1951/04/14, 73 y.o.   MRN: [id number]  HPI  Past Medical History:  Diagnosis Date   Arthritis    Atrial flutter (HCC) 01/05/2022   Bilateral hand numbness 04/18/2022   Chronic back pain    scoliosis/stenosis/spondylosis   Diskitis 09/15/2024   Herpes zoster    History of bronchitis    many yrs ago   History of elevated PSA    History of shingles    Hypercholesterolemia    Hyperlipidemia    takes Lipitor daily   Hypertension    takes Lotrel daily   Lower extremity edema 10/22/2021   Osteoarthritis, knee 08/01/2023   Prediabetes    Severe obesity (HCC)    Vertebral osteomyelitis (HCC) 08/01/2023    Past Surgical History:  Procedure Laterality Date   ABDOMINAL EXPOSURE N/A 08/05/2014   Procedure: ABDOMINAL EXPOSURE;  Surgeon: Lonni GORMAN Blade, MD;  Location: [facility];  Service: Vascular;  Laterality: N/A;   ANTERIOR LAT LUMBAR FUSION Right 08/05/2014   Procedure: Right Lumbar two-three,Lumbar three-four,Lumbar four-five Anterior lateral lumbar interbody fusion;  Surgeon: Fairy Levels, MD;  Location: [facility];  Service: Neurosurgery;  Laterality: Right;  right    ANTERIOR LUMBAR FUSION N/A 08/05/2014   Procedure: Lumbar five-Sacral-One Anterior lumbar interbody fusion with Dr. Blade for approach; Right Lumbar two-three,Lumbar three-four,Lumbar four-five Anterior lateral lumbar interbody fusion;  Surgeon: Fairy Levels, MD;  Location: [facility];  Service: Neurosurgery;  Laterality: N/A;   APPLICATION OF INTRAOPERATIVE CT SCAN N/A 08/24/2021   Procedure: APPLICATION OF INTRAOPERATIVE CT SCAN;  Surgeon: Levels Fairy, MD;  Location: [facility] OR;  Service: Neurosurgery;  Laterality: N/A;   APPLICATION OF WOUND VAC N/A 09/24/2021   Procedure: APPLICATION OF WOUND VAC;  Surgeon: Dawley, Lani BROCKS, DO;  Location: [facility] OR;  Service:  Neurosurgery;  Laterality: N/A;   COLONOSCOPY     EPIDURAL BLOCK INJECTION     KNEE ARTHROSCOPY Bilateral    x 4    LUMBAR FUSION  08/05/2014        DR STERN   LUMBAR PERCUTANEOUS PEDICLE SCREW 3 LEVEL N/A 08/07/2014   Procedure: Stage II Lumbar percutaneous pedicle screw placement L2-S1;  Surgeon: Fairy Levels, MD;  Location: [facility];  Service: Neurosurgery;  Laterality: N/A;  Stage II Lumbar percutaneous pedicle screw placement L2-S1   LUMBAR WOUND DEBRIDEMENT N/A 09/08/2021   Procedure: Incision and drainage of lumbar wound;  Surgeon: Levels Fairy, MD;  Location: [facility] OR;  Service: Neurosurgery;  Laterality: N/A;   PROSTATE BIOPSY     ROTATOR CUFF REPAIR Right    TEE WITHOUT CARDIOVERSION N/A 09/14/2021   Procedure: TRANSESOPHAGEAL ECHOCARDIOGRAM (TEE);  Surgeon: Pietro Redell GORMAN, MD;  Location: [facility] ENDOS[facility];  Service: Cardiovascular;  Laterality: N/A;   WOUND EXPLORATION N/A 09/24/2021   Procedure: Lumbar Wound Irrigation and Debridement.;  Surgeon: Carollee Lani BROCKS, DO;  Location: [facility] OR;  Service: Neurosurgery;  Laterality: N/A;  Wound appplied    Family History  Problem Relation Age of Onset   Heart disease Father        before age 93   Hyperlipidemia Father    Hypertension Father    Hypertension Brother    Heart disease Brother        before age 32   Cancer Daughter       Social History   Socioeconomic History  Marital status: Married    Spouse name: Not on file   Number of children: Not on file   Years of education: Not on file   Highest education level: Not on file  Occupational History   Not on file  Tobacco Use   Smoking status: Never   Smokeless tobacco: Never  Vaping Use   Vaping status: Never Used  Substance and Sexual Activity   Alcohol use: No   Drug use: No   Sexual activity: Not Currently  Other Topics Concern   Not on file  Social History Narrative   Not on file   Social Drivers of Health   Financial Resource Strain: Not on file  Food  Insecurity: Not on file  Transportation Needs: Not on file  Physical Activity: Not on file  Stress: Not on file  Social Connections: Not on file    No Known Allergies   Current Outpatient Medications:    acetaminophen  (TYLENOL ) 325 MG tablet, Take 325 mg by mouth every 6 (six) hours as needed for moderate pain, headache or mild pain., Disp: , Rfl:    atorvastatin  (LIPITOR) 20 MG tablet, Take 20 mg by mouth daily., Disp: , Rfl:    cefadroxil  (DURICEF) 500 MG capsule, Take 1 capsule (500 mg total) by mouth 2 (two) times daily., Disp: 60 capsule, Rfl: 0   diltiazem  (CARDIZEM  CD) 120 MG 24 hr capsule, Take 1 capsule (120 mg total) by mouth at bedtime. Please call [phone] to schedule an appointment with Dr. Lurena Red for future refills. Thank you. 1st attempt., Disp: 30 capsule, Rfl: 0   ELIQUIS  5 MG TABS tablet, TAKE 1 TABLET BY MOUTH TWICE A DAY, Disp: 60 tablet, Rfl: 5   oxyCODONE  (OXY IR/ROXICODONE ) 5 MG immediate release tablet, Take 1-2 tablets (5-10 mg total) by mouth every 3 (three) hours as needed for moderate pain., Disp: 30 tablet, Rfl: 0   Review of Systems     Objective:   Physical Exam        Assessment & Plan:

## 2024-09-16 ENCOUNTER — Encounter: Payer: Self-pay | Admitting: Infectious Disease

## 2024-09-16 ENCOUNTER — Ambulatory Visit: Admitting: Infectious Disease

## 2024-09-16 ENCOUNTER — Other Ambulatory Visit: Payer: Self-pay

## 2024-09-16 VITALS — BP 145/81 | HR 80 | Temp 97.8°F | Wt 226.0 lb

## 2024-09-16 DIAGNOSIS — R7881 Bacteremia: Secondary | ICD-10-CM

## 2024-09-16 DIAGNOSIS — M4620 Osteomyelitis of vertebra, site unspecified: Secondary | ICD-10-CM

## 2024-09-16 DIAGNOSIS — M172 Bilateral post-traumatic osteoarthritis of knee: Secondary | ICD-10-CM | POA: Diagnosis not present

## 2024-09-16 DIAGNOSIS — M4626 Osteomyelitis of vertebra, lumbar region: Secondary | ICD-10-CM | POA: Diagnosis not present

## 2024-09-16 DIAGNOSIS — M4636 Infection of intervertebral disc (pyogenic), lumbar region: Secondary | ICD-10-CM

## 2024-09-16 DIAGNOSIS — B9561 Methicillin susceptible Staphylococcus aureus infection as the cause of diseases classified elsewhere: Secondary | ICD-10-CM

## 2024-09-16 DIAGNOSIS — T847XXD Infection and inflammatory reaction due to other internal orthopedic prosthetic devices, implants and grafts, subsequent encounter: Secondary | ICD-10-CM | POA: Diagnosis not present

## 2024-09-16 DIAGNOSIS — M4640 Discitis, unspecified, site unspecified: Secondary | ICD-10-CM | POA: Diagnosis not present

## 2024-09-16 DIAGNOSIS — R21 Rash and other nonspecific skin eruption: Secondary | ICD-10-CM

## 2024-09-16 MED ORDER — CEFADROXIL 500 MG PO CAPS: 500.0000 mg | ORAL_CAPSULE | Freq: Two times a day (BID) | ORAL | 0 refills | Status: DC

## 2024-09-17 LAB — CBC WITH DIFFERENTIAL/PLATELET
Absolute Lymphocytes: 1473 {cells}/uL (ref 850–3900)
Absolute Monocytes: 381 {cells}/uL (ref 200–950)
Basophils Absolute: 39 {cells}/uL (ref 0–200)
Basophils Relative: 0.7 %
Eosinophils Absolute: 118 {cells}/uL (ref 15–500)
Eosinophils Relative: 2.1 %
HCT: 42 % (ref 38.5–50.0)
Hemoglobin: 14 g/dL (ref 13.2–17.1)
MCH: 31 pg (ref 27.0–33.0)
MCHC: 33.3 g/dL (ref 32.0–36.0)
MCV: 93.1 fL (ref 80.0–100.0)
MPV: 11.6 fL (ref 7.5–12.5)
Monocytes Relative: 6.8 %
Neutro Abs: 3590 {cells}/uL (ref 1500–7800)
Neutrophils Relative %: 64.1 %
Platelets: 147 Thousand/uL (ref 140–400)
RBC: 4.51 Million/uL (ref 4.20–5.80)
RDW: 12.8 % (ref 11.0–15.0)
Total Lymphocyte: 26.3 %
WBC: 5.6 Thousand/uL (ref 3.8–10.8)

## 2024-09-17 LAB — BASIC METABOLIC PANEL WITHOUT GFR
BUN: 22 mg/dL (ref 7–25)
CO2: 30 mmol/L (ref 20–32)
Calcium: 9.2 mg/dL (ref 8.6–10.3)
Chloride: 103 mmol/L (ref 98–110)
Creat: 0.92 mg/dL (ref 0.70–1.28)
Glucose, Bld: 113 mg/dL — ABNORMAL HIGH (ref 65–99)
Potassium: 4.9 mmol/L (ref 3.5–5.3)
Sodium: 138 mmol/L (ref 135–146)

## 2024-09-17 LAB — SEDIMENTATION RATE: Sed Rate: 6 mm/h (ref 0–20)

## 2024-09-17 LAB — C-REACTIVE PROTEIN: CRP: <3 mg/L (ref <8.0)

## 2024-10-02 DIAGNOSIS — M961 Postlaminectomy syndrome, not elsewhere classified: Secondary | ICD-10-CM | POA: Diagnosis not present

## 2024-10-02 DIAGNOSIS — F1120 Opioid dependence, uncomplicated: Secondary | ICD-10-CM | POA: Diagnosis not present

## 2024-10-22 ENCOUNTER — Other Ambulatory Visit: Payer: Self-pay | Admitting: Internal Medicine

## 2024-10-28 NOTE — Progress Notes (Unsigned)
 Cardiology Office Note:   Date:  10/30/2024  ID:  Michael Hill, DOB 10/11/1951, MRN [id number] PCP:  Seabron Lenis, MD  [facility] Providers Cardiologist:  Wendel Haws, MD Referring MD: Seabron Lenis, MD  Chief Complaint/Reason for Referral: Cardiology follow-up ASSESSMENT:    1. Persistent atrial fibrillation (HCC)   2. Secondary hypercoagulable state   3. Coronary artery calcification seen on CAT scan   4. SVT (supraventricular tachycardia)   5. Hyperlipidemia LDL goal <70   6. Aortic atherosclerosis      PLAN:   In order of problems listed above: Persistent atrial fibrillation: Asymptomatic.  Continue Eliquis  5 mg twice daily, Cardizem  120 mg daily.  Followed by EP. Secondary hypercoagulable state: Continue Eliquis  5 mg twice daily Coronary artery calcification: Continue Eliquis  5 mg twice daily, atorvastatin  20 mg daily SVT: Continue diltiazem  120 mg daily Hyperlipidemia: Continue atorvastatin  20 mg daily, check lipid panel, LFTs  today Aortic atherosclerosis: Continue Eliquis  5 mg twice daily, atorvastatin  20 mg daily         Dispo:  Return in about 6 months (around 04/28/2025).      Medication Adjustments/Labs and Tests Ordered: Current medicines are reviewed at length with the patient today.  Concerns regarding medicines are outlined above.  The following changes have been made:     Labs/tests ordered: Orders Placed This Encounter  Procedures   Lipid panel   Hepatic function panel    Medication Changes: Meds ordered this encounter  Medications   diltiazem  (CARDIZEM  CD) 120 MG 24 hr capsule    Sig: Take 1 capsule (120 mg total) by mouth at bedtime.    Dispense:  90 capsule    Refill:  3    Current medicines are reviewed at length with the patient today.  The patient does not have concerns regarding medicines.  I spent 35 minutes reviewing all clinical data during and prior to this visit including all relevant imaging studies, laboratories,  clinical information from other health systems, and prior notes from both Cardiology and other specialties, interviewing the patient, and conducting a complete physical examination in order to formulate a comprehensive and personalized evaluation and treatment plan.     History of Present Illness:   FOCUSED PROBLEM LIST:   Supraventricular tachycardia seen during complicated hospitalization for sepsis due to surgical site wound infection Monitor December 2022 atrial flutter and supraventricular tachycardia  Monitor 2023 with occasional SVT and PVCs Coronary artery calcification CT 2022 Aortic atherosclerosis CT lumbar spine 2017 Hyperlipidemia LP(a) 53.6 BMI 38 Persistent atrial fibrillation CV 2 score of 04 November 2021: Seen after long hospitalization with sepsis and supraventricular tachycardia.  Monitor was placed which demonstrated SVT and atrial flutter.  He was started on diltiazem  and Eliquis .   January 2023: Telehealth visit: The patient was doing well and continue with more physical therapy.   April 2023:    We had a televisit the last time I corresponded with him.  He was doing well.  He was started on diltiazem  and Eliquis .  He is getting stronger and participating with physical therapy.  He feels relatively well.  He denies any severe bleeding or bruising, palpitations, presyncope, or syncope.  His peripheral edema is much improved.  He is required no emergency room visits or hospitalizations.  Plan.  Continue medical therapy; consider EP referral as patient gets stronger.   October 2023: The patient is doing fairly well.  He denies any significant cardiovascular complaints.  He has had no palpitations, paroxysmal  nocturnal dyspnea, orthopnea.  He is not able to do as much as he did several years ago and he attributes this to old age.  He has had no significant bleeding or bruising on Eliquis .  He is required no hospitalizations or emergency room visits.  He feels relatively  well.  Plan: Obtain monitor for atrial flutter.  Check LP(a).  October 2024: His LP(a) was not elevated.  A monitor demonstrated occasional PACs, PVCs and no atrial flutter or atrial fibrillation.  He has gained about 20 pounds from not exercising and dietary indiscretion.  He has problems with osteoarthritis of his knees and is very reluctant given the issues encountered with his back surgery to undergo any orthopedic surgeries.  He by his own admission is fatigued and deconditioned.  He denies any chest pain however.  He has had occasional palpitations but nothing regular.  He has had no severe bleeding or bruising while on Eliquis .  He has had no presyncope or syncope.  He has had some mild swelling in his lower extremities which he attributes to eating salty foods.  Plan:, Refer to EP, obtain echocardiogram to evaluate LV function.  November 2025:  Patient consents to use of AI scribe. Echocardiogram demonstrated normal LV function.  He was seen by EP who believed he had atrial fibrillation.  He is maintained on medical therapy.  He is currently asymptomatic with no episodes of palpitations or arrhythmias. He is on Eliquis  and has not experienced significant bleeding issues, although minor cuts bleed more than usual. He has been on this medication regimen since his surgery three years ago, which was complicated by sepsis.  He experiences chronic knee pain, which he discussed with his infectious disease specialist. He wears bands around his knees to help with pain and swelling, which is localized around the knees. No lightheadedness, blacking out spells, or significant leg swelling since his surgery.         Current Medications: Current Meds  Medication Sig   acetaminophen  (TYLENOL ) 325 MG tablet Take 325 mg by mouth every 6 (six) hours as needed for moderate pain, headache or mild pain.   atorvastatin  (LIPITOR) 20 MG tablet Take 20 mg by mouth daily.   cefadroxil  (DURICEF) 500 MG capsule Take 1  capsule (500 mg total) by mouth 2 (two) times daily.   ELIQUIS  5 MG TABS tablet TAKE 1 TABLET BY MOUTH TWICE A DAY   oxyCODONE  (OXY IR/ROXICODONE ) 5 MG immediate release tablet Take 1-2 tablets (5-10 mg total) by mouth every 3 (three) hours as needed for moderate pain.   [DISCONTINUED] diltiazem  (CARDIZEM  CD) 120 MG 24 hr capsule Take 1 capsule (120 mg total) by mouth at bedtime.     Review of Systems:   Please see the history of present illness.    All other systems reviewed and are negative.     EKGs/Labs/Other Test Reviewed:   EKG: 2025 atrial fibrillation  EKG Interpretation Date/Time:    Ventricular Rate:    PR Interval:    QRS Duration:    QT Interval:    QTC Calculation:   R Axis:      Text Interpretation:          CARDIAC STUDIES: Refer to CV Procedures and Imaging Tabs   Risk Assessment/Calculations:    CHA2DS2-VASc Score = 1   This indicates a 0.6% annual risk of stroke. The patient's score is based upon: CHF History: 0 HTN History: 0 Diabetes History: 0 Stroke History: 0 Vascular Disease History:  0 Age Score: 1 Gender Score: 0          Physical Exam:   VS:  BP 120/80 (BP Location: Left Arm, Patient Position: Sitting, Cuff Size: Normal)   Pulse 77   Ht 5' 5 (1.651 m)   Wt 227 lb (103 kg)   SpO2 95%   BMI 37.77 kg/m        Wt Readings from Last 3 Encounters:  10/29/24 227 lb (103 kg)  09/16/24 226 lb (102.5 kg)  02/07/24 240 lb (108.9 kg)      GENERAL:  No apparent distress, AOx3 HEENT:  No carotid bruits, +2 carotid impulses, no scleral icterus CAR: Irregular RR no murmurs, gallops, rubs, or thrills RES:  Clear to auscultation bilaterally ABD:  Soft, nontender, nondistended, positive bowel sounds x 4 VASC:  +2 radial pulses, +2 carotid pulses NEURO:  CN 2-12 grossly intact; motor and sensory grossly intact PSYCH:  No active depression or anxiety EXT:  No edema, ecchymosis, or cyanosis  Signed, Maurene Hollin K Theordore Cisnero, MD  10/30/2024 7:01  AM    [facility] [address] [place], [place], [place]  [address] Phone: [phone]; Fax: [phone]   Note:  This document was prepared using Dragon voice recognition software and may include unintentional dictation errors.

## 2024-10-29 ENCOUNTER — Ambulatory Visit: Attending: Internal Medicine | Admitting: Internal Medicine

## 2024-10-29 ENCOUNTER — Encounter: Payer: Self-pay | Admitting: Internal Medicine

## 2024-10-29 VITALS — BP 120/80 | HR 77 | Ht 65.0 in | Wt 227.0 lb

## 2024-10-29 DIAGNOSIS — D6869 Other thrombophilia: Secondary | ICD-10-CM

## 2024-10-29 DIAGNOSIS — I2510 Atherosclerotic heart disease of native coronary artery without angina pectoris: Secondary | ICD-10-CM | POA: Diagnosis not present

## 2024-10-29 DIAGNOSIS — I4819 Other persistent atrial fibrillation: Secondary | ICD-10-CM | POA: Diagnosis not present

## 2024-10-29 DIAGNOSIS — E785 Hyperlipidemia, unspecified: Secondary | ICD-10-CM

## 2024-10-29 DIAGNOSIS — I471 Supraventricular tachycardia, unspecified: Secondary | ICD-10-CM

## 2024-10-29 DIAGNOSIS — I700 Atherosclerosis of aorta: Secondary | ICD-10-CM | POA: Diagnosis not present

## 2024-10-29 MED ORDER — DILTIAZEM HCL ER COATED BEADS 120 MG PO CP24: 120.0000 mg | ORAL_CAPSULE | Freq: Every day | ORAL | 3 refills | Status: AC

## 2024-10-29 NOTE — Patient Instructions (Signed)
 Medication Instructions:  Refilled diltiazem    *If you need a refill on your cardiac medications before your next appointment, please call your pharmacy*  Lab Work: Lipid panel LFT  If you have labs (blood work) drawn today and your tests are completely normal, you will receive your results only by: MyChart Message (if you have MyChart) OR A paper copy in the mail If you have any lab test that is abnormal or we need to change your treatment, we will call you to review the results.  Follow-Up: At [facility], you and your health needs are our priority.  As part of our continuing mission to provide you with exceptional heart care, our providers are all part of one team.  This team includes your primary Cardiologist (physician) and Advanced Practice Providers or APPs (Physician Assistants and Nurse Practitioners) who all work together to provide you with the care you need, when you need it.  Your next appointment:   6 month(s)  Provider:   Arun K Thukkani, MD

## 2024-10-30 ENCOUNTER — Other Ambulatory Visit: Payer: Self-pay | Admitting: Infectious Disease

## 2024-10-30 ENCOUNTER — Ambulatory Visit: Payer: Self-pay | Admitting: Internal Medicine

## 2024-10-30 DIAGNOSIS — T847XXD Infection and inflammatory reaction due to other internal orthopedic prosthetic devices, implants and grafts, subsequent encounter: Secondary | ICD-10-CM

## 2024-10-30 LAB — HEPATIC FUNCTION PANEL
ALT: 27 IU/L (ref 0–44)
AST: 27 IU/L (ref 0–40)
Albumin: 3.9 g/dL (ref 3.8–4.8)
Alkaline Phosphatase: 93 IU/L (ref 47–123)
Bilirubin Total: 0.7 mg/dL (ref 0.0–1.2)
Bilirubin, Direct: 0.24 mg/dL (ref 0.00–0.40)
Total Protein: 6 g/dL (ref 6.0–8.5)

## 2024-10-30 LAB — LIPID PANEL
Chol/HDL Ratio: 2.2 ratio (ref 0.0–5.0)
Cholesterol, Total: 123 mg/dL (ref 100–199)
HDL: 57 mg/dL (ref >39)
LDL Chol Calc (NIH): 53 mg/dL (ref 0–99)
Triglycerides: 60 mg/dL (ref 0–149)
VLDL Cholesterol Cal: 13 mg/dL (ref 5–40)

## 2024-11-21 ENCOUNTER — Other Ambulatory Visit: Payer: Self-pay | Admitting: Cardiology

## 2024-11-21 DIAGNOSIS — I4892 Unspecified atrial flutter: Secondary | ICD-10-CM

## 2024-11-21 MED ORDER — APIXABAN 5 MG PO TABS: 5.0000 mg | ORAL_TABLET | Freq: Two times a day (BID) | ORAL | 5 refills | Status: AC

## 2024-11-21 NOTE — Telephone Encounter (Signed)
 Prescription refill request for Eliquis  received. Indication:afib Last office visit:11/25 Scr: 0.92  10/25 Age:73 Weight:103  kg  Prescription refilled
